# Patient Record
Sex: Male | Born: 1935 | Race: White | Hispanic: No | Marital: Married | Smoking: Former smoker
Health system: Southern US, Community
[De-identification: ages and names within clinical notes are randomized; demographics above are authoritative.]

## PROBLEM LIST (undated history)

## (undated) DIAGNOSIS — I10 Essential (primary) hypertension: Secondary | ICD-10-CM

## (undated) DIAGNOSIS — I255 Ischemic cardiomyopathy: Secondary | ICD-10-CM

## (undated) DIAGNOSIS — I472 Ventricular tachycardia, unspecified: Secondary | ICD-10-CM

## (undated) DIAGNOSIS — I4891 Unspecified atrial fibrillation: Secondary | ICD-10-CM

## (undated) DIAGNOSIS — E785 Hyperlipidemia, unspecified: Secondary | ICD-10-CM

## (undated) DIAGNOSIS — R5383 Other fatigue: Secondary | ICD-10-CM

## (undated) DIAGNOSIS — Z9889 Other specified postprocedural states: Secondary | ICD-10-CM

## (undated) DIAGNOSIS — I82409 Acute embolism and thrombosis of unspecified deep veins of unspecified lower extremity: Secondary | ICD-10-CM

## (undated) DIAGNOSIS — I2510 Atherosclerotic heart disease of native coronary artery without angina pectoris: Secondary | ICD-10-CM

## (undated) DIAGNOSIS — M1990 Unspecified osteoarthritis, unspecified site: Secondary | ICD-10-CM

## (undated) DIAGNOSIS — Z95810 Presence of automatic (implantable) cardiac defibrillator: Secondary | ICD-10-CM

## (undated) HISTORY — DX: Atherosclerotic heart disease of native coronary artery without angina pectoris: I25.10

## (undated) HISTORY — DX: Other specified postprocedural states: Z98.890

## (undated) HISTORY — DX: Unspecified atrial fibrillation: I48.91

## (undated) HISTORY — DX: Hyperlipidemia, unspecified: E78.5

## (undated) HISTORY — DX: Ventricular tachycardia: I47.2

## (undated) HISTORY — DX: Ventricular tachycardia, unspecified: I47.20

## (undated) HISTORY — DX: Other fatigue: R53.83

## (undated) HISTORY — DX: Ischemic cardiomyopathy: I25.5

## (undated) HISTORY — DX: Essential (primary) hypertension: I10

## (undated) HISTORY — DX: Presence of automatic (implantable) cardiac defibrillator: Z95.810

## (undated) HISTORY — DX: Acute embolism and thrombosis of unspecified deep veins of unspecified lower extremity: I82.409

---

## 1994-05-23 HISTORY — PX: CORONARY ARTERY BYPASS GRAFT: SHX141

## 1995-05-24 HISTORY — PX: PTCA: SHX146

## 2002-10-14 DIAGNOSIS — Z9889 Other specified postprocedural states: Secondary | ICD-10-CM

## 2002-10-14 HISTORY — DX: Other specified postprocedural states: Z98.890

## 2004-04-21 ENCOUNTER — Ambulatory Visit: Payer: Self-pay | Admitting: Cardiology

## 2004-05-05 ENCOUNTER — Ambulatory Visit: Payer: Self-pay

## 2004-11-22 ENCOUNTER — Ambulatory Visit: Payer: Self-pay | Admitting: Internal Medicine

## 2005-03-11 ENCOUNTER — Ambulatory Visit: Payer: Self-pay | Admitting: Cardiology

## 2005-03-25 ENCOUNTER — Ambulatory Visit: Payer: Self-pay

## 2005-07-27 ENCOUNTER — Ambulatory Visit: Payer: Self-pay | Admitting: Internal Medicine

## 2005-08-01 ENCOUNTER — Ambulatory Visit: Payer: Self-pay | Admitting: Internal Medicine

## 2005-10-25 ENCOUNTER — Ambulatory Visit: Payer: Self-pay | Admitting: Internal Medicine

## 2006-01-20 ENCOUNTER — Ambulatory Visit: Payer: Self-pay | Admitting: Internal Medicine

## 2006-06-28 ENCOUNTER — Ambulatory Visit: Payer: Self-pay | Admitting: Internal Medicine

## 2006-07-22 DIAGNOSIS — Z95810 Presence of automatic (implantable) cardiac defibrillator: Secondary | ICD-10-CM

## 2006-07-22 HISTORY — DX: Presence of automatic (implantable) cardiac defibrillator: Z95.810

## 2006-07-22 HISTORY — PX: CARDIAC DEFIBRILLATOR PLACEMENT: SHX171

## 2006-08-03 ENCOUNTER — Ambulatory Visit: Payer: Self-pay | Admitting: Internal Medicine

## 2006-08-08 ENCOUNTER — Ambulatory Visit: Payer: Self-pay | Admitting: Cardiology

## 2006-08-13 ENCOUNTER — Encounter: Payer: Self-pay | Admitting: Internal Medicine

## 2006-08-13 ENCOUNTER — Ambulatory Visit: Payer: Self-pay | Admitting: Cardiology

## 2006-08-13 ENCOUNTER — Inpatient Hospital Stay (HOSPITAL_COMMUNITY): Admission: EM | Admit: 2006-08-13 | Discharge: 2006-08-16 | Payer: Self-pay | Admitting: Emergency Medicine

## 2006-09-04 ENCOUNTER — Inpatient Hospital Stay (HOSPITAL_COMMUNITY): Admission: AD | Admit: 2006-09-04 | Discharge: 2006-09-08 | Payer: Self-pay | Admitting: Cardiology

## 2006-09-04 ENCOUNTER — Ambulatory Visit: Payer: Self-pay | Admitting: Cardiology

## 2006-09-04 ENCOUNTER — Ambulatory Visit: Payer: Self-pay

## 2006-09-11 ENCOUNTER — Ambulatory Visit: Payer: Self-pay | Admitting: Cardiology

## 2006-09-18 ENCOUNTER — Ambulatory Visit: Payer: Self-pay | Admitting: Cardiology

## 2006-09-20 ENCOUNTER — Ambulatory Visit: Payer: Self-pay | Admitting: Internal Medicine

## 2006-09-25 ENCOUNTER — Ambulatory Visit: Payer: Self-pay | Admitting: Cardiovascular Disease

## 2006-10-02 ENCOUNTER — Ambulatory Visit: Payer: Self-pay | Admitting: Internal Medicine

## 2006-10-11 ENCOUNTER — Ambulatory Visit: Payer: Self-pay | Admitting: Cardiology

## 2006-10-25 ENCOUNTER — Ambulatory Visit: Payer: Self-pay | Admitting: Cardiology

## 2006-11-17 ENCOUNTER — Ambulatory Visit: Payer: Self-pay | Admitting: Cardiology

## 2006-11-22 ENCOUNTER — Ambulatory Visit: Payer: Self-pay | Admitting: Cardiology

## 2006-12-12 ENCOUNTER — Ambulatory Visit: Payer: Self-pay | Admitting: Internal Medicine

## 2007-01-02 ENCOUNTER — Ambulatory Visit: Payer: Self-pay | Admitting: Cardiology

## 2007-01-11 ENCOUNTER — Ambulatory Visit: Payer: Self-pay | Admitting: Cardiology

## 2007-01-18 ENCOUNTER — Ambulatory Visit: Payer: Self-pay | Admitting: Internal Medicine

## 2007-01-18 LAB — CONVERTED CEMR LAB
BUN: 29 mg/dL — ABNORMAL HIGH (ref 6–23)
CO2: 28 meq/L (ref 19–32)
Calcium: 9.6 mg/dL (ref 8.4–10.5)
Chloride: 103 meq/L (ref 96–112)
Creatinine, Ser: 1.1 mg/dL (ref 0.4–1.5)
GFR calc Af Amer: 85 mL/min
GFR calc non Af Amer: 70 mL/min
Glucose, Bld: 95 mg/dL (ref 70–99)
Magnesium: 2.3 mg/dL (ref 1.5–2.5)
Potassium: 4.6 meq/L (ref 3.5–5.1)
Sodium: 138 meq/L (ref 135–145)
TSH: 0.74 microintl units/mL (ref 0.35–5.50)

## 2007-02-05 ENCOUNTER — Encounter: Payer: Self-pay | Admitting: Internal Medicine

## 2007-02-05 DIAGNOSIS — I10 Essential (primary) hypertension: Secondary | ICD-10-CM

## 2007-02-05 DIAGNOSIS — E785 Hyperlipidemia, unspecified: Secondary | ICD-10-CM | POA: Insufficient documentation

## 2007-02-05 DIAGNOSIS — I82409 Acute embolism and thrombosis of unspecified deep veins of unspecified lower extremity: Secondary | ICD-10-CM | POA: Insufficient documentation

## 2007-02-08 ENCOUNTER — Ambulatory Visit: Payer: Self-pay | Admitting: Internal Medicine

## 2007-02-22 ENCOUNTER — Ambulatory Visit: Payer: Self-pay | Admitting: Cardiology

## 2007-03-08 ENCOUNTER — Ambulatory Visit: Payer: Self-pay | Admitting: Cardiology

## 2007-03-26 ENCOUNTER — Ambulatory Visit: Payer: Self-pay | Admitting: Cardiovascular Disease

## 2007-03-26 ENCOUNTER — Ambulatory Visit: Payer: Self-pay

## 2007-04-12 ENCOUNTER — Ambulatory Visit: Payer: Self-pay | Admitting: Cardiology

## 2007-04-23 ENCOUNTER — Ambulatory Visit: Payer: Self-pay | Admitting: Cardiology

## 2007-05-08 ENCOUNTER — Ambulatory Visit: Payer: Self-pay | Admitting: Cardiology

## 2007-05-28 ENCOUNTER — Ambulatory Visit: Payer: Self-pay | Admitting: Cardiology

## 2007-05-28 ENCOUNTER — Encounter: Payer: Self-pay | Admitting: Internal Medicine

## 2007-05-28 LAB — CONVERTED CEMR LAB: Uric Acid, Serum: 7.1 mg/dL — ABNORMAL HIGH (ref 2.4–7.0)

## 2007-06-05 ENCOUNTER — Ambulatory Visit: Payer: Self-pay | Admitting: Cardiology

## 2007-06-06 ENCOUNTER — Ambulatory Visit: Payer: Self-pay | Admitting: Internal Medicine

## 2007-07-03 ENCOUNTER — Ambulatory Visit: Payer: Self-pay | Admitting: Internal Medicine

## 2007-07-24 ENCOUNTER — Ambulatory Visit: Payer: Self-pay | Admitting: Internal Medicine

## 2007-08-21 ENCOUNTER — Ambulatory Visit: Payer: Self-pay | Admitting: Cardiovascular Disease

## 2007-09-18 ENCOUNTER — Ambulatory Visit: Payer: Self-pay | Admitting: Cardiology

## 2007-09-24 ENCOUNTER — Ambulatory Visit: Payer: Self-pay | Admitting: Internal Medicine

## 2007-10-12 ENCOUNTER — Ambulatory Visit: Payer: Self-pay | Admitting: Cardiovascular Disease

## 2007-10-12 ENCOUNTER — Ambulatory Visit: Payer: Self-pay | Admitting: Internal Medicine

## 2007-10-24 ENCOUNTER — Ambulatory Visit: Payer: Self-pay | Admitting: Internal Medicine

## 2007-10-24 DIAGNOSIS — I4891 Unspecified atrial fibrillation: Secondary | ICD-10-CM | POA: Insufficient documentation

## 2007-10-24 DIAGNOSIS — K5730 Diverticulosis of large intestine without perforation or abscess without bleeding: Secondary | ICD-10-CM | POA: Insufficient documentation

## 2007-11-09 ENCOUNTER — Ambulatory Visit: Payer: Self-pay | Admitting: Cardiology

## 2007-11-19 ENCOUNTER — Ambulatory Visit: Payer: Self-pay | Admitting: Cardiology

## 2007-12-10 ENCOUNTER — Ambulatory Visit: Payer: Self-pay | Admitting: Cardiology

## 2007-12-17 ENCOUNTER — Ambulatory Visit: Payer: Self-pay | Admitting: Cardiology

## 2008-01-08 ENCOUNTER — Ambulatory Visit: Payer: Self-pay | Admitting: Cardiology

## 2008-01-08 ENCOUNTER — Ambulatory Visit: Payer: Self-pay | Admitting: Internal Medicine

## 2008-01-08 LAB — CONVERTED CEMR LAB
Basophils Absolute: 0 10*3/uL (ref 0.0–0.1)
Eosinophils Absolute: 0.1 10*3/uL (ref 0.0–0.7)
Eosinophils Relative: 2.4 % (ref 0.0–5.0)
Lymphocytes Relative: 14.6 % (ref 12.0–46.0)
MCV: 93.7 fL (ref 78.0–100.0)
Neutrophils Relative %: 74.1 % (ref 43.0–77.0)
Platelets: 224 10*3/uL (ref 150–400)
T3, Free: 2.7 pg/mL (ref 2.3–4.2)
TSH: 1.15 microintl units/mL (ref 0.35–5.50)
WBC: 6.2 10*3/uL (ref 4.5–10.5)

## 2008-01-15 ENCOUNTER — Ambulatory Visit: Payer: Self-pay | Admitting: Cardiology

## 2008-01-15 LAB — CONVERTED CEMR LAB
ALT: 18 units/L (ref 0–53)
AST: 20 units/L (ref 0–37)
Bilirubin, Direct: 0.1 mg/dL (ref 0.0–0.3)
HDL: 42.4 mg/dL (ref >39.0)
Total Bilirubin: 0.6 mg/dL (ref 0.3–1.2)

## 2008-01-16 ENCOUNTER — Ambulatory Visit: Payer: Self-pay | Admitting: Internal Medicine

## 2008-02-05 ENCOUNTER — Ambulatory Visit: Payer: Self-pay | Admitting: Cardiovascular Disease

## 2008-02-13 ENCOUNTER — Ambulatory Visit: Payer: Self-pay | Admitting: Internal Medicine

## 2008-02-13 LAB — CONVERTED CEMR LAB
LDL Cholesterol: 124 mg/dL — ABNORMAL HIGH (ref 0–99)
Total CHOL/HDL Ratio: 3.9

## 2008-02-14 ENCOUNTER — Encounter: Payer: Self-pay | Admitting: Internal Medicine

## 2008-02-28 ENCOUNTER — Ambulatory Visit: Payer: Self-pay | Admitting: Cardiology

## 2008-03-13 ENCOUNTER — Ambulatory Visit: Payer: Self-pay

## 2008-03-13 ENCOUNTER — Ambulatory Visit: Payer: Self-pay | Admitting: Cardiology

## 2008-03-27 ENCOUNTER — Ambulatory Visit: Payer: Self-pay | Admitting: Cardiovascular Disease

## 2008-04-07 ENCOUNTER — Ambulatory Visit: Payer: Self-pay | Admitting: Cardiology

## 2008-04-10 ENCOUNTER — Ambulatory Visit: Payer: Self-pay | Admitting: Internal Medicine

## 2008-05-01 ENCOUNTER — Ambulatory Visit: Payer: Self-pay | Admitting: Cardiology

## 2008-05-19 ENCOUNTER — Ambulatory Visit: Payer: Self-pay | Admitting: Cardiology

## 2008-05-29 ENCOUNTER — Ambulatory Visit: Payer: Self-pay | Admitting: Cardiology

## 2008-06-12 ENCOUNTER — Ambulatory Visit: Payer: Self-pay | Admitting: Internal Medicine

## 2008-06-16 ENCOUNTER — Ambulatory Visit: Payer: Self-pay | Admitting: Internal Medicine

## 2008-07-03 ENCOUNTER — Ambulatory Visit: Payer: Self-pay | Admitting: Cardiovascular Disease

## 2008-07-21 ENCOUNTER — Ambulatory Visit: Payer: Self-pay | Admitting: Cardiovascular Disease

## 2008-08-04 ENCOUNTER — Ambulatory Visit: Payer: Self-pay | Admitting: Cardiovascular Disease

## 2008-08-19 ENCOUNTER — Encounter: Payer: Self-pay | Admitting: Internal Medicine

## 2008-08-26 ENCOUNTER — Encounter: Payer: Self-pay | Admitting: Cardiology

## 2008-08-26 ENCOUNTER — Ambulatory Visit: Payer: Self-pay | Admitting: Cardiology

## 2008-08-26 ENCOUNTER — Ambulatory Visit: Payer: Self-pay | Admitting: Cardiovascular Disease

## 2008-08-27 LAB — CONVERTED CEMR LAB
Chloride: 104 meq/L (ref 96–112)
Potassium: 4.3 meq/L (ref 3.5–5.1)
TSH: 1 microintl units/mL (ref 0.35–5.50)

## 2008-09-15 ENCOUNTER — Ambulatory Visit: Payer: Self-pay | Admitting: Internal Medicine

## 2008-09-18 ENCOUNTER — Encounter (INDEPENDENT_AMBULATORY_CARE_PROVIDER_SITE_OTHER): Payer: Self-pay | Admitting: *Deleted

## 2008-09-22 ENCOUNTER — Inpatient Hospital Stay (HOSPITAL_COMMUNITY): Admission: EM | Admit: 2008-09-22 | Discharge: 2008-09-24 | Payer: Self-pay | Admitting: Emergency Medicine

## 2008-09-22 ENCOUNTER — Telehealth: Payer: Self-pay | Admitting: Cardiology

## 2008-09-22 ENCOUNTER — Ambulatory Visit: Payer: Self-pay | Admitting: Cardiology

## 2008-09-23 ENCOUNTER — Encounter: Payer: Self-pay | Admitting: Internal Medicine

## 2008-10-01 ENCOUNTER — Ambulatory Visit: Payer: Self-pay | Admitting: Cardiology

## 2008-10-08 ENCOUNTER — Ambulatory Visit: Payer: Self-pay | Admitting: Cardiology

## 2008-10-17 ENCOUNTER — Encounter: Payer: Self-pay | Admitting: Internal Medicine

## 2008-10-21 ENCOUNTER — Encounter: Payer: Self-pay | Admitting: *Deleted

## 2008-10-24 ENCOUNTER — Ambulatory Visit: Payer: Self-pay

## 2008-10-24 ENCOUNTER — Ambulatory Visit: Payer: Self-pay | Admitting: Internal Medicine

## 2008-10-24 ENCOUNTER — Encounter: Payer: Self-pay | Admitting: Internal Medicine

## 2008-10-24 LAB — CONVERTED CEMR LAB
POC INR: 2.6
Protime: 19.5

## 2008-10-26 ENCOUNTER — Encounter: Payer: Self-pay | Admitting: Internal Medicine

## 2008-11-01 ENCOUNTER — Encounter: Payer: Self-pay | Admitting: Internal Medicine

## 2008-11-04 ENCOUNTER — Telehealth (INDEPENDENT_AMBULATORY_CARE_PROVIDER_SITE_OTHER): Payer: Self-pay | Admitting: *Deleted

## 2008-11-18 ENCOUNTER — Ambulatory Visit: Payer: Self-pay | Admitting: Internal Medicine

## 2008-11-18 DIAGNOSIS — I472 Ventricular tachycardia: Secondary | ICD-10-CM

## 2008-11-18 LAB — CONVERTED CEMR LAB
POC INR: 2.4
Prothrombin Time: 19 s

## 2008-11-19 ENCOUNTER — Telehealth: Payer: Self-pay | Admitting: Cardiology

## 2008-11-26 ENCOUNTER — Encounter: Payer: Self-pay | Admitting: *Deleted

## 2008-12-10 ENCOUNTER — Telehealth: Payer: Self-pay | Admitting: Internal Medicine

## 2008-12-11 ENCOUNTER — Telehealth (INDEPENDENT_AMBULATORY_CARE_PROVIDER_SITE_OTHER): Payer: Self-pay | Admitting: *Deleted

## 2008-12-16 ENCOUNTER — Ambulatory Visit: Payer: Self-pay | Admitting: Cardiology

## 2008-12-19 ENCOUNTER — Ambulatory Visit: Payer: Self-pay | Admitting: Internal Medicine

## 2008-12-19 ENCOUNTER — Inpatient Hospital Stay (HOSPITAL_COMMUNITY): Admission: AD | Admit: 2008-12-19 | Discharge: 2008-12-22 | Payer: Self-pay | Admitting: Internal Medicine

## 2008-12-19 ENCOUNTER — Telehealth (INDEPENDENT_AMBULATORY_CARE_PROVIDER_SITE_OTHER): Payer: Self-pay | Admitting: Cardiology

## 2008-12-29 ENCOUNTER — Telehealth: Payer: Self-pay | Admitting: Internal Medicine

## 2008-12-30 ENCOUNTER — Ambulatory Visit: Payer: Self-pay | Admitting: Internal Medicine

## 2009-01-06 ENCOUNTER — Ambulatory Visit: Payer: Self-pay | Admitting: Cardiology

## 2009-01-22 ENCOUNTER — Ambulatory Visit: Payer: Self-pay | Admitting: Cardiology

## 2009-01-22 ENCOUNTER — Ambulatory Visit: Payer: Self-pay | Admitting: Internal Medicine

## 2009-01-28 ENCOUNTER — Telehealth: Payer: Self-pay | Admitting: Internal Medicine

## 2009-02-19 ENCOUNTER — Ambulatory Visit: Payer: Self-pay | Admitting: Cardiology

## 2009-02-23 ENCOUNTER — Ambulatory Visit: Payer: Self-pay | Admitting: Internal Medicine

## 2009-03-12 ENCOUNTER — Encounter: Payer: Self-pay | Admitting: Internal Medicine

## 2009-03-19 ENCOUNTER — Ambulatory Visit: Payer: Self-pay | Admitting: Cardiovascular Disease

## 2009-04-09 ENCOUNTER — Ambulatory Visit: Payer: Self-pay | Admitting: Internal Medicine

## 2009-04-09 LAB — CONVERTED CEMR LAB: POC INR: 2.1

## 2009-04-24 ENCOUNTER — Ambulatory Visit: Payer: Self-pay | Admitting: Internal Medicine

## 2009-04-24 LAB — CONVERTED CEMR LAB
AST: 24 units/L (ref 0–37)
Albumin: 4 g/dL (ref 3.5–5.2)
CO2: 29 meq/L (ref 19–32)
Calcium: 9 mg/dL (ref 8.4–10.5)
Chloride: 102 meq/L (ref 96–112)
HDL: 38.6 mg/dL — ABNORMAL LOW (ref >39.00)
Sodium: 138 meq/L (ref 135–145)
TSH: 1.1 microintl units/mL (ref 0.35–5.50)
Total CHOL/HDL Ratio: 4
Total Protein: 7.1 g/dL (ref 6.0–8.3)
Triglycerides: 172 mg/dL — ABNORMAL HIGH (ref 0.0–149.0)

## 2009-04-28 ENCOUNTER — Telehealth: Payer: Self-pay | Admitting: Internal Medicine

## 2009-05-08 ENCOUNTER — Ambulatory Visit: Payer: Self-pay | Admitting: Cardiovascular Disease

## 2009-05-25 ENCOUNTER — Encounter: Payer: Self-pay | Admitting: Internal Medicine

## 2009-05-26 ENCOUNTER — Ambulatory Visit: Payer: Self-pay | Admitting: Internal Medicine

## 2009-05-28 ENCOUNTER — Telehealth: Payer: Self-pay | Admitting: Internal Medicine

## 2009-06-05 ENCOUNTER — Ambulatory Visit: Payer: Self-pay | Admitting: Cardiology

## 2009-06-05 ENCOUNTER — Telehealth: Payer: Self-pay | Admitting: Internal Medicine

## 2009-06-08 ENCOUNTER — Telehealth: Payer: Self-pay | Admitting: Internal Medicine

## 2009-06-23 ENCOUNTER — Ambulatory Visit: Payer: Self-pay | Admitting: Internal Medicine

## 2009-07-01 ENCOUNTER — Telehealth (INDEPENDENT_AMBULATORY_CARE_PROVIDER_SITE_OTHER): Payer: Self-pay | Admitting: *Deleted

## 2009-07-02 ENCOUNTER — Encounter (HOSPITAL_COMMUNITY): Admission: RE | Admit: 2009-07-02 | Discharge: 2009-08-26 | Payer: Self-pay | Admitting: Internal Medicine

## 2009-07-02 ENCOUNTER — Ambulatory Visit: Payer: Self-pay | Admitting: Cardiology

## 2009-07-02 ENCOUNTER — Encounter: Payer: Self-pay | Admitting: Cardiology

## 2009-07-02 ENCOUNTER — Encounter: Payer: Self-pay | Admitting: Internal Medicine

## 2009-07-02 ENCOUNTER — Ambulatory Visit: Payer: Self-pay

## 2009-07-02 LAB — CONVERTED CEMR LAB: POC INR: 2.5

## 2009-07-21 ENCOUNTER — Ambulatory Visit: Payer: Self-pay | Admitting: Internal Medicine

## 2009-07-30 ENCOUNTER — Ambulatory Visit: Payer: Self-pay | Admitting: Cardiovascular Disease

## 2009-08-10 ENCOUNTER — Encounter: Payer: Self-pay | Admitting: Internal Medicine

## 2009-08-20 ENCOUNTER — Encounter: Payer: Self-pay | Admitting: Internal Medicine

## 2009-08-24 ENCOUNTER — Ambulatory Visit: Payer: Self-pay | Admitting: Internal Medicine

## 2009-08-27 ENCOUNTER — Ambulatory Visit: Payer: Self-pay | Admitting: Cardiology

## 2009-09-22 ENCOUNTER — Ambulatory Visit: Payer: Self-pay | Admitting: Internal Medicine

## 2009-09-22 ENCOUNTER — Ambulatory Visit: Payer: Self-pay | Admitting: Cardiology

## 2009-09-28 ENCOUNTER — Encounter: Payer: Self-pay | Admitting: Internal Medicine

## 2009-10-02 ENCOUNTER — Telehealth: Payer: Self-pay | Admitting: Internal Medicine

## 2009-10-20 ENCOUNTER — Ambulatory Visit: Payer: Self-pay | Admitting: Cardiology

## 2009-10-21 ENCOUNTER — Telehealth: Payer: Self-pay | Admitting: Internal Medicine

## 2009-11-16 ENCOUNTER — Ambulatory Visit: Payer: Self-pay | Admitting: Cardiology

## 2009-11-16 LAB — CONVERTED CEMR LAB: POC INR: 1.9

## 2009-12-07 ENCOUNTER — Ambulatory Visit: Payer: Self-pay | Admitting: Cardiovascular Disease

## 2009-12-07 LAB — CONVERTED CEMR LAB: POC INR: 2.4

## 2009-12-22 ENCOUNTER — Ambulatory Visit: Payer: Self-pay | Admitting: Internal Medicine

## 2010-01-04 ENCOUNTER — Ambulatory Visit: Payer: Self-pay | Admitting: Cardiology

## 2010-01-04 LAB — CONVERTED CEMR LAB: POC INR: 2.6

## 2010-01-05 ENCOUNTER — Telehealth: Payer: Self-pay | Admitting: Internal Medicine

## 2010-01-08 ENCOUNTER — Encounter: Payer: Self-pay | Admitting: Internal Medicine

## 2010-01-11 ENCOUNTER — Telehealth: Payer: Self-pay | Admitting: Internal Medicine

## 2010-02-09 ENCOUNTER — Ambulatory Visit: Payer: Self-pay | Admitting: Cardiology

## 2010-02-09 LAB — CONVERTED CEMR LAB: POC INR: 2.9

## 2010-02-10 ENCOUNTER — Ambulatory Visit: Payer: Self-pay | Admitting: Internal Medicine

## 2010-02-10 LAB — CONVERTED CEMR LAB
ALT: 18 units/L (ref 0–53)
Alkaline Phosphatase: 85 units/L (ref 39–117)
Bilirubin, Direct: 0.1 mg/dL (ref 0.0–0.3)
Cholesterol: 121 mg/dL (ref 0–200)
Total Bilirubin: 0.4 mg/dL (ref 0.3–1.2)
Total Protein: 6.6 g/dL (ref 6.0–8.3)

## 2010-02-15 ENCOUNTER — Encounter: Payer: Self-pay | Admitting: Internal Medicine

## 2010-02-16 ENCOUNTER — Telehealth (INDEPENDENT_AMBULATORY_CARE_PROVIDER_SITE_OTHER): Payer: Self-pay | Admitting: *Deleted

## 2010-03-09 ENCOUNTER — Ambulatory Visit: Payer: Self-pay | Admitting: Internal Medicine

## 2010-03-25 ENCOUNTER — Ambulatory Visit: Payer: Self-pay | Admitting: Internal Medicine

## 2010-03-30 ENCOUNTER — Ambulatory Visit: Payer: Self-pay | Admitting: Cardiology

## 2010-04-22 ENCOUNTER — Ambulatory Visit: Payer: Self-pay | Admitting: Cardiology

## 2010-04-23 ENCOUNTER — Ambulatory Visit: Payer: Self-pay | Admitting: Internal Medicine

## 2010-04-23 LAB — CONVERTED CEMR LAB
BUN: 23 mg/dL (ref 6–23)
Bilirubin Urine: NEGATIVE
Chloride: 99 meq/L (ref 96–112)
Eosinophils Relative: 0.3 % (ref 0.0–5.0)
GFR calc non Af Amer: 65.34 mL/min (ref >60)
Glucose, Bld: 116 mg/dL — ABNORMAL HIGH (ref 70–99)
Glucose, Urine, Semiquant: NEGATIVE
HCT: 38.1 % — ABNORMAL LOW (ref 39.0–52.0)
Hemoglobin: 13 g/dL (ref 13.0–17.0)
Ketones, ur: NEGATIVE mg/dL
Ketones, urine, test strip: NEGATIVE
Lymphs Abs: 0.4 10*3/uL — ABNORMAL LOW (ref 0.7–4.0)
MCV: 93.4 fL (ref 78.0–100.0)
Monocytes Absolute: 0.6 10*3/uL (ref 0.1–1.0)
Monocytes Relative: 5.1 % (ref 3.0–12.0)
Neutro Abs: 10.3 10*3/uL — ABNORMAL HIGH (ref 1.4–7.7)
Nitrite: POSITIVE
Nitrite: POSITIVE
Platelets: 158 10*3/uL (ref 150.0–400.0)
Potassium: 4.7 meq/L (ref 3.5–5.1)
Sodium: 136 meq/L (ref 135–145)
Total Protein, Urine: 100 mg/dL
Urine Glucose: NEGATIVE mg/dL
Urobilinogen, UA: 0.2
WBC: 11.4 10*3/uL — ABNORMAL HIGH (ref 4.5–10.5)
pH: 6 (ref 5.0–8.0)

## 2010-04-24 ENCOUNTER — Telehealth: Payer: Self-pay | Admitting: Internal Medicine

## 2010-04-25 ENCOUNTER — Telehealth: Payer: Self-pay | Admitting: Internal Medicine

## 2010-05-06 ENCOUNTER — Encounter (INDEPENDENT_AMBULATORY_CARE_PROVIDER_SITE_OTHER): Payer: Self-pay | Admitting: *Deleted

## 2010-05-13 ENCOUNTER — Ambulatory Visit: Payer: Self-pay | Admitting: Cardiology

## 2010-05-13 LAB — CONVERTED CEMR LAB: POC INR: 1.5

## 2010-06-03 ENCOUNTER — Ambulatory Visit: Admission: RE | Admit: 2010-06-03 | Discharge: 2010-06-03 | Payer: Self-pay | Source: Home / Self Care

## 2010-06-03 LAB — CONVERTED CEMR LAB
INR: 3.5
POC INR: 3.5

## 2010-06-20 LAB — CONVERTED CEMR LAB
BUN: 14 mg/dL (ref 6–23)
CO2: 29 meq/L (ref 19–32)
Chloride: 103 meq/L (ref 96–112)
Chloride: 105 meq/L (ref 96–112)
Glucose, Bld: 92 mg/dL (ref 70–99)
Glucose, Bld: 96 mg/dL (ref 70–99)
Magnesium: 2.2 mg/dL (ref 1.5–2.5)
Potassium: 4.4 meq/L (ref 3.5–5.1)
Potassium: 4.8 meq/L (ref 3.5–5.1)
Sodium: 141 meq/L (ref 135–145)

## 2010-06-22 NOTE — Medication Information (Signed)
Summary: rov/cs  Anticoagulant Therapy  Managed by: Reina Fuse, PharmD Referring MD: Odessa Fleming PCP: Illene Regulus MD Supervising MD: Myrtis Ser MD, Tinnie Gens Indication 1: deep vein thrombosis--arm (ICD-451.19) Indication 2: Atrial Fibrillation (ICD-427.31) Lab Used: [facility] Site: [facility] INR POC 3.1 INR RANGE 2 - 3  Dietary changes: no    Health status changes: no    Bleeding/hemorrhagic complications: no    Recent/future hospitalizations: no    Any changes in medication regimen? no    Recent/future dental: no  Any missed doses?: no       Is patient compliant with meds? yes       Allergies: No Known Drug Allergies  Anticoagulation Management History:      The patient is taking warfarin and comes in today for a routine follow up visit.  Positive risk factors for bleeding include an age of 75 years or older.  Negative risk factors for bleeding include no history of CVA/TIA.  The bleeding index is 'intermediate risk'.  Positive CHADS2 values include History of HTN.  Negative CHADS2 values include Age > 85 years old, History of Diabetes, and Prior Stroke/CVA/TIA.  The start date was 09/07/2006.  Anticoagulation responsible provider: Myrtis Ser MD, Tinnie Gens.  INR POC: 3.1.  Cuvette Lot#: [id number].  Exp: 03/2010.    Anticoagulation Management Assessment/Plan:      The patient's current anticoagulation dose is Warfarin sodium 4 mg tabs: Use as directed by Anticoagulation Clinic.  The target INR is 2 - 3.  The next INR is due 04/22/2010.  Anticoagulation instructions were given to patient.  Results were reviewed/authorized by Reina Fuse, PharmD.  He was notified by Reina Fuse PharmD.         Prior Anticoagulation Instructions: INR 3.3  Skip today's dose, then resume normal Coumadin schedule of 1 tablet every day of the week, except 1/2 tablet on Monday and Friday.  Return to clinic in 3 weeks.    Current Anticoagulation Instructions: INR 3.1  Today, Tuesday, November 8th, take  Coumadin 0.5 tab (2 mg). Then, resume taking Coumadin 1 tab (4 mg) all days except Coumadin 0.5 tab (2 mg) on Mondays and Fridays. Return to clinic in 3 weeks.

## 2010-06-22 NOTE — Medication Information (Signed)
Summary: rov/tm  Anticoagulant Therapy  Managed by: Bethena Midget, RN, BSN Referring MD: Rollene Rotunda MD PCP: Illene Regulus MD Supervising MD: Jens Som MD, Arlys John Indication 1: deep vein thrombosis--arm (ICD-451.19) Indication 2: Atrial Fibrillation (ICD-427.31) Lab Used: [facility] Site: [facility] INR POC 2.8 INR RANGE 2 - 3  Dietary changes: no    Health status changes: no    Bleeding/hemorrhagic complications: no    Recent/future hospitalizations: no    Any changes in medication regimen? no    Recent/future dental: no  Any missed doses?: no       Is patient compliant with meds? yes       Allergies (verified): No Known Drug Allergies  Anticoagulation Management History:      The patient is taking warfarin and comes in today for a routine follow up visit.  Positive risk factors for bleeding include an age of 75 years or older.  Negative risk factors for bleeding include no history of CVA/TIA.  The bleeding index is 'intermediate risk'.  Positive CHADS2 values include History of HTN.  Negative CHADS2 values include Age > 36 years old, History of Diabetes, and Prior Stroke/CVA/TIA.  The start date was 09/07/2006.  Anticoagulation responsible provider: Jens Som MD, Arlys John.  INR POC: 2.8.  Cuvette Lot#: [id number].  Exp: 08/2010.    Anticoagulation Management Assessment/Plan:      The patient's current anticoagulation dose is Warfarin sodium 4 mg tabs: Use as directed by Anticoagulation Clinic.  The target INR is 2 - 3.  The next INR is due 07/03/2009.  Anticoagulation instructions were given to patient.  Results were reviewed/authorized by Bethena Midget, RN, BSN.  He was notified by Cloyde Reams RN.         Prior Anticoagulation Instructions: INR 2.2 Continue 2mg s everyday except 4mg s on Tuesdays, Thursdays and Sundays. Recheck in 4 weeks.   Current Anticoagulation Instructions: INR 2.8 Continue 2mg s daily except 4mg s on Tuesdays, Thursdays and Sundays. Recheck in 4  weeks.

## 2010-06-22 NOTE — Cardiovascular Report (Signed)
Summary: Office Visit   Office Visit   Imported By: Roderic Ovens 09/22/2009 16:37:47  _____________________________________________________________________  External Attachment:    Type:   Image     Comment:   External Document

## 2010-06-22 NOTE — Assessment & Plan Note (Signed)
Summary: [name]   Primary Provider:  Illene Regulus MD   History of Present Illness: [name] is seen in followup for ventriucular tachycardia and atrial fibrillation s/p ICD implantation with both appropriate and inappropriate therapy.  He has a history of ischemic heart disease with prior CABG and moderate depression of LV systolic function.   Tikosyn was started a few months ago and he is tolerating this with less atrial fibrillation. his major side effect his flatulence   I saw him in the hospital a couple of weeks ago when he was visiting some friends. Because of fatigue we decreased his metoprolol. His fatigue is much less.   Reviewing his laboratories his last potassium was 4.3  Current Medications (verified): 1)  Fish Oil 1000 Mg  Caps (Omega-3 Fatty Acids) .... Take One Tablet Twice Daily 2)  Altace 5 Mg  Caps (Ramipril) .... Take One Tablet Daily 3)  Nitrostat 0.4 Mg Subl (Nitroglycerin) .... Dissolve 1 Tab Under Tongue As Needed For Chest Pain 4)  Multivitamins   Tabs (Multiple Vitamin) .... Take 1 Tablet By Mouth Once A Day 5)  Metoprolol Tartrate 100 Mg  Tabs (Metoprolol Tartrate) .... One By Mouth Two Times A Day 6)  Diltiazem Hcl Er Beads 120 Mg Xr24h-Cap (Diltiazem Hcl Er Beads) .... Take One Capsule By Mouth Daily 7)  Digoxin 0.25 Mg Tabs (Digoxin) .... Take 1 Tablet By Mouth Once A Day 8)  Warfarin Sodium 4 Mg Tabs (Warfarin Sodium) .... Use As Directed By Anticoagulation Clinic 9)  Simvastatin 80 Mg Tabs (Simvastatin) .... Take 1 Tablet By Mouth Once A Day 10)  Tikosyn 500 Mcg Caps (Dofetilide) .... Two Times A Day/ Please Rush Order  Allergies (verified): No Known Drug Allergies  Past History:  Past Medical History: Last updated: 08/06/2008 Hx of DVT (ICD-453.40) CORONARY ARTERY DISEASE (ICD-414.00) (status post two-vessel CABG in 1986 and       PTCA in 1991 and 1997) Hx of CARDIOMYOPATHY (ICD-425.4) (EF 45%) DYSLIPIDEMIA  (ICD-272.4) HYPERTENSION (ICD-401.9) Hx of MYOCARDIAL INFARCTION (ICD-410.90) Atrial Fibrillation Ventricular tachycardia Satus post defibrillator  Past Surgical History: Last updated: 08/06/2008 IMPLANTATION OF DEFIBRILLATOR, HX OF (ICD-V45.02) March 08 --- [employer] PERCUTANEOUS TRANSLUMINAL CORONARY ANGIOPLASTY, HX OF (ICD-V45.82) 1997 CORONARY ARTERY BYPASS GRAFT, THREE VESSEL, HX OF (ICD-V45.81) 1996 (last cath reports an atretic LIMA to the LAD and patent SVG to RCA)  Family History: Last updated: 04/24/2009 Family History of Diabetes: mother and brother, paternal grandfather No FH of Colon Cancer: Father died at age 77, patient uncertain of cause. Mother living born 80: alert, lives independently He denies any known history of coronary artery disease in first degree relatives.  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  - cigars - quit smoking cigerettes in 1996  Vital Signs:  Patient profile:   77 year old male Height:      70 inches Weight:      201 pounds BMI:     28.94 Pulse rate:   68 / minute Pulse rhythm:   regular BP sitting:   126 / 79  (left arm) Cuff size:   large  Vitals Entered By: Judithe Modest CMA (Sep 22, 2009 11:20 AM)  Physical Exam  General:  The patient was alert and oriented in no acute distress. HEENT Normal.  Neck veins were flat, carotids were brisk.  Lungs were clear.  Heart sounds were regular without  murmurs or gallops.  Abdomen was soft with active bowel sounds. There is no clubbing cyanosis or edema. Skin Warm and dry     ICD Specifications Following MD:  Sherryl Manges, MD     Referring MD:  [facility] ICD Vendor:  [employer]     ICD Model Number:  [id number]     ICD Serial Number:  [id number] ICD DOI:  08/15/2006     ICD Implanting MD:  Sherryl Manges, MD  Lead 1:    Location: RA     DOI: 08/15/2006     Model #: [id number]     Serial #: [id number]      Status: active Lead 2:    Location: RV     DOI: 08/15/2006     Model #: [id number]     Serial #: [id number]     Status: active  Indications::  ICM, VT   ICD Follow Up Remote Check?  No Battery Voltage:  2.98 V     Charge Time:  11.1 seconds     Battery Est. Longevity:  4.2-4.5 yrs Underlying rhythm:  SR ICD Dependent:  No       ICD Device Measurements Atrium:  Amplitude: 1.8 mV, Impedance: 480 ohms, Threshold: 0.75 V at 0.5 msec Right Ventricle:  Amplitude: 11.9 mV, Impedance: 380 ohms, Threshold: 0.75 V at 0.5 msec Shock Impedance: 39 ohms   Episodes MS Episodes:  18     Percent Mode Switch:  <1%     Coumadin:  Yes Shock:  0     ATP:  2     Nonsustained:  0      Brady Parameters Mode DDDR     Lower Rate Limit:  60     Upper Rate Limit 120 PAV 200     Sensed AV Delay:  200  Tachy Zones VF:  222     VT:  190     VT1:  139     Next Remote Date:  12/24/2009     Tech Comments:  LONGEST AMS EPISODE WAS 2 HRS 21 MINUTES.  2 VT EPISODES W/ATP THERAPY.  NORMAL DEVICE FUNCTION. NO CHANGES MADE.  MERLIN CHECK 12-24-09. Vella Kohler  Impression & Recommendations:  Problem # 1:  VENTRICULAR TACHYCARDIA (ICD-427.1) The patient has had 2 recurrent episodes of ventricular tachycardia one of the context of atrial fibrillation in the context of sinus rhythm. Both were terminated with antitachycardia pacing  Orders: EKG w/ Interpretation (93000)  Problem # 2:  CARDIOMYOPATHY, ISCHEMIC S/P CABG (ICD-414.8) stable on current medications  Problem # 3:  FIBRILLATION, ATRIAL (ICD-427.31) the patient has been having primarily sinus rhythm. He does however take a consent. We will need to measure his potassium and his magnesium levels today. His updated medication list for this problem includes:    Metoprolol Tartrate 100 Mg Tabs (Metoprolol tartrate) ..... One by mouth two times a day    Digoxin 0.25 Mg Tabs (Digoxin) .[name]... Take 1 tablet by mouth once a day    Warfarin Sodium 4 Mg Tabs (Warfarin  sodium) ..... Use as directed by anticoagulation clinic    Tikosyn 500 Mcg Caps (Dofetilide) .[name]..[name] Two times a day/ please rush order  Problem # 4:  IMPLANTATION OF DEFIBRILLATOR, ST JUDE CURRENT DR RF 8657-84 (ICD-V45.02) Device parameters and data were reviewed and no changes were made

## 2010-06-22 NOTE — Miscellaneous (Signed)
Summary: pt instuctions  Clinical Lists Changes  Orders: Added new Test order of TLB-BMP (Basic Metabolic Panel-BMET) (80048-METABOL) - Signed Added new Test order of TLB-Magnesium (Mg) (83735-MG) - Signed Observations: Added new observation of PI CARDIO: Your physician recommends that you schedule a follow-up appointment in: 6months with Dr Graciela Husbands (09/22/2009 12:33)      Patient Instructions: 1)  Your physician recommends that you schedule a follow-up appointment in: 6months with Dr Graciela Husbands

## 2010-06-22 NOTE — Cardiovascular Report (Signed)
Summary: Office Visit Remote  Office Visit Remote   Imported By: Roderic Ovens 09/09/2009 14:20:50  _____________________________________________________________________  External Attachment:    Type:   Image     Comment:   External Document

## 2010-06-22 NOTE — Progress Notes (Signed)
Summary: REFILL  Phone Note Refill Request Message from:  Patient on June 05, 2009 10:47 AM  Refills Requested: Medication #1:  TIKOSYN 500 MCG CAPS two times a day/ Please rush order. [facility] [phone] PT NEEDS ONLY A WEEKS SUPPLY TO HIS MAIL ORDER COMES IN.  Initial call taken by: Judie Grieve,  June 05, 2009 10:48 AM    Prescriptions: TIKOSYN 500 MCG CAPS (DOFETILIDE) two times a day/ Please rush order  #60 x 0   Entered by:   Laurance Flatten CMA   Authorized by:   Nathen May, MD, [facility]   Signed by:   Laurance Flatten CMA on 06/05/2009   Method used:   Electronically to        [facility] [address] [phone]* (retail)       [address]       [place], [place]  [address]       Ph: [phone] or [phone]       Fax: [phone]   RxID:   [id number]

## 2010-06-22 NOTE — Cardiovascular Report (Signed)
Summary: Office Visit Remote   Card Device Clinic   Imported By: Roderic Ovens 08/10/2009 13:49:15  _____________________________________________________________________  External Attachment:    Type:   Image     Comment:   External Document

## 2010-06-22 NOTE — Letter (Signed)
[facility] [address] [place], [place]  [address] Phone: [phone]      February 15, 2010   [facility] [address] [place], [place] [address]  RE:  LAB RESULTS  Dear  [name],  The following is an interpretation of your most recent lab tests.  Please take note of any instructions provided or changes to medications that have resulted from your lab work.  LIVER FUNCTION TESTS:  Good - no changes needed  Health professionals look at cholesterol as more involved than just the total cholesterol. We consider the level of LDL (bad) cholesterol, HDL (good), cholesterol, and Triglycerides (Grease) in the blood.  1. Your LDL should be under 100, and the HDL should be over 45, if you have any vascular disease such as heart attack, angina, stroke, TIA (mini stroke), claudication (pain in the legs when you walk due to poor circulation),  Abdominal Aortic Aneurysm (AAA), diabetes or prediabetes.  2. Your LDL should be under 130 if you have any two of the following:     a. Smoke or chew tobacco,     b. High blood pressure (if you are on medication or over 140/90 without medication),     c. Male gender,    d. HDL below 40,    e. A male relative (father, brother, or son), who have had any vascular event          as described in #1. above under the age of 46, or a male relative (mother,       sister, or daughter) who had an event as described above under age 44. (An HDL over 60 will subtract one risk factor from the total, so if you have two items in # 2 above, but an HDL over 60, you then fall into category # 3 below).  3. Your LDL should be under 160 if you have any one of the above.  Triglycerides should be under 200 with the ideal being under 150.  For diabetes or pre-diabetes, the ideal HgbA1C should be under 6.0%.  If you fall into any of the above categories, you should make a follow up appointment to discuss this with your physician.  LIPID PANEL:   Good - no changes needed Triglyceride: 145.0   Cholesterol: 121   LDL: 60   HDL: 32.20   Chol/HDL%:  4   Good control of lipids.   Please come see me if you have any questions about these lab results.    Sincerely Yours,    Jacques Navy MD  Patient: [name] Note: All result statuses are Final unless otherwise noted.  Tests: (1) Lipid Panel (LIPID)   Cholesterol               121 mg/dL                   2-956     ATP III Classification            Desirable:  < 200 mg/dL                    Borderline High:  200 - 239 mg/dL               High:  > = 240 mg/dL   Triglycerides             145.0 mg/dL  0.0-149.0     Normal:  <150 mg/dL     Borderline High:  829 - 199 mg/dL   HDL                  [L]  56.21 mg/dL                 >30.86   VLDL Cholesterol          29.0 mg/dL                  5.7-84.6   LDL Cholesterol           60 mg/dL                    9-62  CHO/HDL Ratio:  CHD Risk                             4                    Men          Women     1/2 Average Risk     3.4          3.3     Average Risk          5.0          4.4     2X Average Risk          9.6          7.1     3X Average Risk          15.0          11.0                           Tests: (2) Hepatic/Liver Function Panel (HEPATIC)   Total Bilirubin           0.4 mg/dL                   9.5-2.8   Direct Bilirubin          0.1 mg/dL                   4.1-3.2   Alkaline Phosphatase      85 U/L                      39-117   AST                       16 U/L                      0-37   ALT                       18 U/L                      0-53   Total Protein             6.6 g/dL                    4.4-0.1   Albumin                   3.9 g/dL  3.5-5.2  

## 2010-06-22 NOTE — Progress Notes (Signed)
Summary: Metoprolol refill  Phone Note Refill Request Message from:  Fax from Pharmacy on Oct 02, 2009 4:57 PM  Refills Requested: Medication #1:  METOPROLOL TARTRATE 100 MG  TABS One by mouth two times a day   Notes: [facility] Initial call taken by: Lucious Groves,  Oct 02, 2009 4:57 PM    Prescriptions: METOPROLOL TARTRATE 100 MG  TABS (METOPROLOL TARTRATE) One by mouth two times a day  #180 x 3   Entered by:   Lucious Groves   Authorized by:   Jacques Navy MD   Signed by:   Lucious Groves on 10/02/2009   Method used:   Faxed to ...       [facility]* (mail-order)             ,          Ph: [phone]       Fax: [phone]   RxID:   [id number]

## 2010-06-22 NOTE — Letter (Signed)
Summary: Remote Device Check  [facility], Main Office  [address]   [place], [place] [address]   Phone: [phone]  Fax: [phone]     August 10, 2009 MRN: [id number]   [facility] [address] [place], [place]  [address]   Dear Mr. Lickteig,   Your remote transmission was recieved and reviewed by your physician.  All diagnostics were within normal limits for you.    __X____Your next office visit is scheduled for:  MAY 2011 WITH DR Graciela Husbands. Please call our office to schedule an appointment.    Sincerely,  Proofreader

## 2010-06-22 NOTE — Assessment & Plan Note (Signed)
Summary:  Cardiology Nuclear Study  Nuclear Med Background Indications for Stress Test: Evaluation for Ischemia, Graft Patency   History: Angioplasty, CABG, Defibrillator, GXT, Heart Catheterization, Myocardial Infarction, Myocardial Perfusion Study, Pacemaker  History Comments: '86 MI> CABG x2. '91 Angioplasty 12/05 MPS: EF=45%, large inferior defect, (-) ischemia, prior infarct or scar '08 Heart Cath:  '08 Defibrillator: ICM, VT 6/10 GXT  Symptoms: Fatigue, Fatigue with Exertion, Palpitations    Nuclear Pre-Procedure Cardiac Risk Factors: Family History - CAD, History of Smoking, Hypertension, LBBB, Lipids Caffeine/Decaff Intake: None NPO After: 9:00 PM Lungs: Clear IV 0.9% NS with Angio Cath: 22g     IV Site: (R) AC IV Started by: Irean Hong RN Chest Size (in) 44     Height (in): 70 Weight (lb): 202 BMI: 29.09  Nuclear Med Study 1 or 2 day study:  1 day     Stress Test Type:  Adenosine Reading MD:  Marca Ancona, MD     Referring MD:  Odessa Fleming Resting Radionuclide:  Technetium 63m Tetrofosmin     Resting Radionuclide Dose:  11.0 mCi  Stress Radionuclide:  Technetium 42m Tetrofosmin     Stress Radionuclide Dose:  33.0 mCi   Stress Protocol  Dose of Adenosine:  51.4 mg    Stress Test Technologist:  Irean Hong RN     Nuclear Technologist:  Burna Mortimer Deal RT-N  Rest Procedure  Myocardial perfusion imaging was performed at rest 45 minutes following the intravenous administration of Myoview Technetium 42m Tetrofosmin.  Stress Procedure  The patient received IV adenosine at 140 mcg/kg/min for 4 minutes. The EKG was nondiagnostic due to baseline LBBB, occ. PVC's, rare couplet,Pac. The patient complained of chest tightness during infusion. Myoview was injected at the 2 minute mark and quantitative spect images were obtained after a 45 minute delay.  QPS Raw Data Images:  Normal; no motion artifact; normal heart/lung ratio. Stress Images:  Large, severe inferior perfusion  defect.  Rest Images:  Large, severe inferior perfusion defect.  Subtraction (SDS):  Fixed severe inferior perfusion defect.  Transient Ischemic Dilatation:  1.06  (Normal <1.22)  Lung/Heart Ratio:  .22  (Normal <0.45)  Quantitative Gated Spect Images QGS EDV:  181 ml QGS ESV:  93 ml QGS EF:  49 % QGS cine images:  Inferoseptal severe hypokinesis.    Overall Impression  Exercise Capacity: Adenosine study with no exercise. BP Response: Normal blood pressure response. Clinical Symptoms: Chest tightness.  ECG Impression: Left bundle branch block Overall Impression: Severe fixed inferior perfusion defect suggestive of prior MI with minimal ischemia. Mild systolic dysfunction.    Appended Document: Cardiology Nuclear Study Problem # 1:  VENTRICULAR TACHYCARDIA (ICD-427.1) The patient continues to have episodes of ATP terminated ventricular tachycardia. These occur in the absence of clear-cut decompensation in cardio pulmonary function. However, they represent a significant increase in frequency compared to the preceding 12 months.  Please Advise   Appended Document:  Cardiology Nuclear Study looks ok   Appended Document:  Cardiology Nuclear Study Letter mailed to patient with results, no answer.

## 2010-06-22 NOTE — Letter (Signed)
   [facility] [address] [place], [place]  [address] Phone: [phone]      Sep 28, 2009   [facility] [address] [place], [place] [address]  RE:  LAB RESULTS  Dear  Mr. Clink,  The following is an interpretation of your most recent lab tests.  Please take note of any instructions provided or changes to medications that have resulted from your lab work.  ELECTROLYTES:  Good - no changes needed  KIDNEY FUNCTION TESTS:  Good - no changes needed    DIABETIC STUDIES:  Excellent - no changes needed Blood Glucose: 92     Lab results look good.    Sincerely Yours,    Jacques Navy MD  Patient: [name] Note: All result statuses are Final unless otherwise noted.  Tests: (1) BMP (METABOL)   Sodium                    141 mEq/L                   135-145   Potassium                 4.8 mEq/L                   3.5-5.1   Chloride                  103 mEq/L                   96-112   Carbon Dioxide            29 mEq/L                    19-32   Glucose                   92 mg/dL                    18-84   BUN                       14 mg/dL                    1-66   Creatinine                0.9 mg/dL                   0.6-3.0   Calcium                   9.1 mg/dL                   1.6-01.0   GFR                       87.72 mL/min                >60  Tests: (2) Magnesium (MG)   Magnesium                 2.2 mg/dL                   9.3-2.3

## 2010-06-22 NOTE — Progress Notes (Signed)
Summary: TIKOSYN  Phone Note From Pharmacy   Caller: [facility] [phone] REF # [phone] Summary of Call: [facility] called regarding TIKOSYN. They have Dr Debby Bud as the prescriber and Dr Debby Bud is not authorized to write this rx. Informed [facility] that Dr Debby Bud never wrote this rx and gave [facility] the # to cardiology.  Initial call taken by: Lamar Sprinkles, CMA,  June 08, 2009 2:12 PM     Appended Document: Joice Lofts Received another request for this medication.  Called and verbally authorized refill.  Pt has had refill sent into local pharmacy by Laurance Flatten, CMA to get him through until shipment arrives.  Judithe Modest, CMA/AAMA

## 2010-06-22 NOTE — Medication Information (Signed)
Summary: ROV/TM  Anticoagulant Therapy  Managed by: Weston Brass, PharmD Referring MD: Odessa Fleming PCP: Illene Regulus MD Supervising MD: Shirlee Latch MD, Freida Busman Indication 1: deep vein thrombosis--arm (ICD-451.19) Indication 2: Atrial Fibrillation (ICD-427.31) Lab Used: [facility] Site: [facility] INR POC 1.9 INR RANGE 2 - 3  Dietary changes: no    Health status changes: no    Bleeding/hemorrhagic complications: no    Recent/future hospitalizations: no    Any changes in medication regimen? no    Recent/future dental: no  Any missed doses?: no       Is patient compliant with meds? yes       Allergies: No Known Drug Allergies  Anticoagulation Management History:      The patient is taking warfarin and comes in today for a routine follow up visit.  Positive risk factors for bleeding include an age of 75 years or older.  Negative risk factors for bleeding include no history of CVA/TIA.  The bleeding index is 'intermediate risk'.  Positive CHADS2 values include History of HTN.  Negative CHADS2 values include Age > 22 years old, History of Diabetes, and Prior Stroke/CVA/TIA.  The start date was 09/07/2006.  Anticoagulation responsible provider: Shirlee Latch MD, Giulio Bertino.  INR POC: 1.9.  Cuvette Lot#: [id number].  Exp: 12/2010.    Anticoagulation Management Assessment/Plan:      The patient's current anticoagulation dose is Warfarin sodium 4 mg tabs: Use as directed by Anticoagulation Clinic.  The target INR is 2 - 3.  The next INR is due 12/07/2009.  Anticoagulation instructions were given to patient.  Results were reviewed/authorized by Weston Brass, PharmD.  He was notified by Weston Brass PharmD.         Prior Anticoagulation Instructions: INR 1.7 Today take 6mg s then change dose to 4mg s everyday except 2mg s on Mondays, Wednesdays and Fridays.   Recheck in 4 weeks due to vacation  Current Anticoagulation Instructions: INR 1.9  Take 1 tablet today then increase dose to 1 tablet every day except  1/2 tablet on Monday and Friday.

## 2010-06-22 NOTE — Progress Notes (Signed)
Summary: VT on ICD  Phone Note Outgoing Call Call back at [facility] Phone [phone]   Call placed by: Gypsy Balsam RN BSN,  May 28, 2009 4:58 PM Summary of Call: Reviewed Merlin transmission with Dr Graciela Husbands, pt with increasing amount of VT treated with ATP.  Per Dr Graciela Husbands, pt needs appt in 3-4 weeks.  Appt made for Tuesday 06-23-09 with Dr Graciela Husbands.  Pt aware and agrees with plan.  He feels well. Gypsy Balsam RN BSN  May 28, 2009 4:59 PM

## 2010-06-22 NOTE — Cardiovascular Report (Signed)
Summary: Office Visit   Office Visit   Imported By: Roderic Ovens 06/24/2009 13:59:42  _____________________________________________________________________  External Attachment:    Type:   Image     Comment:   External Document

## 2010-06-22 NOTE — Progress Notes (Signed)
Summary: Nuclear Pre-Procedure  Phone Note Outgoing Call Call back at Home Phone [phone]   Call placed by: Stanton Kidney, EMT-P,  July 01, 2009 2:55 PM Call placed to: Patient Action Taken: Phone Call Completed Summary of Call: Reviewed information on Myoview Information Sheet (see scanned document for further details).  Spoke with patient.    Nuclear Med Background Indications for Stress Test: Evaluation for Ischemia, Graft Patency   History: Angioplasty, CABG, Defibrillator, GXT, Heart Catheterization, Myocardial Infarction, Myocardial Perfusion Study  History Comments: '86 CABG x2 '91 Angioplasty 12/05 MPS: EF=45%, large inferior defect, (-) ischemia, prior infarct or scar '08 Heart Cath:  '08 Defibrillator: ICM, VT 6/10 GXT  Symptoms: Palpitations    Nuclear Pre-Procedure Cardiac Risk Factors: History of Smoking, Hypertension, LBBB, Lipids Height (in): 70  Nuclear Med Study Referring MD:  Rollene Rotunda MD

## 2010-06-22 NOTE — Progress Notes (Signed)
Summary: rx Tikosyn..[facility]  Phone Note Refill Request Message from:  Patient on October 21, 2009 11:06 AM  Refills Requested: Medication #1:  TIKOSYN 500 MCG CAPS two times a day/ Please rush order. SEND TO  [facility] [id number]  Initial call taken by: Judie Grieve,  October 21, 2009 11:07 AM    Prescriptions: [name] 500 MCG CAPS (DOFETILIDE) two times a day/ Please rush order  #180 x 3   Entered by:   Danielle Rankin, CMA   Authorized by:   Nathen May, MD, [facility]   Signed by:   Danielle Rankin, CMA on 10/21/2009   Method used:   Electronically to        [facility]* (mail-order)             ,          Ph: [phone]       Fax: [phone]   RxID:   [id number]

## 2010-06-22 NOTE — Letter (Signed)
Summary: Remote Device Check  [facility], Main Office  [address]   [place], [place] [address]   Phone: [phone]  Fax: [phone]     January 08, 2010 MRN: [id number]   [facility] [address] [place], [place]  [address]   Dear Mr. Feltz,   Your remote transmission was recieved and reviewed by your physician.  All diagnostics were within normal limits for you.  __X___Your next transmission is scheduled for:  03-25-2010.  Please transmit at any time this day.  If you have a wireless device your transmission will be sent automatically.  Appointment with Dr Graciela Husbands cancelled for 02-09-10.   Sincerely,  Vella Kohler

## 2010-06-22 NOTE — Medication Information (Signed)
Summary: rov-tp  Anticoagulant Therapy  Managed by: Bethena Midget, RN, BSN Referring MD: Odessa Fleming PCP: Illene Regulus MD Supervising MD: Shirlee Latch MD, Adron Geisel Indication 1: deep vein thrombosis--arm (ICD-451.19) Indication 2: Atrial Fibrillation (ICD-427.31) Lab Used: [facility] Site: [facility] INR POC 1.8 INR RANGE 2 - 3  Dietary changes: no    Health status changes: no    Bleeding/hemorrhagic complications: no    Recent/future hospitalizations: no    Any changes in medication regimen? no    Recent/future dental: no  Any missed doses?: no       Is patient compliant with meds? yes       Allergies: No Known Drug Allergies  Anticoagulation Management History:      The patient is taking warfarin and comes in today for a routine follow up visit.  Positive risk factors for bleeding include an age of 75 years or older.  Negative risk factors for bleeding include no history of CVA/TIA.  The bleeding index is 'intermediate risk'.  Positive CHADS2 values include History of HTN.  Negative CHADS2 values include Age > 75 years old, History of Diabetes, and Prior Stroke/CVA/TIA.  The start date was 09/07/2006.  Anticoagulation responsible provider: Shirlee Latch MD, Jahnai Slingerland.  INR POC: 1.8.  Cuvette Lot#: [id number].  Exp: 09/2010.    Anticoagulation Management Assessment/Plan:      The patient's current anticoagulation dose is Warfarin sodium 4 mg tabs: Use as directed by Anticoagulation Clinic.  The target INR is 2 - 3.  The next INR is due 09/22/2009.  Anticoagulation instructions were given to patient.  Results were reviewed/authorized by Bethena Midget, RN, BSN.  He was notified by Bethena Midget, RN, BSN.         Prior Anticoagulation Instructions: The patient is to continue with the same dose of coumadin.  This dosage includes: 4mg  Sun/Tue/Thu, 2mg  all other days.  Current Anticoagulation Instructions: INR 1.8 Today take 6mg s then resume 2mg s daily except 4mg s on Tuesdays, Thursdays and  Sundays. Recheck in 3 weeks.

## 2010-06-22 NOTE — Assessment & Plan Note (Signed)
Summary: last ov 12/10/very shaky today/#/cd   Vital Signs:  Patient profile:   77 year old male Height:      70 inches Weight:      200 pounds BMI:     28.80 O2 Sat:      97 % on Room air Temp:     99.0 degrees F oral Pulse rate:   69 / minute BP sitting:   120 / 62  (left arm) Cuff size:   regular  Vitals Entered By: Bill Salinas CMA (April 23, 2010 3:45 PM)  O2 Flow:  Room air CC: pt states symptoms of feeling shakey all over which began yesterday about 2:30pm and lasted about 15 min. HE states the second episode came on this morning about 2 am more severe and last about 20 min. he also has c/o burning when urinating/ pt also mentioned right mid to lower back pain/ ab   Primary Care Provider:  Illene Regulus MD  CC:  pt states symptoms of feeling shakey all over which began yesterday about 2:30pm and lasted about 15 min. HE states the second episode came on this morning about 2 am more severe and last about 20 min. he also has c/o burning when urinating/ pt also mentioned right mid to lower back pain/ ab.  History of Present Illness: Patient presents with a 24 hour h/o shaking chills, feeling weak and ill and burning with urination. He has not had N/V. He has not had frank hematuria. He does have some pain at the flanks right greater than left. HE did not document any fever at home. He denies any low back, lower abdomen or perineal pain. No pain with BM.  Current Medications (verified): 1)  Fish Oil 1000 Mg  Caps (Omega-3 Fatty Acids) .... Take One Tablet Twice Daily 2)  Altace 5 Mg  Caps (Ramipril) .... Take One Tablet Daily 3)  Nitrostat 0.4 Mg Subl (Nitroglycerin) .... Dissolve 1 Tab Under Tongue As Needed For Chest Pain 4)  Multivitamins   Tabs (Multiple Vitamin) .... Take 1 Tablet By Mouth Once A Day 5)  Metoprolol Tartrate 100 Mg  Tabs (Metoprolol Tartrate) .... One By Mouth Two Times A Day 6)  Diltiazem Hcl Er Beads 120 Mg Xr24h-Cap (Diltiazem Hcl Er Beads) .... Take One  Capsule By Mouth Daily 7)  Digoxin 0.25 Mg Tabs (Digoxin) .... Take 1 Tablet By Mouth Once A Day 8)  Warfarin Sodium 4 Mg Tabs (Warfarin Sodium) .... Use As Directed By Anticoagulation Clinic 9)  Crestor 40 Mg Tabs (Rosuvastatin Calcium) .Marland Kitchen.. 1 Once Daily - Stop Simvastatin 10)  Tikosyn 500 Mcg Caps (Dofetilide) .... Two Times A Day/ Please Rush Order  Allergies (verified): No Known Drug Allergies  Past History:  Past Medical History: Last updated: 08/06/2008 Hx of DVT (ICD-453.40) CORONARY ARTERY DISEASE (ICD-414.00) (status post two-vessel CABG in 1986 and       PTCA in 1991 and 1997) Hx of CARDIOMYOPATHY (ICD-425.4) (EF 45%) DYSLIPIDEMIA (ICD-272.4) HYPERTENSION (ICD-401.9) Hx of MYOCARDIAL INFARCTION (ICD-410.90) Atrial Fibrillation Ventricular tachycardia Satus post defibrillator  Past Surgical History: Last updated: 08/06/2008 IMPLANTATION OF DEFIBRILLATOR, HX OF (ICD-V45.02) March 08 --- [employer] PERCUTANEOUS TRANSLUMINAL CORONARY ANGIOPLASTY, HX OF (ICD-V45.82) 1997 CORONARY ARTERY BYPASS GRAFT, THREE VESSEL, HX OF (ICD-V45.81) 1996 (last cath reports an atretic LIMA to the LAD and patent SVG to RCA)  Family History: Last updated: 04/24/2009 Family History of Diabetes: mother and brother, paternal grandfather No FH of Colon Cancer: Father died at age  81, patient uncertain of cause. Mother living born 37: alert, lives independently He denies any known history of coronary artery disease in first degree relatives.  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  - cigars - quit smoking cigerettes in 1996  Review of Systems  The patient denies anorexia, fever, weight loss, weight gain, decreased hearing, chest pain, dyspnea on exertion, prolonged cough, headaches, abdominal pain, severe indigestion/heartburn, muscle weakness, difficulty walking, abnormal bleeding, and enlarged  lymph nodes.    Physical Exam  General:  WNWD white male in no acute distress Head:  normocephalic and atraumatic.   Eyes:  C&S clear Neck:  supple.   Lungs:  Normal respiratory effort, chest expands symmetrically. Lungs are clear to auscultation, no crackles or wheezes. Heart:  normal rate, regular rhythm, and no rub.   Abdomen:  soft, normal bowel sounds, no masses, no guarding, and no rigidity.  Tender to percussion over the right flank. Tender to deep palpation right abdomen at the level of the umbilicus Msk:  normal ROM, no joint tenderness, and no redness over joints.   Pulses:  2+ radial pulse Neurologic:  alert & oriented X3 and gait normal.   Skin:  turgor normal and color normal.   Cervical Nodes:  no anterior cervical adenopathy and no posterior cervical adenopathy.   Axillary Nodes:  no R axillary adenopathy and no L axillary adenopathy.   Psych:  Oriented X3, normally interactive, and good eye contact.     Impression & Recommendations:  Problem # 1:  PYELONEPHRITIS, ACUTE (ICD-590.10) Patient's presentation is c/w acute pyelonephritis with fever, pain with urination, flank pain and rigors.  Plan - labs- CBC, UA, metabolic          Rx - Septra DS two times a day x 7  - call back from pharmacy 12/3 due to interaction/contra-indiation with tikosyn. changed to cipro 500mg  two times a day for 7 days. There is a risk with cipro but also with all major anitibiotic classes appropriate for treating pyelo as an outpatient.  Orders: TLB-BMP (Basic Metabolic Panel-BMET) (80048-METABOL) TLB-CBC Platelet - w/Differential (85025-CBCD) TLB-Udip w/ Micro (81001-URINE) T-Urine Culture (Spectrum Order) [id number])  Complete Medication List: 1)  Fish Oil 1000 Mg Caps (Omega-3 fatty acids) .... Take one tablet twice daily 2)  Altace 5 Mg Caps (Ramipril) .... Take one tablet daily 3)  Nitrostat 0.4 Mg Subl (Nitroglycerin) .... Dissolve 1 tab under tongue as needed for chest pain 4)   Multivitamins Tabs (Multiple vitamin) .... Take 1 tablet by mouth once a day 5)  Metoprolol Tartrate 100 Mg Tabs (Metoprolol tartrate) .... One by mouth two times a day 6)  Diltiazem Hcl Er Beads 120 Mg Xr24h-cap (Diltiazem hcl er beads) .... Take one capsule by mouth daily 7)  Digoxin 0.25 Mg Tabs (Digoxin) .... Take 1 tablet by mouth once a day 8)  Warfarin Sodium 4 Mg Tabs (Warfarin sodium) .... Use as directed by anticoagulation clinic 9)  Crestor 40 Mg Tabs (Rosuvastatin calcium) .Marland Kitchen.. 1 once daily - stop simvastatin 10)  Tikosyn 500 Mcg Caps (Dofetilide) .... Two times a day/ please rush order 11)  Cipro 500 Mg Tabs (Ciprofloxacin hcl) .Marland Kitchen.. 1 two times a day x 7 days  Patient: [name] Note: All result statuses are Final unless otherwise noted.  Tests: (1) BMP (METABOL)   Sodium  136 mEq/L                   135-145   Potassium                 4.7 mEq/L                   3.5-5.1   Chloride                  99 mEq/L                    96-112   Carbon Dioxide            29 mEq/L                    19-32   Glucose              [H]  116 mg/dL                   36-64   BUN                       23 mg/dL                    4-03   Creatinine                1.2 mg/dL                   4.7-4.2   Calcium                   8.9 mg/dL                   5.9-56.3   GFR                       65.34 mL/min                >60  Tests: (2) CBC Platelet w/Diff (CBCD)   White Cell Count     [H]  11.4 K/uL                   4.5-10.5     smear reviewed   Red Cell Count       [L]  4.07 Mil/uL                 4.22-5.81   Hemoglobin                13.0 g/dL                   87.5-64.3   Hematocrit           [L]  38.1 %                      39.0-52.0   MCV                       93.4 fl                     78.0-100.0   MCHC                      34.2 g/dL                   32.9-51.0  RDW                       13.2 %                      11.5-14.6   Platelet Count            158.0 K/uL                   150.0-400.0   Neutrophil %         [H]  90.7 %                      43.0-77.0   Lymphocyte %         [L]  3.9 %                       12.0-46.0   Monocyte %                5.1 %                       3.0-12.0   Eosinophils%              0.3 %                       0.0-5.0   Basophils %               0.0 %                       0.0-3.0   Neutrophill Absolute [H]  10.3 K/uL                   1.4-7.7   Lymphocyte Absolute  [L]  0.4 K/uL                    0.7-4.0   Monocyte Absolute         0.6 K/uL                    0.1-1.0  Eosinophils, Absolute                             0.0 K/uL                    0.0-0.7   Basophils Absolute        0.0 K/uL                    0.0-0.1  Tests: (3) UDip w/Micro (URINE)   Color                     YELLOW       RANGE:  Yellow;Lt. Yellow   Clarity                   CLEAR                       Clear   Specific Gravity          >=1.030                     1.000 - 1.030   Urine Ph  6.0                         5.0-8.0   Protein                   100                         Negative   Urine Glucose             NEGATIVE                    Negative   Ketones                   NEGATIVE                    Negative   Urine Bilirubin           NEGATIVE                    Negative   Blood                     LARGE                       Negative   Urobilinogen              1.0                         0.0 - 1.0   Leukocyte Esterace        SMALL                       Negative   Nitrite                   POSITIVE                    Negative   Urine WBC                 21-50/hpf                   0-2/hpf   Urine RBC                 7-10/hpf                    0-2/hpf   Urine Epith               Few(5-10/hpf)               Rare(0-4/hpf)   Urine Bacteria            Many(>50/hpf)               None   Tests: (1) Culture, Urine (95284) ! COLONY COUNT:       RESULT: >=100,000 COLONIES/ML  (P) ! PRELIM REPORT             ESCHERICHIA COLI  (P)  Summary of Call: Pharm called, there is interaction between tikosyn and septra ds. Sent message to Dr Debby Bud. Ok change to Cipro 500mg  two times a day x 7 days  Initial call taken by: Lamar Sprinkles, CMA,  April 24, 2010 10:20 AMPrescriptions: SULFAMETHOXAZOLE-TMP DS 800-160 MG TABS (SULFAMETHOXAZOLE-TRIMETHOPRIM) 1 by mouth two times a day  x 10 days  #20 x 0   Entered and Authorized by:   Jacques Navy MD   Signed by:   Jacques Navy MD on 04/23/2010   Method used:   Electronically to        [facility] [phone]* (retail)       [address]       [place], [place]  [address]       Ph: [phone] or [phone]       Fax: [phone]   RxID:   [id number]    Orders Added: 1)  TLB-BMP (Basic Metabolic Panel-BMET) [80048-METABOL] 2)  TLB-CBC Platelet - w/Differential [85025-CBCD] 3)  TLB-Udip w/ Micro [81001-URINE] 4)  T-Urine Culture (Spectrum Order) [[id number]] 5)  Est. Patient Level IV [59563]    Laboratory Results   Urine Tests   Date/Time Reported: Ami Bullins CMA  April 23, 2010 3:52 PM   Routine Urinalysis   Color: red Appearance: Hazy Glucose: negative   (Normal Range: Negative) Bilirubin: negative   (Normal Range: Negative) Ketone: negative   (Normal Range: Negative) Spec. Gravity: 1.020   (Normal Range: 1.003-1.035) Blood: large   (Normal Range: Negative) pH: 6.5   (Normal Range: 5.0-8.0) Protein: 100   (Normal Range: Negative) Urobilinogen: 0.2   (Normal Range: 0-1) Nitrite: positive   (Normal Range: Negative) Leukocyte Esterace: negative   (Normal Range: Negative)

## 2010-06-22 NOTE — Medication Information (Signed)
Summary: rov/ln  Anticoagulant Therapy  Managed by: Reina Fuse, PharmD Referring MD: Odessa Fleming PCP: Illene Regulus MD Supervising MD: Myrtis Ser MD, Tinnie Gens Indication 1: deep vein thrombosis--arm (ICD-451.19) Indication 2: Atrial Fibrillation (ICD-427.31) Lab Used: [facility] Site: [facility] INR POC 2.6 INR RANGE 2 - 3  Dietary changes: no    Health status changes: no    Bleeding/hemorrhagic complications: no    Recent/future hospitalizations: no    Any changes in medication regimen? no    Recent/future dental: no  Any missed doses?: no       Is patient compliant with meds? yes       Current Medications (verified): 1)  Fish Oil 1000 Mg  Caps (Omega-3 Fatty Acids) .... Take One Tablet Twice Daily 2)  Altace 5 Mg  Caps (Ramipril) .... Take One Tablet Daily 3)  Nitrostat 0.4 Mg Subl (Nitroglycerin) .... Dissolve 1 Tab Under Tongue As Needed For Chest Pain 4)  Multivitamins   Tabs (Multiple Vitamin) .... Take 1 Tablet By Mouth Once A Day 5)  Metoprolol Tartrate 100 Mg  Tabs (Metoprolol Tartrate) .... One By Mouth Two Times A Day 6)  Diltiazem Hcl Er Beads 120 Mg Xr24h-Cap (Diltiazem Hcl Er Beads) .... Take One Capsule By Mouth Daily 7)  Digoxin 0.25 Mg Tabs (Digoxin) .... Take 1 Tablet By Mouth Once A Day 8)  Warfarin Sodium 4 Mg Tabs (Warfarin Sodium) .... Use As Directed By Anticoagulation Clinic 9)  Simvastatin 80 Mg Tabs (Simvastatin) .... Take 1 Tablet By Mouth Once A Day 10)  Tikosyn 500 Mcg Caps (Dofetilide) .... Two Times A Day/ Please Rush Order  Allergies (verified): No Known Drug Allergies  Anticoagulation Management History:      The patient is taking warfarin and comes in today for a routine follow up visit.  Positive risk factors for bleeding include an age of 75 years or older.  Negative risk factors for bleeding include no history of CVA/TIA.  The bleeding index is 'intermediate risk'.  Positive CHADS2 values include History of HTN.  Negative CHADS2 values  include Age > 20 years old, History of Diabetes, and Prior Stroke/CVA/TIA.  The start date was 09/07/2006.  Anticoagulation responsible provider: Myrtis Ser MD, Tinnie Gens.  INR POC: 2.6.  Cuvette Lot#: [id number].  Exp: 02/2011.    Anticoagulation Management Assessment/Plan:      The patient's current anticoagulation dose is Warfarin sodium 4 mg tabs: Use as directed by Anticoagulation Clinic.  The target INR is 2 - 3.  The next INR is due 02/09/2010.  Anticoagulation instructions were given to patient.  Results were reviewed/authorized by Reina Fuse, PharmD.  He was notified by Reina Fuse PharmD.         Prior Anticoagulation Instructions: INR 2.4  Continue same dose of 1 tab daily except 1/2 tab on Monday and Friday.  Re-check INR in 4 weeks.  Current Anticoagulation Instructions: INR 2.6  Continue taking Coumadin 1 tab (4 mg) on Sun, Tues, Wed, Thur, Sat and Coumadin 0.5 tab (2 mg) on Mon and Fri. Return to clinic in 5 weeks to coincide with MD appointment.

## 2010-06-22 NOTE — Medication Information (Signed)
Summary: rov/tm  Anticoagulant Therapy  Managed by: Eda Keys, PharmD Referring MD: Rollene Rotunda MD PCP: Illene Regulus MD Supervising MD: Shirlee Latch MD, Freida Busman Indication 1: deep vein thrombosis--arm (ICD-451.19) Indication 2: Atrial Fibrillation (ICD-427.31) Lab Used: [facility] Site: [facility] INR POC 2.5 INR RANGE 2 - 3  Dietary changes: no    Health status changes: no    Bleeding/hemorrhagic complications: no    Recent/future hospitalizations: no    Any changes in medication regimen? no    Recent/future dental: no  Any missed doses?: no       Is patient compliant with meds? yes       Allergies: No Known Drug Allergies  Anticoagulation Management History:      The patient is taking warfarin and comes in today for a routine follow up visit.  Positive risk factors for bleeding include an age of 75 years or older.  Negative risk factors for bleeding include no history of CVA/TIA.  The bleeding index is 'intermediate risk'.  Positive CHADS2 values include History of HTN.  Negative CHADS2 values include Age > 2 years old, History of Diabetes, and Prior Stroke/CVA/TIA.  The start date was 09/07/2006.  Anticoagulation responsible provider: Shirlee Latch MD, Mabelle Mungin.  INR POC: 2.5.  Cuvette Lot#: [id number].  Exp: 08/2010.    Anticoagulation Management Assessment/Plan:      The patient's current anticoagulation dose is Warfarin sodium 4 mg tabs: Use as directed by Anticoagulation Clinic.  The target INR is 2 - 3.  The next INR is due 07/30/2009.  Anticoagulation instructions were given to patient.  Results were reviewed/authorized by Eda Keys, PharmD.  He was notified by Eda Keys, PharmD.         Prior Anticoagulation Instructions: INR 2.8 Continue 2mg s daily except 4mg s on Tuesdays, Thursdays and Sundays. Recheck in 4 weeks.   Current Anticoagulation Instructions: INR 2.5  Continue current dosing schedule.  Take 1 tablet on Sunday, Tuesday, and Thursday, and  take 1/2 tablet all other days. Return to clinic in 4 weeks.

## 2010-06-22 NOTE — Cardiovascular Report (Signed)
Summary: Office Visit Remote   Office Visit Remote   Imported By: Roderic Ovens 06/03/2009 14:45:25  _____________________________________________________________________  External Attachment:    Type:   Image     Comment:   External Document

## 2010-06-22 NOTE — Medication Information (Signed)
Summary: rov/ewj  Anticoagulant Therapy  Managed by: Weston Brass, PharmD Referring MD: Odessa Fleming PCP: Illene Regulus MD Supervising MD: Gala Romney MD, Reuel Boom Indication 1: deep vein thrombosis--arm (ICD-451.19) Indication 2: Atrial Fibrillation (ICD-427.31) Lab Used: [facility]  Site: [facility] INR POC 3.3 INR RANGE 2 - 3  Dietary changes: no    Health status changes: no    Bleeding/hemorrhagic complications: no    Recent/future hospitalizations: no    Any changes in medication regimen? no    Recent/future dental: no  Any missed doses?: no       Is patient compliant with meds? yes       Allergies: No Known Drug Allergies  Anticoagulation Management History:      The patient is taking warfarin and comes in today for a routine follow up visit.  Positive risk factors for bleeding include an age of 75 years or older.  Negative risk factors for bleeding include no history of CVA/TIA.  The bleeding index is 'intermediate risk'.  Positive CHADS2 values include History of HTN.  Negative CHADS2 values include Age > 75 years old, History of Diabetes, and Prior Stroke/CVA/TIA.  The start date was 09/07/2006.  Anticoagulation responsible Tenesia Escudero: Bensimhon MD, Reuel Boom.  INR POC: 3.3.  Cuvette Lot#: [id number].  Exp: 03/2010.    Anticoagulation Management Assessment/Plan:      The patient's current anticoagulation dose is Warfarin sodium 4 mg tabs: Use as directed by Anticoagulation Clinic.  The target INR is 2 - 3.  The next INR is due 03/30/2010.  Anticoagulation instructions were given to patient.  Results were reviewed/authorized by Weston Brass, PharmD.  He was notified by Haynes Hoehn, PharmD Candidate.         Prior Anticoagulation Instructions: INR 2.9  Continue on same dosage 1 tablet daily except 1/2 tablet on Mondays and Fridays.  Recheck in 4 weeks.    Current Anticoagulation Instructions: INR 3.3  Skip today's dose, then resume normal Coumadin schedule of 1 tablet every  day of the week, except 1/2 tablet on Monday and Friday.  Return to clinic in 3 weeks.

## 2010-06-22 NOTE — Progress Notes (Signed)
Summary: RX clarification--[facility]  Phone Note From Pharmacy   Caller: [facility] Details of Request: [facility] [phone] Ref # [id number] Summary of Call: [facility] called in regards to Diltiazem and Simvastatin. I made them aware that the patient was given Crestor and told to stop Simvastatin. They will note and process prescription for patient. Initial call taken by: Lucious Groves CMA,  January 11, 2010 11:51 AM

## 2010-06-22 NOTE — Progress Notes (Signed)
    Immunization History:  Influenza Immunization History:    Influenza:  left deltoid KVQ#[id number] a exp date 11/17/2010 (02/12/2010)

## 2010-06-22 NOTE — Medication Information (Signed)
Summary: rov/sl  Anticoagulant Therapy  Managed by: Weston Brass, PharmD Referring MD: Odessa Fleming PCP: Illene Regulus MD Supervising MD: Jens Som MD, Arlys John Indication 1: deep vein thrombosis--arm (ICD-451.19) Indication 2: Atrial Fibrillation (ICD-427.31) Lab Used: [facility] Site: [facility] INR POC 3.6 INR RANGE 2 - 3  Dietary changes: no    Health status changes: no    Bleeding/hemorrhagic complications: no    Recent/future hospitalizations: no    Any changes in medication regimen? no    Recent/future dental: no  Any missed doses?: no       Is patient compliant with meds? yes       Allergies: No Known Drug Allergies  Anticoagulation Management History:      The patient is taking warfarin and comes in today for a routine follow up visit.  Positive risk factors for bleeding include an age of 35 years or older.  Negative risk factors for bleeding include no history of CVA/TIA.  The bleeding index is 'intermediate risk'.  Positive CHADS2 values include History of HTN.  Negative CHADS2 values include Age > 29 years old, History of Diabetes, and Prior Stroke/CVA/TIA.  The start date was 09/07/2006.  Anticoagulation responsible provider: Jens Som MD, Arlys John.  INR POC: 3.6.  Cuvette Lot#: [id number].  Exp: 04/2011.    Anticoagulation Management Assessment/Plan:      The patient's current anticoagulation dose is Warfarin sodium 4 mg tabs: Use as directed by Anticoagulation Clinic.  The target INR is 2 - 3.  The next INR is due 05/13/2010.  Anticoagulation instructions were given to patient.  Results were reviewed/authorized by Weston Brass, PharmD.  He was notified by Weston Brass PharmD.         Prior Anticoagulation Instructions: INR 3.1  Today, Tuesday, November 8th, take Coumadin 0.5 tab (2 mg). Then, resume taking Coumadin 1 tab (4 mg) all days except Coumadin 0.5 tab (2 mg) on Mondays and Fridays. Return to clinic in 3 weeks.   Current Anticoagulation Instructions: INR  3.6  Skip today's dose of Coumadin then decrease dose to 1 tablet every day except 1/2 tablet on Monday, Wednesday and Friday.   Recheck INR in 3 weeks.

## 2010-06-22 NOTE — Medication Information (Signed)
Summary: rov/eac  Anticoagulant Therapy  Managed by: Bethena Midget, RN, BSN Referring MD: Odessa Fleming PCP: Illene Regulus MD Supervising MD: Shirlee Latch MD, Ringo Sherod Indication 1: deep vein thrombosis--arm (ICD-451.19) Indication 2: Atrial Fibrillation (ICD-427.31) Lab Used: [facility] Site: [facility] INR POC 1.7 INR RANGE 2 - 3  Dietary changes: no    Health status changes: no    Bleeding/hemorrhagic complications: no    Recent/future hospitalizations: no    Any changes in medication regimen? no    Recent/future dental: no  Any missed doses?: no       Is patient compliant with meds? yes      Comments: Pt leaving going to [place] til end of month due to his low reading after consulting with S. Putt, Pharm D will increas his dose.   Allergies: No Known Drug Allergies  Anticoagulation Management History:      The patient is taking warfarin and comes in today for a routine follow up visit.  Positive risk factors for bleeding include an age of 75 years or older.  Negative risk factors for bleeding include no history of CVA/TIA.  The bleeding index is 'intermediate risk'.  Positive CHADS2 values include History of HTN.  Negative CHADS2 values include Age > 59 years old, History of Diabetes, and Prior Stroke/CVA/TIA.  The start date was 09/07/2006.  Anticoagulation responsible provider: Shirlee Latch MD, Lavalle Skoda.  INR POC: 1.7.  Cuvette Lot#: [id number].  Exp: 12/2010.    Anticoagulation Management Assessment/Plan:      The patient's current anticoagulation dose is Warfarin sodium 4 mg tabs: Use as directed by Anticoagulation Clinic.  The target INR is 2 - 3.  The next INR is due 11/16/2009.  Anticoagulation instructions were given to patient.  Results were reviewed/authorized by Bethena Midget, RN, BSN.  He was notified by Bethena Midget, RN, BSN.         Prior Anticoagulation Instructions: INR 2.0  Take 1.5 tablets today.   Then return to taking 1 tablet on Sunday, Tuesday, and Thursday, and 1/2  tablet all other days.  Return to clinic in 4 weeks.    Current Anticoagulation Instructions: INR 1.7 Today take 6mg s then change dose to 4mg s everyday except 2mg s on Mondays, Wednesdays and Fridays.   Recheck in 4 weeks due to vacation

## 2010-06-22 NOTE — Progress Notes (Signed)
Summary: MED INTERACTION  Phone Note From Other Clinic   Summary of Call: Pharm called, there is interaction between tikosyn and septra ds. Sent message to Dr Debby Bud. Ok change to Cipro 500mg  two times a day x 7 days  Initial call taken by: Lamar Sprinkles, CMA,  April 24, 2010 10:20 AM    New/Updated Medications: CIPRO 500 MG TABS (CIPROFLOXACIN HCL) 1 two times a day x 7 days Prescriptions: CIPRO 500 MG TABS (CIPROFLOXACIN HCL) 1 two times a day x 7 days  #14 x 0   Entered by:   Lamar Sprinkles, CMA   Authorized by:   Jacques Navy MD   Signed by:   Lamar Sprinkles, CMA on 04/24/2010   Method used:   Telephoned to ...       [facility]  [address] [phone]* (retail)       [address]       [place], [place]  [address]       Ph: [phone] or [phone]       Fax: [phone]   RxID:   [id number]

## 2010-06-22 NOTE — Medication Information (Signed)
Summary: rov/sl  Anticoagulant Therapy  Managed by: Cloyde Reams, RN, BSN Referring MD: Odessa Fleming PCP: Illene Regulus MD Supervising MD: Shirlee Latch MD, Samson Ralph Indication 1: deep vein thrombosis--arm (ICD-451.19) Indication 2: Atrial Fibrillation (ICD-427.31) Lab Used: [facility] Site: [facility] INR POC 2.9 INR RANGE 2 - 3  Dietary changes: no    Health status changes: no    Bleeding/hemorrhagic complications: no    Recent/future hospitalizations: no    Any changes in medication regimen? yes       Details: D/C Simvastatin and started Crestor 40mg  daily  Recent/future dental: no  Any missed doses?: no       Is patient compliant with meds? yes       Allergies: No Known Drug Allergies  Anticoagulation Management History:      The patient is taking warfarin and comes in today for a routine follow up visit.  Positive risk factors for bleeding include an age of 4 years or older.  Negative risk factors for bleeding include no history of CVA/TIA.  The bleeding index is 'intermediate risk'.  Positive CHADS2 values include History of HTN.  Negative CHADS2 values include Age > 76 years old, History of Diabetes, and Prior Stroke/CVA/TIA.  The start date was 09/07/2006.  Anticoagulation responsible provider: Shirlee Latch MD, Mervin Ramires.  INR POC: 2.9.  Cuvette Lot#: [id number].  Exp: 02/2011.    Anticoagulation Management Assessment/Plan:      The patient's current anticoagulation dose is Warfarin sodium 4 mg tabs: Use as directed by Anticoagulation Clinic.  The target INR is 2 - 3.  The next INR is due 03/09/2010.  Anticoagulation instructions were given to patient.  Results were reviewed/authorized by Cloyde Reams, RN, BSN.  He was notified by Cloyde Reams RN.         Prior Anticoagulation Instructions: INR 2.6  Continue taking Coumadin 1 tab (4 mg) on Sun, Tues, Wed, Thur, Sat and Coumadin 0.5 tab (2 mg) on Mon and Fri. Return to clinic in 5 weeks to coincide with MD appointment.   Current  Anticoagulation Instructions: INR 2.9  Continue on same dosage 1 tablet daily except 1/2 tablet on Mondays and Fridays.  Recheck in 4 weeks.

## 2010-06-22 NOTE — Medication Information (Signed)
Summary: rov/tm  Anticoagulant Therapy  Managed by: Eda Keys, PharmD Referring MD: Odessa Fleming PCP: Illene Regulus MD Supervising MD: Daleen Squibb MD, Maisie Fus Indication 1: deep vein thrombosis--arm (ICD-451.19) Indication 2: Atrial Fibrillation (ICD-427.31) Lab Used: [facility] Site: [facility] INR POC 2.0 INR RANGE 2 - 3  Dietary changes: no    Health status changes: no    Bleeding/hemorrhagic complications: no    Recent/future hospitalizations: no    Any changes in medication regimen? no    Recent/future dental: no  Any missed doses?: no       Is patient compliant with meds? yes       Allergies: No Known Drug Allergies  Anticoagulation Management History:      The patient is taking warfarin and comes in today for a routine follow up visit.  Positive risk factors for bleeding include an age of 75 years or older.  Negative risk factors for bleeding include no history of CVA/TIA.  The bleeding index is 'intermediate risk'.  Positive CHADS2 values include History of HTN.  Negative CHADS2 values include Age > 75 years old, History of Diabetes, and Prior Stroke/CVA/TIA.  The start date was 09/07/2006.  Anticoagulation responsible provider: Daleen Squibb MD, Maisie Fus.  INR POC: 2.0.  Cuvette Lot#: [id number].  Exp: 10/2010.    Anticoagulation Management Assessment/Plan:      The patient's current anticoagulation dose is Warfarin sodium 4 mg tabs: Use as directed by Anticoagulation Clinic.  The target INR is 2 - 3.  The next INR is due 10/20/2009.  Anticoagulation instructions were given to patient.  Results were reviewed/authorized by Eda Keys, PharmD.  He was notified by Eda Keys.         Prior Anticoagulation Instructions: INR 1.8 Today take 6mg s then resume 2mg s daily except 4mg s on Tuesdays, Thursdays and Sundays. Recheck in 3 weeks.   Current Anticoagulation Instructions: INR 2.0  Take 1.5 tablets today.   Then return to taking 1 tablet on Sunday, Tuesday, and Thursday,  and 1/2 tablet all other days.  Return to clinic in 4 weeks.

## 2010-06-22 NOTE — Medication Information (Signed)
Summary: rov/eac  Anticoagulant Therapy  Managed by: Charolotte Eke, PharmD Referring MD: Odessa Fleming PCP: Illene Regulus MD Supervising MD: Eden Emms MD, Theron Arista Indication 1: deep vein thrombosis--arm (ICD-451.19) Indication 2: Atrial Fibrillation (ICD-427.31) Lab Used: [facility] Site: [facility] INR POC 2.0 INR RANGE 2 - 3  Dietary changes: no    Health status changes: no    Bleeding/hemorrhagic complications: no    Recent/future hospitalizations: no    Any changes in medication regimen? no    Recent/future dental: no  Any missed doses?: no       Is patient compliant with meds? yes       Current Medications (verified): 1)  Fish Oil 1000 Mg  Caps (Omega-3 Fatty Acids) .... Take One Tablet Twice Daily 2)  Altace 5 Mg  Caps (Ramipril) .... Take One Tablet Daily 3)  Nitrostat 0.4 Mg Subl (Nitroglycerin) .... Dissolve 1 Tab Under Tongue As Needed For Chest Pain 4)  Multivitamins   Tabs (Multiple Vitamin) .... Take 1 Tablet By Mouth Once A Day 5)  Metoprolol Tartrate 100 Mg  Tabs (Metoprolol Tartrate) .... One By Mouth Two Times A Day 6)  Diltiazem Hcl Er Beads 120 Mg Xr24h-Cap (Diltiazem Hcl Er Beads) .... Take One Capsule By Mouth Daily 7)  Digoxin 0.25 Mg Tabs (Digoxin) .... Take 1 Tablet By Mouth Once A Day 8)  Warfarin Sodium 4 Mg Tabs (Warfarin Sodium) .... Use As Directed By Anticoagulation Clinic 9)  Simvastatin 80 Mg Tabs (Simvastatin) .... Take 1 Tablet By Mouth Once A Day 10)  Tikosyn 500 Mcg Caps (Dofetilide) .... Two Times A Day/ Please Rush Order  Allergies (verified): No Known Drug Allergies  Anticoagulation Management History:      The patient is taking warfarin and comes in today for a routine follow up visit.  Positive risk factors for bleeding include an age of 75 years or older.  Negative risk factors for bleeding include no history of CVA/TIA.  The bleeding index is 'intermediate risk'.  Positive CHADS2 values include History of HTN.  Negative CHADS2  values include Age > 68 years old, History of Diabetes, and Prior Stroke/CVA/TIA.  The start date was 09/07/2006.  Anticoagulation responsible provider: Eden Emms MD, Theron Arista.  INR POC: 2.0.  Cuvette Lot#: [id number].  Exp: 09/2010.    Anticoagulation Management Assessment/Plan:      The patient's current anticoagulation dose is Warfarin sodium 4 mg tabs: Use as directed by Anticoagulation Clinic.  The target INR is 2 - 3.  The next INR is due 08/27/2009.  Anticoagulation instructions were given to patient.  Results were reviewed/authorized by Charolotte Eke, PharmD.  He was notified by Charolotte Eke, PharmD.         Prior Anticoagulation Instructions: INR 2.5  Continue current dosing schedule.  Take 1 tablet on Sunday, Tuesday, and Thursday, and take 1/2 tablet all other days. Return to clinic in 4 weeks.  Current Anticoagulation Instructions: The patient is to continue with the same dose of coumadin.  This dosage includes: 4mg  Sun/Tue/Thu, 2mg  all other days.

## 2010-06-22 NOTE — Progress Notes (Signed)
Summary: Interaction - [facility]  Phone Note From Pharmacy   Caller: [facility] [phone] REF [phone] Summary of Call: Per [facility] there is a drug interaction with diltizem and another one of pt's meds. They did not leave details, would assume it is with simvastatin?  do you have any concerns about any of patient's medications?  Initial call taken by: Lamar Sprinkles, CMA,  January 05, 2010 2:53 PM  Follow-up for Phone Call        simvastatin 80- contra-indicated with diltiazem. Plan - change to crestor 40mg  once daily #90, refill as needed. Lipid panel 272.4 and hepatic panel 995.20 at 3 weeks Follow-up by: Jacques Navy MD,  January 06, 2010 4:59 AM  Additional Follow-up for Phone Call Additional follow up Details #1::        Pt informed of change and labs needed rx faxed to [facility] w/reference # attached.  Additional Follow-up by: Lamar Sprinkles, CMA,  January 06, 2010 5:38 PM    New/Updated Medications: CRESTOR 40 MG TABS (ROSUVASTATIN CALCIUM) 1 once daily - STOP SIMVASTATIN Prescriptions: CRESTOR 40 MG TABS (ROSUVASTATIN CALCIUM) 1 once daily - STOP SIMVASTATIN  #90 x 1   Entered by:   Lamar Sprinkles, CMA   Authorized by:   Jacques Navy MD   Signed by:   Lamar Sprinkles, CMA on 01/06/2010   Method used:   Faxed to ...       [facility] (mail-order)             , [place]         Ph:        Fax: [phone]   RxID:   [id number]

## 2010-06-22 NOTE — Letter (Signed)
Summary: Results Follow-up  [facility], Main Office  [address]   [place], [place] [address]   Phone: [phone]  Fax: [phone]     August 20, 2009 MRN: [id number]   [facility] [address] [place], [place]  [address]   Dear Mr. Rosencrans,  Dr Graciela Husbands has reviewed your recent stress test.  It does not appear to have changed since the last time.  He doesn't want to make any changes at this time.  Please call with questions.     Thank you,  [facility]

## 2010-06-22 NOTE — Cardiovascular Report (Signed)
Summary: Office Visit Remote   Office Visit Remote   Imported By: Roderic Ovens 01/11/2010 13:19:03  _____________________________________________________________________  External Attachment:    Type:   Image     Comment:   External Document

## 2010-06-22 NOTE — Progress Notes (Signed)
Summary: UPDATE  Phone Note Outgoing Call   Reason for Call: Discuss lab or test results Summary of Call: please call patient first thing Monday morning. If he is not feeling better and afebrile he will need to be seen Monday by one of my colleagues. Thanks Initial call taken by: Jacques Navy MD,  April 25, 2010 2:53 PM  Follow-up for Phone Call        Spoke w/pt, no fever & he is feeling better. Advised to call if anything changed Follow-up by: Lamar Sprinkles, CMA,  April 26, 2010 8:54 AM

## 2010-06-22 NOTE — Assessment & Plan Note (Signed)
Summary: defib [email or web address]   Primary Provider:  Illene Regulus MD  CC:  defib check.  .  History of Present Illness: [name] is seen in followup for ventriucular tachycardia and atrial fibrillation s/p ICD implantation with both appropriate and inappropriate therapy.  He has a history of ischemic heart disease with prior CABG and moderate depression of LV systolic function.   Tikosyn was started a few months ago and he is tolerating this with less atrial fibrillation. his major side effect his flatulence   He has had no CP;also no significant SOB.  He does have edema but this has been stable Last month he had a prolonged episode of tachypalpittions which were recognized but not treated by his ICD as it was classified as SVT b/cof mophology criteria.  Another episode associated with VA dissociation was appropriately treated.  he comes in today at our request because of recurrent episodes of ATP treated ventricular tachycardia. Reviewing his old charts there have been no VT for a long time until early summer and now been 4 or 5 episodes since then.  He denies chest pains exercise intolerance peripheral edema.  Reviewing his laboratories his last potassium was 4.3  Current Medications (verified): 1)  Fish Oil 1000 Mg  Caps (Omega-3 Fatty Acids) .... Take One Tablet Twice Daily 2)  Altace 5 Mg  Caps (Ramipril) .... Take One Tablet Daily 3)  Nitrostat 0.4 Mg Subl (Nitroglycerin) .... Dissolve 1 Tab Under Tongue As Needed For Chest Pain 4)  Multivitamins   Tabs (Multiple Vitamin) .... Take 1 Tablet By Mouth Once A Day 5)  Metoprolol Tartrate 100 Mg  Tabs (Metoprolol Tartrate) .... 2 By Mouth Two Times A Day 6)  Diltiazem Hcl Er Beads 120 Mg Xr24h-Cap (Diltiazem Hcl Er Beads) .... Take One Capsule By Mouth Daily 7)  Digoxin 0.25 Mg Tabs (Digoxin) .... Take 1 Tablet By Mouth Once A Day 8)  Warfarin Sodium 4 Mg Tabs (Warfarin Sodium) .... Use As Directed By Anticoagulation Clinic 9)   Simvastatin 80 Mg Tabs (Simvastatin) .... Take 1 Tablet By Mouth Once A Day 10)  Tikosyn 500 Mcg Caps (Dofetilide) .... Two Times A Day/ Please Rush Order  Allergies (verified): No Known Drug Allergies  Past History:  Past Medical History: Last updated: 08/06/2008 Hx of DVT (ICD-453.40) CORONARY ARTERY DISEASE (ICD-414.00) (status post two-vessel CABG in 1986 and       PTCA in 1991 and 1997) Hx of CARDIOMYOPATHY (ICD-425.4) (EF 45%) DYSLIPIDEMIA (ICD-272.4) HYPERTENSION (ICD-401.9) Hx of MYOCARDIAL INFARCTION (ICD-410.90) Atrial Fibrillation Ventricular tachycardia Satus post defibrillator  Past Surgical History: Last updated: 08/06/2008 IMPLANTATION OF DEFIBRILLATOR, HX OF (ICD-V45.02) March 08 --- [employer] PERCUTANEOUS TRANSLUMINAL CORONARY ANGIOPLASTY, HX OF (ICD-V45.82) 1997 CORONARY ARTERY BYPASS GRAFT, THREE VESSEL, HX OF (ICD-V45.81) 1996 (last cath reports an atretic LIMA to the LAD and patent SVG to RCA)  Family History: Last updated: 04/24/2009 Family History of Diabetes: mother and brother, paternal grandfather No FH of Colon Cancer: Father died at age 57, patient uncertain of cause. Mother living born 24: alert, lives independently He denies any known history of coronary artery disease in first degree relatives.  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  - cigars - quit smoking cigerettes in 1996  Vital Signs:  Patient profile:   77 year old male Height:      70 inches Weight:      204 pounds  BMI:     29.38 Pulse rate:   70 / minute Pulse rhythm:   regular BP sitting:   128 / 72  (left arm) Cuff size:   regular  Vitals Entered By: Judithe Modest CMA (June 23, 2009 3:43 PM)  Physical Exam  General:  The patient was alert and oriented in no acute distress. HEENT Normal.  Neck veins were flat, carotids were brisk.  Lungs were clear.  Heart sounds  were regular without murmurs. +s4  Abdomen was soft with active bowel sounds. There is no clubbing cyanosis or edema. Skin Warm and dry affect was engaging =  EKG  Procedure date:  06/23/2009  Findings:      atrial paced rhythm at 63 intervals 0.22/0.17/0.44 Axis--30 Left bundle branch block   ICD Specifications Following MD:  [name] Manges, MD     Referring MD:  [facility] ICD Vendor:  St Jude     ICD Model Number:  [id number]     ICD Serial Number:  [id number] ICD DOI:  08/15/2006     ICD Implanting MD:  [name] Manges, MD  Lead 1:    Location: RA     DOI: 08/15/2006     Model #: [id number]     Serial #: [id number]     Status: active Lead 2:    Location: RV     DOI: 08/15/2006     Model #: 7120     Serial #: [id number]     Status: active  Indications::  ICM, VT   ICD Follow Up Remote Check?  No Battery Voltage:  3.04 V     Charge Time:  10.9 seconds     Battery Est. Longevity:  4.9 YEARS Underlying rhythm:  SB ICD Dependent:  No       ICD Device Measurements Atrium:  Amplitude: 2.2 mV, Impedance: 510 ohms, Threshold: 1.0 V at 0.5 msec Right Ventricle:  Amplitude: 11.9 mV, Impedance: 390 ohms, Threshold: 1.0 V at 0.5 msec Shock Impedance: 41 ohms   Episodes MS Episodes:  9     Percent Mode Switch:  1%     Coumadin:  Yes Shock:  0     ATP:  1     Nonsustained:  0      Brady Parameters Mode DDDR     Lower Rate Limit:  60     Upper Rate Limit 120 PAV 200     Sensed AV Delay:  200  Tachy Zones VF:  222     VT:  190     VT1:  139     Next Remote Date:  09/21/2009     Next Cardiology Appt Due:  06/23/2010 Tech Comments:  Normal device function.  1 more episode of VT pace terminated since last Merlin transmission.  Longest mode switch was 1 min 46 seconds.  Pt on Tikosyn.  Rate controlled during mode switch.  No changes made today.  Plan for increased VT per SK. Pt does Merlin transmissions. Gypsy Balsam RN BSN  June 23, 2009 3:54 PM   Impression & Recommendations:  Problem # 1:   VENTRICULAR TACHYCARDIA (ICD-427.1) The patient continues to have episodes of ATP terminated ventricular tachycardia. These occur in the absence of clear-cut decompensation in cardio pulmonary function. However, they represent a significant increase in frequency compared to the preceding 12 months. Because of that we'll plan to undertake Myoview scanning. We should note that in 2008 he presented with ventricular tachycardia and underwent catheterization. Patent  grafts were identified it was felt that his ventricular arrhythmia was primarily electrical  Orders: Nuclear Stress Test (Nuc Stress Test) EKG w/ Interpretation (93000)  Problem # 2:  FIBRILLATION, ATRIAL (ICD-427.31) the patient continues on Tikosyn for his atrial fibrillation. His potassium was 4.3. His QT interval today as well as an range. We will continue him on his current medications His updated medication list for this problem includes:    Metoprolol Tartrate 100 Mg Tabs (Metoprolol tartrate) .Marland Kitchen... 2 by mouth two times a day    Digoxin 0.25 Mg Tabs (Digoxin) .Marland Kitchen... Take 1 tablet by mouth once a day    Warfarin Sodium 4 Mg Tabs (Warfarin sodium) ..... Use as directed by anticoagulation clinic    Tikosyn 500 Mcg Caps (Dofetilide) .Marland Kitchen..Marland Kitchen Two times a day/ please rush order  Problem # 3:  CARDIOMYOPATHY, ISCHEMIC S/P CABG (ICD-414.8) please see the discussion above  Problem # 4:  IMPLANTATION OF DEFIBRILLATOR, ST JUDE CURRENT DR RF 1610-96 (ICD-V45.02) Device parameters and data were reviewed.  the patient was complaining of palpitations as a were when he bent over to tie his shoe noted that his heart was 114. We've reproduce this maneuver in the office and noted that his heart rate went to about 100. We reprogrammed his rate response at reaction time from fast to medium. Upon repeat, his heart rate only went into the mid 80s. He is to let us know how he does with this    Patient Instructions: 1)  Your physician has requested that you  have an exercise stress myoview.  For further information please visit [email or web address]  Please follow instruction sheet, as given.

## 2010-06-22 NOTE — Medication Information (Signed)
Summary: rov/sp  Anticoagulant Therapy  Managed by: Weston Brass, PharmD Referring MD: Odessa Fleming PCP: Illene Regulus MD Supervising MD: Eden Emms MD,Caliegh Middlekauff Indication 1: deep vein thrombosis--arm (ICD-451.19) Indication 2: Atrial Fibrillation (ICD-427.31) Lab Used: [facility] Site: [facility] INR POC 2.4 INR RANGE 2 - 3  Dietary changes: no    Health status changes: no    Bleeding/hemorrhagic complications: no    Recent/future hospitalizations: no    Any changes in medication regimen? no    Recent/future dental: no  Any missed doses?: no       Is patient compliant with meds? yes       Allergies: No Known Drug Allergies  Anticoagulation Management History:      The patient is taking warfarin and comes in today for a routine follow up visit.  Positive risk factors for bleeding include an age of 75 years or older.  Negative risk factors for bleeding include no history of CVA/TIA.  The bleeding index is 'intermediate risk'.  Positive CHADS2 values include History of HTN.  Negative CHADS2 values include Age > 75 years old, History of Diabetes, and Prior Stroke/CVA/TIA.  The start date was 09/07/2006.  Anticoagulation responsible provider: Eden Emms MD,Chardae Mulkern.  INR POC: 2.4.  Cuvette Lot#: [id number].  Exp: 02/2011.    Anticoagulation Management Assessment/Plan:      The patient's current anticoagulation dose is Warfarin sodium 4 mg tabs: Use as directed by Anticoagulation Clinic.  The target INR is 2 - 3.  The next INR is due 01/04/2010.  Anticoagulation instructions were given to patient.  Results were reviewed/authorized by Weston Brass, PharmD.  He was notified by Dillard Cannon.         Prior Anticoagulation Instructions: INR 1.9  Take 1 tablet today then increase dose to 1 tablet every day except 1/2 tablet on Monday and Friday.    Current Anticoagulation Instructions: INR 2.4  Continue same dose of 1 tab daily except 1/2 tab on Monday and Friday.  Re-check INR in 4 weeks.

## 2010-06-24 ENCOUNTER — Encounter (INDEPENDENT_AMBULATORY_CARE_PROVIDER_SITE_OTHER): Payer: Medicare Other

## 2010-06-24 ENCOUNTER — Ambulatory Visit: Admit: 2010-06-24 | Payer: Self-pay | Admitting: Internal Medicine

## 2010-06-24 ENCOUNTER — Encounter: Payer: Self-pay | Admitting: Internal Medicine

## 2010-06-24 ENCOUNTER — Ambulatory Visit: Admit: 2010-06-24 | Payer: Self-pay

## 2010-06-24 ENCOUNTER — Encounter: Payer: Self-pay | Admitting: Cardiology

## 2010-06-24 DIAGNOSIS — I428 Other cardiomyopathies: Secondary | ICD-10-CM

## 2010-06-24 DIAGNOSIS — Z7901 Long term (current) use of anticoagulants: Secondary | ICD-10-CM

## 2010-06-24 DIAGNOSIS — I4891 Unspecified atrial fibrillation: Secondary | ICD-10-CM

## 2010-06-24 DIAGNOSIS — Z95810 Presence of automatic (implantable) cardiac defibrillator: Secondary | ICD-10-CM

## 2010-06-24 NOTE — Medication Information (Signed)
Summary: rov/tm  Anticoagulant Therapy  Managed by: Louann Sjogren, PharmD Referring MD: Odessa Fleming PCP: Illene Regulus MD Supervising MD: Eden Emms MD,Donathan Buller Indication 1: deep vein thrombosis--arm (ICD-451.19) Indication 2: Atrial Fibrillation (ICD-427.31) Lab Used: [facility] Site: [facility] INR POC 3.5 INR RANGE 2 - 3  Dietary changes: no    Health status changes: no    Bleeding/hemorrhagic complications: no    Recent/future hospitalizations: no    Any changes in medication regimen? no    Recent/future dental: no  Any missed doses?: no       Is patient compliant with meds? yes       Allergies: No Known Drug Allergies  Anticoagulation Management History:      The patient is taking warfarin and comes in today for a routine follow up visit.  Positive risk factors for bleeding include an age of 75 years or older.  Negative risk factors for bleeding include no history of CVA/TIA.  The bleeding index is 'intermediate risk'.  Positive CHADS2 values include History of HTN.  Negative CHADS2 values include Age > 59 years old, History of Diabetes, and Prior Stroke/CVA/TIA.  The start date was 09/07/2006.  Today's INR is 3.5.  Anticoagulation responsible provider: Eden Emms MD,Junita Kubota.  INR POC: 3.5.  Cuvette Lot#: [id number].  Exp: 05/2011.    Anticoagulation Management Assessment/Plan:      The patient's current anticoagulation dose is Warfarin sodium 4 mg tabs: Use as directed by Anticoagulation Clinic.  The target INR is 2 - 3.  The next INR is due 06/24/2010.  Anticoagulation instructions were given to patient.  Results were reviewed/authorized by Louann Sjogren, PharmD.         Prior Anticoagulation Instructions: INR 1.5 Today take 1 1/2 pills then change dose to 1 pill everyday except 1/2 pill on Mondays and Fridays. Recheck in 2-3 weeks.   Current Anticoagulation Instructions: INR 3.5 (goal 2-3)  Take only 1/2 tablet today (06/03/10) then follow dosing schedule below, taking  1 tablet everyday except take 1/2 tablet on Mondays, Wednesdays, and Fridays. Next appointment: Feb. 2nd at 9:15AM.

## 2010-06-24 NOTE — Cardiovascular Report (Signed)
Summary: Office Visit Remote   Office Visit Remote   Imported By: Roderic Ovens 05/07/2010 09:38:32  _____________________________________________________________________  External Attachment:    Type:   Image     Comment:   External Document

## 2010-06-24 NOTE — Medication Information (Signed)
Summary: rov/sp  Anticoagulant Therapy  Managed by: Bethena Midget, RN, BSN Referring MD: Odessa Fleming PCP: Illene Regulus MD Supervising MD: Jens Som MD, Arlys John Indication 1: deep vein thrombosis--arm (ICD-451.19) Indication 2: Atrial Fibrillation (ICD-427.31) Lab Used: [facility] Site: [facility] INR POC 1.5 INR RANGE 2 - 3  Dietary changes: yes       Details: Pt states he cut out drinking Wine almost completely  Health status changes: yes       Details: Recent UTI  Bleeding/hemorrhagic complications: no    Recent/future hospitalizations: no    Any changes in medication regimen? yes       Details: Finished Cipro 12/10 took for 7 days.   Recent/future dental: no  Any missed doses?: no       Is patient compliant with meds? yes      Comments: Pt states he has $20 co-pay everytime he comes in and refuses 2 week RTC.   Allergies: No Known Drug Allergies  Anticoagulation Management History:      The patient is taking warfarin and comes in today for a routine follow up visit.  Positive risk factors for bleeding include an age of 20 years or older.  Negative risk factors for bleeding include no history of CVA/TIA.  The bleeding index is 'intermediate risk'.  Positive CHADS2 values include History of HTN.  Negative CHADS2 values include Age > 67 years old, History of Diabetes, and Prior Stroke/CVA/TIA.  The start date was 09/07/2006.  Anticoagulation responsible provider: Jens Som MD, Arlys John.  INR POC: 1.5.  Cuvette Lot#: [id number].  Exp: 05/2011.    Anticoagulation Management Assessment/Plan:      The patient's current anticoagulation dose is Warfarin sodium 4 mg tabs: Use as directed by Anticoagulation Clinic.  The target INR is 2 - 3.  The next INR is due 06/03/2010.  Anticoagulation instructions were given to patient.  Results were reviewed/authorized by Bethena Midget, RN, BSN.  He was notified by Bethena Midget, RN, BSN.         Prior Anticoagulation Instructions: INR 3.6  Skip  today's dose of Coumadin then decrease dose to 1 tablet every day except 1/2 tablet on Monday, Wednesday and Friday.   Recheck INR in 3 weeks.   Current Anticoagulation Instructions: INR 1.5 Today take 1 1/2 pills then change dose to 1 pill everyday except 1/2 pill on Mondays and Fridays. Recheck in 2-3 weeks.

## 2010-06-24 NOTE — Letter (Signed)
Summary: Remote Device Check  [facility] [employer], Main Office  [address]   [place], [place] [address]   Phone: [phone]  Fax: [phone]     May 06, 2010 MRN: [id number]   [facility] [address] [place], [place]  [address]   Dear [name],   Your remote transmission was recieved and reviewed by your physician.  All diagnostics were within normal limits for you.  _X____Your next transmission is scheduled for: 06/24/2010  Please transmit at any time this day.  If you have a wireless device your transmission will be sent automatically.  ______Your next office visit is scheduled for:                              . Please call our office to schedule an appointment.    Sincerely,  Altha Harm, LPN

## 2010-06-30 NOTE — Medication Information (Signed)
Summary: rov/ewj  Anticoagulant Therapy  Managed by: Cloyde Reams, RN, BSN Referring MD: Odessa Fleming PCP: Illene Regulus MD Supervising MD: Jens Som MD, Arlys John Indication 1: deep vein thrombosis--arm (ICD-451.19) Indication 2: Atrial Fibrillation (ICD-427.31) Lab Used: [facility] Site: [facility] INR POC 2.6 INR RANGE 2 - 3  Dietary changes: no    Health status changes: no    Bleeding/hemorrhagic complications: no    Recent/future hospitalizations: no    Any changes in medication regimen? no    Recent/future dental: no  Any missed doses?: no       Is patient compliant with meds? yes       Allergies: No Known Drug Allergies  Anticoagulation Management History:      The patient is taking warfarin and comes in today for a routine follow up visit.  Positive risk factors for bleeding include an age of 75 years or older.  Negative risk factors for bleeding include no history of CVA/TIA.  The bleeding index is 'intermediate risk'.  Positive CHADS2 values include History of HTN.  Negative CHADS2 values include Age > 75 years old, History of Diabetes, and Prior Stroke/CVA/TIA.  The start date was 09/07/2006.  His last INR was 3.5.  Anticoagulation responsible provider: Jens Som MD, Arlys John.  INR POC: 2.6.  Cuvette Lot#: [id number].  Exp: 05/2011.    Anticoagulation Management Assessment/Plan:      The patient's current anticoagulation dose is Warfarin sodium 4 mg tabs: Use as directed by Anticoagulation Clinic.  The target INR is 2 - 3.  The next INR is due 07/22/2010.  Anticoagulation instructions were given to patient.  Results were reviewed/authorized by Cloyde Reams, RN, BSN.  He was notified by Cloyde Reams RN.         Prior Anticoagulation Instructions: INR 3.5 (goal 2-3)  Take only 1/2 tablet today (06/03/10) then follow dosing schedule below, taking 1 tablet everyday except take 1/2 tablet on Mondays, Wednesdays, and Fridays. Next appointment: Feb. 2nd at 9:15AM.  Current  Anticoagulation Instructions: INR 2.6  Continue on same dosage 1 tablet daily except 1/2 tablet on Mondays, Wednesdays, and Fridays.  Recheck in 4 weeks.

## 2010-07-01 DIAGNOSIS — I4891 Unspecified atrial fibrillation: Secondary | ICD-10-CM

## 2010-07-12 ENCOUNTER — Encounter (INDEPENDENT_AMBULATORY_CARE_PROVIDER_SITE_OTHER): Payer: Self-pay | Admitting: *Deleted

## 2010-07-20 NOTE — Cardiovascular Report (Signed)
Summary: Office Visit Remote   Office Visit Remote   Imported By: Roderic Ovens 07/16/2010 11:14:33  _____________________________________________________________________  External Attachment:    Type:   Image     Comment:   External Document

## 2010-07-20 NOTE — Letter (Signed)
Summary: Remote Device Check  [facility] [employer], Main Office  [address]   [place], [place] [address]   Phone: [phone]  Fax: [phone]     July 12, 2010 MRN: [id number]   [facility] [address] [place], [place]  [address]   Dear Mr. Umar,   Your remote transmission was recieved and reviewed by your physician.  All diagnostics were within normal limits for you.  __X____Your next office visit is scheduled for:  May 2012 with Dr Graciela Husbands. Please call our office to schedule an appointment.    Sincerely,  Vella Kohler

## 2010-07-22 ENCOUNTER — Encounter (INDEPENDENT_AMBULATORY_CARE_PROVIDER_SITE_OTHER): Payer: Medicare Other

## 2010-07-22 ENCOUNTER — Encounter: Payer: Self-pay | Admitting: Cardiology

## 2010-07-22 DIAGNOSIS — I80299 Phlebitis and thrombophlebitis of other deep vessels of unspecified lower extremity: Secondary | ICD-10-CM

## 2010-07-22 DIAGNOSIS — Z7901 Long term (current) use of anticoagulants: Secondary | ICD-10-CM

## 2010-07-22 DIAGNOSIS — I4891 Unspecified atrial fibrillation: Secondary | ICD-10-CM

## 2010-07-22 LAB — CONVERTED CEMR LAB: POC INR: 1.9

## 2010-07-29 NOTE — Medication Information (Signed)
Summary: rov/ewj  Anticoagulant Therapy  Managed by: Bethena Midget, RN, BSN Referring MD: Odessa Fleming PCP: Illene Regulus MD Supervising MD: Patty Sermons, MD Indication 1: deep vein thrombosis--arm (ICD-451.19) Indication 2: Atrial Fibrillation (ICD-427.31) Lab Used: [facility] Site: [facility] INR POC 1.9 INR RANGE 2 - 3  Dietary changes: no    Health status changes: no    Bleeding/hemorrhagic complications: no    Recent/future hospitalizations: no    Any changes in medication regimen? no    Recent/future dental: no  Any missed doses?: no       Is patient compliant with meds? yes       Allergies: No Known Drug Allergies  Anticoagulation Management History:      The patient is taking warfarin and comes in today for a routine follow up visit.  Positive risk factors for bleeding include an age of 75 years or older.  Negative risk factors for bleeding include no history of CVA/TIA.  The bleeding index is 'intermediate risk'.  Positive CHADS2 values include History of HTN.  Negative CHADS2 values include Age > 11 years old, History of Diabetes, and Prior Stroke/CVA/TIA.  The start date was 09/07/2006.  His last INR was 3.5.  Anticoagulation responsible provider: Patty Sermons, MD.  INR POC: 1.9.  Cuvette Lot#: [id number].  Exp: 05/2011.    Anticoagulation Management Assessment/Plan:      The patient's current anticoagulation dose is Warfarin sodium 4 mg tabs: Use as directed by Anticoagulation Clinic.  The target INR is 2 - 3.  The next INR is due 08/19/2010.  Anticoagulation instructions were given to patient.  Results were reviewed/authorized by Bethena Midget, RN, BSN.  He was notified by Bethena Midget, RN, BSN.         Prior Anticoagulation Instructions: INR 2.6  Continue on same dosage 1 tablet daily except 1/2 tablet on Mondays, Wednesdays, and Fridays.  Recheck in 4 weeks.   Current Anticoagulation Instructions: INR 1.9 Today take 1.5 pills then resume 1 pill everyday except 1/2  pill on Mondays, Wednesdays and Fridays. Recheck in 4 weeks.

## 2010-08-19 ENCOUNTER — Ambulatory Visit (INDEPENDENT_AMBULATORY_CARE_PROVIDER_SITE_OTHER): Payer: Medicare Other | Admitting: *Deleted

## 2010-08-19 DIAGNOSIS — Z7901 Long term (current) use of anticoagulants: Secondary | ICD-10-CM

## 2010-08-19 DIAGNOSIS — I4891 Unspecified atrial fibrillation: Secondary | ICD-10-CM

## 2010-08-19 LAB — POCT INR: INR: 1.9

## 2010-08-19 NOTE — Patient Instructions (Signed)
Start taking 1 tablet daily except 1/2 tablet on Mondays and Fridays.  Recheck in 4 weeks.

## 2010-08-28 LAB — BASIC METABOLIC PANEL
BUN: 17 mg/dL (ref 6–23)
Calcium: 8.9 mg/dL (ref 8.4–10.5)
Chloride: 101 mEq/L (ref 96–112)
Creatinine, Ser: 0.99 mg/dL (ref 0.4–1.5)
GFR calc Af Amer: >60 mL/min (ref >60)
GFR calc non Af Amer: >60 mL/min (ref >60)
GFR calc non Af Amer: >60 mL/min (ref >60)
Glucose, Bld: 102 mg/dL — ABNORMAL HIGH (ref 70–99)
Potassium: 4.8 mEq/L (ref 3.5–5.1)
Sodium: 136 mEq/L (ref 135–145)

## 2010-08-28 LAB — MAGNESIUM: Magnesium: 2.2 mg/dL (ref 1.5–2.5)

## 2010-08-28 LAB — PROTIME-INR: Prothrombin Time: 23.6 seconds — ABNORMAL HIGH (ref 11.6–15.2)

## 2010-08-29 LAB — CBC
MCHC: 34.3 g/dL (ref 30.0–36.0)
MCV: 93.3 fL (ref 78.0–100.0)
Platelets: 197 10*3/uL (ref 150–400)
RBC: 4.51 MIL/uL (ref 4.22–5.81)

## 2010-08-29 LAB — BASIC METABOLIC PANEL
BUN: 13 mg/dL (ref 6–23)
CO2: 26 mEq/L (ref 19–32)
CO2: 28 mEq/L (ref 19–32)
Chloride: 102 mEq/L (ref 96–112)
Chloride: 102 mEq/L (ref 96–112)
Creatinine, Ser: 0.93 mg/dL (ref 0.4–1.5)
Creatinine, Ser: 0.97 mg/dL (ref 0.4–1.5)
GFR calc Af Amer: >60 mL/min (ref >60)
Glucose, Bld: 108 mg/dL — ABNORMAL HIGH (ref 70–99)

## 2010-08-29 LAB — DIGOXIN LEVEL: Digoxin Level: 0.4 ng/mL — ABNORMAL LOW (ref 0.8–2.0)

## 2010-08-29 LAB — PROTIME-INR: INR: 2.1 — ABNORMAL HIGH (ref 0.00–1.49)

## 2010-08-31 LAB — POCT I-STAT, CHEM 8
BUN: 16 mg/dL (ref 6–23)
Calcium, Ion: 1.11 mmol/L — ABNORMAL LOW (ref 1.12–1.32)
Creatinine, Ser: 1.2 mg/dL (ref 0.4–1.5)
Glucose, Bld: 110 mg/dL — ABNORMAL HIGH (ref 70–99)
TCO2: 25 mmol/L (ref 0–100)

## 2010-08-31 LAB — DIFFERENTIAL
Basophils Absolute: 0.1 10*3/uL (ref 0.0–0.1)
Eosinophils Relative: 1 % (ref 0–5)
Lymphocytes Relative: 12 % (ref 12–46)
Lymphs Abs: 1.1 10*3/uL (ref 0.7–4.0)
Monocytes Absolute: 0.5 10*3/uL (ref 0.1–1.0)
Monocytes Relative: 5 % (ref 3–12)

## 2010-08-31 LAB — POCT CARDIAC MARKERS
CKMB, poc: <1 ng/mL — ABNORMAL LOW (ref 1.0–8.0)
Troponin i, poc: <0.05 ng/mL (ref 0.00–0.09)

## 2010-08-31 LAB — BASIC METABOLIC PANEL
BUN: 12 mg/dL (ref 6–23)
CO2: 26 mEq/L (ref 19–32)
Chloride: 104 mEq/L (ref 96–112)
Creatinine, Ser: 0.96 mg/dL (ref 0.4–1.5)
Potassium: 4.1 mEq/L (ref 3.5–5.1)

## 2010-08-31 LAB — CBC
HCT: 40.8 % (ref 39.0–52.0)
Hemoglobin: 14.1 g/dL (ref 13.0–17.0)
RDW: 12.9 % (ref 11.5–15.5)

## 2010-08-31 LAB — TSH: TSH: 1.463 u[IU]/mL (ref 0.350–4.500)

## 2010-08-31 LAB — PROTIME-INR: Prothrombin Time: 28.3 seconds — ABNORMAL HIGH (ref 11.6–15.2)

## 2010-09-16 ENCOUNTER — Ambulatory Visit (INDEPENDENT_AMBULATORY_CARE_PROVIDER_SITE_OTHER): Payer: Medicare Other | Admitting: *Deleted

## 2010-09-16 DIAGNOSIS — I4891 Unspecified atrial fibrillation: Secondary | ICD-10-CM

## 2010-09-18 ENCOUNTER — Encounter: Payer: Self-pay | Admitting: Internal Medicine

## 2010-09-21 ENCOUNTER — Ambulatory Visit (INDEPENDENT_AMBULATORY_CARE_PROVIDER_SITE_OTHER): Payer: Medicare Other | Admitting: Internal Medicine

## 2010-09-21 ENCOUNTER — Encounter: Payer: Self-pay | Admitting: Internal Medicine

## 2010-09-21 DIAGNOSIS — I059 Rheumatic mitral valve disease, unspecified: Secondary | ICD-10-CM

## 2010-09-21 DIAGNOSIS — I472 Ventricular tachycardia: Secondary | ICD-10-CM

## 2010-09-21 DIAGNOSIS — I340 Nonrheumatic mitral (valve) insufficiency: Secondary | ICD-10-CM

## 2010-09-21 DIAGNOSIS — I255 Ischemic cardiomyopathy: Secondary | ICD-10-CM | POA: Insufficient documentation

## 2010-09-21 DIAGNOSIS — I4891 Unspecified atrial fibrillation: Secondary | ICD-10-CM

## 2010-09-21 DIAGNOSIS — Z95810 Presence of automatic (implantable) cardiac defibrillator: Secondary | ICD-10-CM

## 2010-09-21 LAB — TSH: TSH: 1.13 u[IU]/mL (ref 0.35–5.50)

## 2010-09-21 LAB — BASIC METABOLIC PANEL
BUN: 17 mg/dL (ref 6–23)
Calcium: 9 mg/dL (ref 8.4–10.5)
Chloride: 106 mEq/L (ref 96–112)
Creatinine, Ser: 1 mg/dL (ref 0.4–1.5)

## 2010-09-21 NOTE — Progress Notes (Signed)
  HPI  [name] is a 77 y.o. male seen in followup for ventriucular tachycardia and atrial fibrillation s/p ICD implantation with both appropriate and inappropriate therapy.  He has a history of ischemic heart disease with prior CABG and moderate depression of LV systolic function.    Tikosyn was started a few months ago and he is tolerating this with less atrial fibrillation. This is done really very well for him. He has had no chest pain or significant shortness of breath.  There's been no peripheral edema palpitations or syncope.   Past Medical History  Diagnosis Date  . DVT (deep venous thrombosis)   . CAD (coronary artery disease)     status post two-vessel CABG in 1986 and PTCA in 1991 and 1997  . Cardiomyopathy     EF 45%  . Dyslipidemia   . HTN (hypertension)   . Myocardial infarction   . Atrial fibrillation   . Ventricular tachycardia   . S/P PTCA (percutaneous transluminal coronary angioplasty)   . History of three vessel coronary artery bypass   . Automatic implantable cardiac defibrillator in situ 3/08    [employer]  . History of colonoscopy 10/14/2002    Past Surgical History  Procedure Date  . Cardiac defibrillator placement 3/08    [employer]  . Ptca 1997  . Coronary artery bypass graft 1996    last cath reports an atretic LIMA to the LAD and patent SVG to RCA    Current Outpatient Prescriptions  Medication Sig Dispense Refill  . CRESTOR 40 MG tablet Take 1 tablet by mouth daily. STOP SIMVASTATIN      . digoxin (LANOXIN) 0.25 MG tablet Take 1 tablet by mouth daily.      Marland Kitchen DILT-XR 120 MG 24 hr capsule Take 1 capsule by mouth daily.      . fish oil-omega-3 fatty acids 1000 MG capsule Take 1 g by mouth 2 (two) times daily.        . metoprolol (LOPRESSOR) 100 MG tablet Take 1 tablet by mouth Twice daily.      . Multiple Vitamin (MULTIVITAMIN) tablet Take 1 tablet by mouth daily.        . nitroGLYCERIN (NITROSTAT) 0.4 MG SL tablet Place 0.4 mg under the tongue  every 5 (five) minutes as needed. For chest pain.       . ramipril (ALTACE) 5 MG capsule Take 1 tablet by mouth daily.      Marland Kitchen TIKOSYN 500 MCG capsule Take 1 capsule by mouth Twice daily.      Marland Kitchen warfarin (COUMADIN) 4 MG tablet Take by mouth as directed.          Allergies not on file  Review of Systems negative except from HPI and PMH  Physical Exam Well developed and well nourished in no acute distress HENT normal E scleral and icterus clear Neck Supple JVP flat; carotids brisk and full Clear to ausculation Regular rate and rhythm, a 2/6 murmur at the apex Soft with active bowel sounds No clubbing cyanosis and edema Alert and oriented, grossly normal motor and sensory function Skin Warm and Dry     Assessment and  Plan

## 2010-09-21 NOTE — Assessment & Plan Note (Signed)
We will reassess this by echo

## 2010-09-21 NOTE — Assessment & Plan Note (Signed)
He continues to have episodes of atrial fibrillation although it is not very frequent. In the event that we need pharmacotherapy for his ventricular tachycardia, we plan to change his Tikosyn to amiodarone

## 2010-09-21 NOTE — Assessment & Plan Note (Signed)
We'll need to reassess left ventricular function and coronary artery perfusion as a potential contributor to his ventricular tachycardia

## 2010-09-21 NOTE — Assessment & Plan Note (Signed)
The patient has had multiple episodes of ventricular tachycardia last couple of months. These have all been terminated with antitachycardia pacing. This represents a major option if the frequency of events. He does not have significant symptoms to suggest a primary cause for this. Looking however for a trigger is essential. We'll start with an ultrasound. He previously had been identified as having moderate mitral regurgitation. The ventricular tachycardia may be a consequence of electromechanical interactions.  He has coronary artery disease. When he first presented with ventricular tachycardia catheterization had demonstrated patency of his vessels. He may need reevaluation of this as that was in 2008.

## 2010-09-21 NOTE — Assessment & Plan Note (Signed)
The patient's device was interrogated.  The information was reviewed. No changes were made in the programming.    

## 2010-09-21 NOTE — Patient Instructions (Signed)
Your physician recommends that you have lab work today: bmet/ magnesium/ tsh (427.1;427.31).  Your physician has requested that you have an echocardiogram. Echocardiography is a painless test that uses sound waves to create images of your heart. It provides your doctor with information about the size and shape of your heart and how well your heart's chambers and valves are working. This procedure takes approximately one hour. There are no restrictions for this procedure.  Remote monitoring is used to monitor your Pacemaker of ICD from home. This monitoring reduces the number of office visits required to check your device to one time per year. It allows [place] to keep an eye on the functioning of your device to ensure it is working properly. You are scheduled for a device check from home on 12/23/10. You may send your transmission at any time that day. If you have a wireless device, the transmission will be sent automatically. After your physician reviews your transmission, you will receive a postcard with your next transmission date.

## 2010-09-25 ENCOUNTER — Other Ambulatory Visit: Payer: Self-pay | Admitting: Internal Medicine

## 2010-09-28 ENCOUNTER — Ambulatory Visit (HOSPITAL_COMMUNITY): Payer: Medicare Other | Attending: Internal Medicine | Admitting: Radiology

## 2010-09-28 DIAGNOSIS — I340 Nonrheumatic mitral (valve) insufficiency: Secondary | ICD-10-CM

## 2010-09-28 DIAGNOSIS — I2589 Other forms of chronic ischemic heart disease: Secondary | ICD-10-CM

## 2010-09-28 DIAGNOSIS — I059 Rheumatic mitral valve disease, unspecified: Secondary | ICD-10-CM | POA: Insufficient documentation

## 2010-10-05 NOTE — Assessment & Plan Note (Signed)
[facility]                                 ON-CALL NOTE   [name], [name]                      MRN:          [id number]  DATE:11/12/2006                            DOB:          May 21, 1934    PRIMARY CARDIOLOGIST:  Rollene Rotunda, M.D., [facility].   [name] called today because he was concerned that his ICD might have  fired.  He stated he was putting a gallon of milk back in the  refrigerator with the right arm (ICD is on the left), and he felt a  shock and saw a flash.  He stated that the shock was mainly in his arm,  and he did not feel as though he had been kicked in the chest.  He  stated that he had no palpitations, no presyncope, and no other  symptoms.  He stated he had had no chest pain or shortness of breath.  I  discussed the situation with [name].  I advised him that it was  unlikely his ICD had fired, since he mainly felt the shock in his arm  and did not actually feel like he had much happen to him in his chest.  I advised him that if his symptoms worsened or it happened again, he  should call 911 and come to the emergency room.  I advised if he had no  other symptoms and was otherwise doing well he could wait and see Dr.  Antoine Poche as scheduled on Friday.  He stated he was compliant with all of  his medications and not having any other difficulties.  He stated he  would keep his appointment with Dr. Antoine Poche on Friday and contact us in  the meantime if there were any other issues or concerns.      Theodore Demark, PA-C  Electronically Signed      Bevelyn Buckles. Bensimhon, MD  Electronically Signed   RB/MedQ  DD: 11/12/2006  DT: 11/13/2006  Job #: [id number]

## 2010-10-05 NOTE — Assessment & Plan Note (Signed)
[facility]                                 ON-CALL NOTE   AIRAM, HEIDECKER                      MRN:          [id number]  DATE:05/05/2008                            DOB:          03/26/1934    PRIMARY CARDIOLOGIST:  Rollene Rotunda, MD, [facility]   I received a call from Mr. Carmer this evening stating that his heart  rate had been elevated in the 160-170s for the past 15-20 minutes.  He  is a patient of Dr. Jenene Slicker with a history of atrial fibrillation,  currently on digoxin, diltiazem, and metoprolol therapy.  He states this  is the first time in a few months that he has had increased heart rate  reminiscent of atrial fibrillation.  He is asking what to do.  The  patient took his blood pressure while he was on the phone with me, and  it was 120/73.  I recommended that he could try taking a half of  metoprolol to see if that helps to slow his rate, however, that he  should have a low threshold to come into the emergency department for  evaluation as last time, he was going fast for greater than a few  minutes.  He required cardioversion.  The patient was grateful for the  callback and plans to come into the Emergency Department.     Nicolasa Ducking, ANP  Electronically Signed    CB/MedQ  DD: 05/05/2008  DT: 05/06/2008  Job #: [id number]

## 2010-10-05 NOTE — Assessment & Plan Note (Signed)
[facility]                            CARDIOLOGY OFFICE NOTE   [name], [name]                      MRN:          [id number]  DATE:04/12/2007                            DOB:          12-20-1933    PRIMARY:  Dr. Illene Regulus.   REASON FOR PRESENTATION:  Evaluate the patient with ischemic  cardiomyopathy.   HISTORY OF PRESENT ILLNESS:  The patient is 77 years old.  At the last  visit, I eliminated his carvedilol and increased his metoprolol from 50  to 100 mg.  We are using metoprolol for rate control as he has paroxysms  of atrial fibrillation.  He had no problem with this.  He had no pre-  syncope or syncope.  He had no fatigue.  He has no shortness of breath.  Denies any chest discomfort, neck or arm discomfort.  He is not having  any palpitations.   PAST MEDICAL HISTORY:  Myocardial infarction with 3 vessel coronary  artery bypass in 1986 and PTCA in 1991 with stenting in 1997.  Mild to  moderate cardiomyopathy (EF 45%).  Paroxysmal atrial fibrillation.  Hypertension.  Dyslipidemia.  Ventricular tachycardia status post  implantable defibrillator.   ALLERGIES:  None.   CURRENT MEDICATIONS:  1. Fish oil 1000 mg b.i.d.  2. Centrum Silver.  3. Simvastatin 40 mg daily.  4. Ramipril 500 mg daily.  5. Warfarin.  6. Metoprolol 100 mg daily.   REVIEW OF SYSTEMS:  As stated in the HPI and otherwise negative for  other systems.   PHYSICAL EXAMINATION:  The patient is in no distress.  Blood pressure 129/68, heart rate 65 and regular.  HEENT:  Eyelids unremarkable.  Pupils are equal, round, and reactive to  light and accommodation.  Fundi are not visualized.  NECK:  No jugular venous distension at 45 degrees, carotid upstroke  brisk and symmetric, no bruits, thyromegaly.  LYMPHATICS:  No adenopathy.  CHEST:  Well-healed ICD pocket.  LUNGS:  Clear to auscultation bilaterally.  HEART:  PMI not displaced or sustained, S1 and S2 within  normal limits,  no S3, no S4, no clicks, rubs, murmurs.  ABDOMEN:  Flat, positive bowel sounds, normal in frequency and pitch, no  bruits, rebound, guarding.  No midline pulsatile mass, hepatomegaly,  splenomegaly.  SKIN:  No rashes, no nodules.  EXTREMITIES:  With 2+ pulses, no edema.   ASSESSMENT AND PLAN:  1. Cardiomyopathy.  Today, I will titrate his metoprolol to 150 mg      daily.  The target will be 200 mg.  He will continue other      medications as listed.  2. Coronary disease.  He is not having any angina.  He will continue      with secondary risk reduction.  3. Ventricular tachycardia.  He is having his defibrillator followed      by Dr. Graciela Husbands.  He has had no episodes.  4. Atrial fibrillation.  He is tolerating Coumadin.  There have been      no symptomatic tachy palpitations.  5. Followup.  I will see him back in  about 6 weeks to see if I can      titrate his meds further.     Rollene Rotunda, MD, [facility]  Electronically Signed    JH/MedQ  DD: 04/12/2007  DT: 04/12/2007  Job #: [id number]

## 2010-10-05 NOTE — H&P (Signed)
[name], [name] NO.:  [id number]   MEDICAL RECORD NO.:  [id number]          PATIENT TYPE:  INP   LOCATION:  2034                         FACILITY:  [facility]   PHYSICIAN:  Duke Salvia, MD, FACCDATE OF BIRTH:  Dec 05, 1933   DATE OF ADMISSION:  12/19/2008  DATE OF DISCHARGE:                              HISTORY & PHYSICAL   This patient has no known drug allergies.   ELECTROPHYSIOLOGIST:  Duke Salvia, MD, [facility]   PRIMARY CAREGIVER:  Rosalyn Gess. Norins, MD   PRESENTING CIRCUMSTANCE:  I am here to start this new medication  (Tikosyn).   HISTORY OF PRESENT ILLNESS:  [name] is a 77 year old male.  He has a  history of myocardial infarction and his coronary artery bypass graft  surgery (two-vessel) was done in 1986.  He had subsequent PTCA in 1991,  stenting in 1997.  He also has a history of both atrial fibrillation,  rapid ventricular rate and ventricular tachycardia.  Unfortunately, both  of these rhythms have competed for access to his cardioverter at times  causing inappropriate cardioversions.   The patient had a [employer]. Jude CURRENT dual-chamber cardioverter-  defibrillator implanted in March 2008 for ischemic  cardiomyopathy/monomorphic ventricular tachycardia/syncope.  Since then,  he has had inappropriate discharges with atrial fibrillation, rapid  ventricular rate.   Most recently with his device reprogrammed to avoid inappropriate  discharges, he has actually missed an episode of ventricular tachycardia  on his device.   At office visit on November 18, 2008, his metoprolol therapy was increased  to 200 mg twice daily and plans were made to start on an antiarrhythmic  therapy with Tikosyn.  He presents today for Tikosyn load and  monitoring.  Of note, the patient mentions that since he had increase in  his metoprolol to 200 mg twice daily, he has not had any palpitations or  evidence of arrhythmia.   PAST MEDICAL HISTORY:  1. Myocardial  infarction in 1986.      a.     Ischemic cardiomyopathy, ejection fraction of 45%, less       catheterization on August 14, 2006.      b.     PTCA in 1991, stenting in 1997.  2. Left heart catheterization on August 14, 2006.  The LAD had a 40%      stenosis after the diagonal, the left circumflex of 40% midpoint      stenosis.      a.     The LIMA was atretic, the saphenous vein graft to the       posterior descending coronary artery was patent.      b.     Inferior hypokinesis and inferior apical akinesis.  3. Atrial fibrillation, paroxysmal; chronic Coumadin.  4. ICD implant, March 2008.      a.     St. Jude CURRENT dual-chamber cardioverter-defibrillator.      b.     Implant for monomorphic ventricular       tachycardia/syncope/ischemic cardiomyopathy.  5. History of inappropriate shocks for atrial fib with rapid  ventricular rates.  6. Chronic left bundle-branch block.  7. New York Heart Association class II chronic systolic congestive      heart failure.  8. Hypertension.  9. Dyslipidemia.  10.History of DVT, electrocardiogram this admission July 30, shows A-      paced rate of 60.  He is A-pacing.  He has a QRS of 172, a left      bundle-branch block, a QTc of 431.   MEDICATIONS:  1. Digoxin 0.25 mg daily.  2. Diltiazem 120 mg daily.  3. Vytorin 10/80 one tablet daily at bedtime.  4. Metoprolol 100 mg tablets 2 in the morning and 2 in the evening.  5. Ramipril 5 mg daily.  6. Coumadin 4 mg daily, but 2 mg Monday, Wednesday, Friday, and      Saturday.  7. Multivitamin daily.  8. Nitroglycerin 0.4 mg 1 tablet sublingually every 5 minutes x3 doses      as needed for chest pain.   LABORATORY STUDIES THIS ADMISSION:  Magnesium 2.2.  Sodium 136,  potassium 4.3, chloride 102, carbonate 26, BUN is 13, creatinine 0.97,  and glucose 108.  White cells 6.7, hemoglobin 14.4, hematocrit 42.1, and  platelets 197.  Digoxin 0.4.  Protime 24.8.  INR is 2.1.   SOCIAL HISTORY:  The  patient is married.  He just came back from a  cruise.  He does not smoke.  He does take wine daily.  The patient is  currently retired.   FAMILY HISTORY:  Mother is living at age 59.  Father died at age 64 of  old age.  One brother is living.  He weighs 40 pounds, has diabetes, but  no coronary artery disease.  Two brothers are deceased, 1 with throat  cancer and 1 with lung cancer.   REVIEW OF SYSTEMS:  CONSTITUTIONAL:  No fevers or chills.  No nausea,  vomiting, or diarrhea.  No night sweats.  No unintentional weight gain  or loss, weight is stable.  HEENT: No diplopia, no photophobia.  No  epistaxis, no vertigo.  No integument or rashes, no nonhealing  ulcerations.  CARDIOPULMONARY:  No chest pain, no dyspnea, no dyspnea  with exertion, no orthopnea, no paroxysmal nocturnal dyspnea, and no  edema.  No coughing no wheezing, and no palpitations since increasing  metoprolol on November 18, 2008.  UROGENITAL:  No frequency or urgency at  urination.  No hematuria, occasional nocturia.  GASTROINTESTINAL:  No  gastroesophageal reflux.  No bright red blood per rectum.  No melena.  PSYCHIATRIC:  No anxiety or depression.  No unilateral weakness.  MUSCULOSKELETAL:  No arthralgias.  No joint deformities.  ENDOCRINE:  No  heat or cold intolerances.  No hyperhidrosis, no history of diabetes or  thyroid problems.   PHYSICAL EXAMINATION:  VITAL SIGNS:  Temperature 97.8, blood pressure  125/72, pulse is 61, respirations 18, and oxygen saturation 96% room  air.  GENERAL:  He is alert and oriented x3, comfortable.  Telemetry shows  that he is A-paced.  He has had no rapid rhythm since increasing  metoprolol.  HEENT:  Nasal discharge.  Pupils equal, round, and reactive to light.  Extraocular movements are intact.  Sclerae are anicteric.  Oropharynx  shows that the mucous membranes are moist.  No erythema or lesions.  NECK:  Supple.  No carotid bruits auscultated.  Jugular venous  distention is 2-3  cm.  LUNGS:  Clear to auscultation bilaterally.  No rales or wheezes.  CARDIOVASCULAR:  Regular  rate and rhythm without murmur.  Telemetry  shows that he is A-paced.  ABDOMEN:  Soft and nondistended.  Bowel sounds are present.  EXTREMITIES:  No clubbing, cyanosis, or edema.  NEUROLOGIC:  Grossly normal without deficit.   IMPRESSION:  1. Admitted for Tikosyn with monitoring of the QTc.  2..  The patient has a history of both atrial fibrillation and  ventricular tachycardia.  A.  Inappropriate discharges for fast atrial fibrillation.  1. Ischemic cardiomyopathy.      a.     Myocardial infarction in the past/coronary artery bypass       graft/percutaneous transluminal coronary angioplasty/stents.  2. Left bundle-branch block.  3. New York Heart Association class II congestive heart failure.  4. Hypertension.  5. Dyslipidemia.   PLAN:  Initiate Tikosyn 500 mcg q.12 h. and monitor QTc.  The patient  does have a cardioverter-defibrillator.      Maple Mirza, [place]      Duke Salvia, MD, [facility]  Electronically Signed    GM/MEDQ  D:  12/19/2008  T:  12/20/2008  Job:  [id number]

## 2010-10-05 NOTE — Letter (Signed)
January 18, 2007    Rollene Rotunda, MD, [facility]  [address]  [address]  [place], [place] [address]   RE:  [name], [name]  MRN:  [id number]  /  DOB:  Sep 07, 1933   Dear [name]:   [name] comes in.  He has had two ICD discharges since device was  implanted.  Interrogation of these devices demonstrate atrial  fibrillation with rapid ventricular response.  He was unaware of  palpitations.   He does not have antecedent history of atrial fibrillation.  He is on  Coumadin, however, for DVT of the left upper extremity following device  implantation.  He has ischemic heart disease with prior bypass surgery.   MEDICATIONS:  1. Coreg, now dosed to 12.5 b.i.d.  2. Coumadin.  3. Ramipril 5.  4. Simvastatin.   PHYSICAL EXAMINATION:  VITAL SIGNS:  Blood pressure 122/74, pulse 60.  LUNGS:  Clear.  HEART:  Sounds were regular.  EXTREMITIES:  Without edema.   Interrogation of his St. Jude's ICD demonstrates a P wave of 2.2 with  impedance of 6, threshold of 1.5, R wave 11.9 with impedance of 580,  threshold of 0.5 and 0.3.  There were numerous intercurrent episodes,  all of which represented atrial fibrillation, some of which were  appropriately identified, others of which fell into the VT bend because  of regularization.   IMPRESSION:  1. Polymorphic ventricular tachycardia.  2. Status post ICD for #1.  3. Ischemic cardiomyopathy with depressed left ventricular function      and prior bypass.  4. Atrial fibrillation, newly diagnosed with rapid ventricular      response - paroxysmal - recurrent.  5. DVT status post ICD implantation on Coumadin.   [name], [name] is going to unfortunately need to continue Coumadin path  to six months, which is going to be up in October.  In addition, I am  concerned about his rate control, so I am going to give him a  prescription today.  [name] to take Toprol 50 mg daily beginning 25  mg for the first four days or so.  It is a better rate control  agent  than the Carvedilol.  I will leave the up titration of his Carvedilol to  you.  We will plan to see him again in about 9 months.  He will be  followed up remotely in the interim.  If I can be of any further  assistance in the interim, do not hesitate to contact me.    Sincerely,      [name] Salvia, MD, [facility]  Electronically Signed    SCK/MedQ  DD: 01/18/2007  DT: 01/19/2007  Job #: [id number]

## 2010-10-05 NOTE — Consult Note (Signed)
[name], HACKLER NO.:  [id number]   MEDICAL RECORD NO.:  [id number]          PATIENT TYPE:  INP   LOCATION:  2907                         FACILITY:  [facility]   PHYSICIAN:  Duke Salvia, MD, FACCDATE OF BIRTH:  11-13-1933   DATE OF CONSULTATION:  09/22/2008  DATE OF DISCHARGE:                                 CONSULTATION   Thank you very much for asking [name] to see [name] in consultation  because of wide complex tachycardia.   [name] is a 77 year old gentleman with a history of ventricular  tachycardia, who presented a couple of years ago with monomorphic VT  with a right bundle branch block morphology, who underwent an ICD  implantation at that time.  He presents today with persistent tachy  palpitations and was found to have a right bundle branch block with  ventricular rate of 155 beats per minute.  Interrogation of his ICD  demonstrated atrial fibrillation and concurrent ventricular tachycardia.  The patient has had symptoms of persistent tachy palpitations, modest  exercise intolerance, shortness of breath, and some chest discomfort  today since the rhythm started by 8:30 this morning while exercising.   He notes he has had recurrent episodes over the recent months where his  heart goes up like this, but comes down by itself after 30 minutes to an  hour.  I should note that his ICD is programmed to detect VT all the  rates faster than 200 beats per minute (see below).   The patient has a history of ischemic heart disease.  He is status post  CABG in 1886, status post circumflex intervention in 1991 and 1997.  A  repeat catheterization in March 2008 demonstrated no significant  obstructive disease.  The ejection fraction was 40-45%.  He had moderate  mitral regurgitation.  Repeat evaluation of his LV function and/or  coronary perfusion has not been currently undertaken.   His family notes also he has progressive exercise intolerance.  He is  draggy, fatigued, and with some worsening in exercise performance.  He  does not have nocturnal dyspnea or peripheral edema.  His past medical  history in addition to the above is notable for:  1. Syncope.  2. Chronic left bundle branch block.  3. Dyslipidemia.  4. Hypertension.   His past surgical history is notable for the bypass surgery and ICD  implant.   His review of systems in addition to the above is notable for  osteoarthritis and is otherwise broadly negative across multiple organ  systems except as noted above.   His family history is noncontributory.   SOCIAL HISTORY:  He is married.  He is retired.  He has children.  They  are anticipating going on a cruise with their children to celebrate his  wife's 70th birthday.   His medications at home include:  1. Fish oil.  2. Cardizem 240.  3. Toprol 200.  4. Altace.  5. Coumadin.  6. Digoxin 0.25.  7. Vytorin 10/80.   PHYSICAL EXAMINATION:  GENERAL:  On evaluation, he is in mild distress.  VITAL SIGNS:  His blood pressure when I first saw him was 78/41 with a  pulse of 157.  His respirations were 22 and unlabored.  HEENT:  No  icterus or xanthoma.  NECK:  Neck veins were 9-10 cm.  His carotids are brisk and full  bilaterally without bruits.  BACK:  Without kyphosis, scoliosis.  LUNGS:  Clear.  HEART:  Sounds were regular with rapid 2/6 murmur heard at the apex.  ABDOMEN:  Soft with active bowel sounds without midline pulsation.  EXTREMITIES:  Femoral pulses were 2+.  Distal pulses were intact.  There  is no clubbing, cyanosis, or edema.  NEUROLOGIC:  Grossly normal.  SKIN:  Warm and dry.  PSYCH:  The affect was normal.  LYMPHATIC:  There is no lymphadenopathy.   Electrocardiogram of the tachycardia demonstrated a right bundle branch  block morphology at a cycle length of about 380 milliseconds.  I have  misplaced and I do not know the rest of the details of that.   Interrogation of a St. Jude ICD demonstrated  a VT as noted 380  milliseconds.  Pace termination was attempted initially at 300  milliseconds and then at 280 milliseconds.  The latter terminating  ventricular tachycardia restoring a ventricular paced rhythm.   Laboratories are pending at this time.  Laboratories are notable for an  INR of 2.3.  Cardiac initial markers are negative.  CBC was normal.  BMET was also normal with a creatinine of 1.2.  TSH was normal.   IMPRESSION:  1. Ventricular tachycardia - sustained - monomorphic with right bundle      branch block morphology.  2. Atrial fibrillation with a history of inappropriate therapy and      persistence of atrial fibrillation.  High dose atrioventricular      nodal blockade was undertaken to try to reduce intrinsic conduction      to avoid inappropriate therapy.  3. Coronary artery disease.      a.     Status post coronary artery bypass grafting in 1986.      b.     Status post percutaneous coronary intervention of the       circumflex in 1991 and 1997.      c.     Ejection fraction of 40-45% with moderate mitral       regurgitation by catheterization in March 2008 with nonobstructive       disease.  4. Progressive functional impairment, now class II B.  5. Remote history of syncope with history of bradycardia.  6. Left bundle branch block.  7. Status post St. Jude ICD with 60-70% backup ventricular pacing.   DISCUSSION:  [name] presents with monomorphic ventricular tachycardia  in the setting of underlying persistence of atrial fibrillation.  He  describes recurrent episodes of tachy palpitations, which I suspect have  been intermittent ventricular tachycardia over the last couple of  months.  Concurrent with that, his wife describes significant exercise  impairment.   The potential contributors to this would include:  1. Atrial fibrillation with loss of atrial kick.  2. Ventricular pacing either as a consequence of the above or      indirectly is causing left  ventricular dysfunction with subsequent      ventricular tachycardia potentially as a cause of electromechanical      interactions.  3. Less likely progressive coronary artery disease.   The issues of the therapy are also going to be quite complicated as he  has had inappropriate  therapy for atrial fibrillation likely at rates  far in excess of his ventricular tachycardia.  He may also be having  much more ventricular tachycardia that what we know, which is only two  episodes in 2 years, but I suspect it is much more than that.   From a symptom point of view, I would suggest that we attempt  cardioversion.  In the event that that is successful, I will plan an  outpatient echo to __________ function to make a decision regarding left  ventricular pacing and upgrade of his ICD.  We also need to undertake  his treadmill testing to assess the upper limit of sinus node function  so as to minimize the likelihood of inappropriate therapy at least from  sinus rhythm.  I would also be in favor of decreasing his Cardizem dose  to promote some increased degree of ventricular intrinsic conduction.   RECOMMENDATIONS:  Based on the above, we will therefore,  1. Admit overnight.  2. Plan DC cardioversion in the morning.  3. Outpatient echo and treadmill with me in a couple of weeks.  4. Reprogramming his ICD as I have done to include a VT zone from 139      up.  5. Continue current medications apart from decrease in his Cardizem      dose.   Thanks for the consultation.      Duke Salvia, MD, [facility]  Electronically Signed     SCK/MEDQ  D:  09/22/2008  T:  09/23/2008  Job:  [id number]

## 2010-10-05 NOTE — Discharge Summary (Signed)
NAMETEONDRE, JAROSZ NO.:  [id number]   MEDICAL RECORD NO.:  [id number]          PATIENT TYPE:  INP   LOCATION:  2907                         FACILITY:  [facility]   PHYSICIAN:  Duke Salvia, MD, FACCDATE OF BIRTH:  May 15, 1934   DATE OF ADMISSION:  09/22/2008  DATE OF DISCHARGE:  09/24/2008                               DISCHARGE SUMMARY   PRIMARY CARE:  Rosalyn Gess. Norins, MD   PRIMARY CARDIOLOGIST:  Rollene Rotunda, MD, [facility].   ELECTROPHYSIOLOGIST:  Duke Salvia, MD, [facility]   DISCHARGE DIAGNOSES:  1. Ventricular tachycardia status post antitachycardia pacing therapy      and device reprogramming.  2. Atrial fibrillation status post direct current cardioversion also      with runs of nonsustained atrial tachycardia.  3. Acute on chronic systolic congestive heart failure.  4. Ischemic cardiomyopathy.   Past medical history also includes:  1. Coronary artery disease status post CABG in 1986.  2. Hypertension.  3. Dyslipidemia.  4. Osteoarthritis.  5. Tobacco abuse.  6. Syncope.  7. Chronic left bundle-branch block.   HOSPITAL COURSE:  Mr. Ramthun is a 77 year old Caucasian gentleman with  history of ventricular tachycardia monomorphic with a right bundle-  branch block morphology status post ICD implant, the patient with a St.  Jude device.  He presented on day of admission with persistent tachy  palpitations.  He was found to have a right bundle-branch block with  ventricular rate of 155.  Interrogation of his ICD demonstrate atrial  fib and concurrent ventricular tachycardia, onset occurred while the  patient was working out at [employer]  He had some mild chest discomfort  and some shortness of breath.  The patient was seen by Dr. Graciela Husbands.  Successively, paced the patient out of the rhythm and reprogrammed his  device.  After adenosine therapy, it was not successful and no response  to Cardizem bolus.  The patient appropriately anticoagulated already  with  a therapeutic INR of 2.3 on day of admission.  The patient  admitted, underwent direct current cardioversion for the atrial  fibrillation on Sep 23, 2008.  Successful conversion to sinus rhythm  atrially paced rhythm without complications.  Dr. Graciela Husbands in to see the  patient on day of discharge, INR 2.3 at that time, blood pressure  stable.  The patient AV pacing at a rate of 70, being discharged home in  stable condition, scheduled to follow up with Dr. Graciela Husbands for a 2-D  echocardiogram and exercise stress on October 24, 2008, starting at 9:15.  I  have scheduled him to be seen in the Coumadin Clinic on Oct 01, 2008, at  3:30.   MEDICATIONS AT TIME OF DISCHARGE:  1. Continue his fish oil 1000 mg daily.  2. Multivitamin daily.  3. Altace 5.  4. Coumadin as previously directed.  5. Toprol-XL 100 mg b.i.d.  6. Vytorin 10/20.  7. Digoxin 0.25.  8. Diltiazem dose has been decreased to 120 mg daily.   DURATION OF DISCHARGE ENCOUNTER:  Less than 30 minutes.      Dorian Pod, ACNP  Duke Salvia, MD, [facility]  Electronically Signed    MB/MEDQ  D:  09/24/2008  T:  09/24/2008  Job:  [id number]

## 2010-10-05 NOTE — Assessment & Plan Note (Signed)
[facility]                            CARDIOLOGY OFFICE NOTE   [name], [name]                      MRN:          [id number]  DATE:12/17/2007                            DOB:          [birth year]    PRIMARY CARE PHYSICIAN:  Rosalyn Gess. Norins, MD.   REASON FOR PRESENTATION:  Evaluate the patient with coronary disease,  cardiomyopathy, and ventricular tachycardia.   HISTORY OF PRESENT ILLNESS:  The patient is a 77 year old gentleman.  Since I last saw him, he had atrial fibrillation resulting in  defibrillator firing.  He had his metoprolol increased and he had his  pacemaker treatment rate increased.  Since then he has had no firings of  his defibrillator.  He has felt no palpitations.  He has had no  presyncope or syncope.  He is active.  He pushes his lawnmower.  He has  had no chest discomfort, neck or arm discomfort.  He tolerated the  increased dose of his beta blocker with some slight lightheadedness, but  no longer he is bothered by this.   PAST MEDICAL HISTORY:  1. Coronary artery disease (status post two-vessel CABG in 1986 and      PTCA in 1991 and 1997).  2. Cardiomyopathy (EF 45%).  3. Paroxysmal atrial ablation.  4. Hypertension.  5. Dyslipidemia.  6. Ventricular tachycardia, status post implant of defibrillator.   ALLERGIES:  None.   MEDICATIONS:  Fish oil, Centrum, simvastatin 40 mg daily, ramipril 5 mg  daily, Coumadin, and metoprolol 100 mg b.i.d.   REVIEW OF SYSTEMS:  As stated in the HPI and otherwise negative for  other systems.   PHYSICAL EXAMINATION:  GENERAL:  The patient is in no distress.  VITAL SIGNS:  Blood pressure 118/72, heart rate 67 and regular, and  weight 199 pounds.  NECK:  No jugular venous distention at 45 degrees.  Carotid upstroke  brisk and symmetrical.  No bruits.  LUNGS:  Clear to auscultation bilaterally.  CHEST:  Well-healed sternotomy scar and ICD pocket.  HEART:  PMI not displaced or  sustained, S1 and S2 within normal.  No S3,  no murmurs.  ABDOMEN:  Flat, positive bowel sounds, normal frequency pitch, no  bruits, no rebound, no guarding, no midline pulsatile mass, no  organomegaly.  SKIN:  No rashes.  EXTREMITIES:  2+ pulse, no edema.   EKG:  Atrial paced rhythm, premature ectopic complexes, left bundle-  branch block.   ASSESSMENT/PLAN:  1. Coronary disease.  The patient is having no new symptoms.  No      further cardiovascular testing is suggested.  He will continue with      risk reduction.  2. Atrial fibrillation.  He has had no symptomatic paroxysms of this.      He is on the Coumadin.  3. Ventricular tachycardia.  He is due to see Dr. Graciela Husbands again in      August 2009 for ICD interrogation.  Again, he has had no further      firings.  4. Risk reduction.  We will check a lipid profile when he comes back  to get blood work done for Dr. Debby Bud.  The goal being LDL less      than 100 and HDL greater than 40.  5. Followup.  I will see the patient back again in February 2009 which      will be 6 months after Dr. Odessa Fleming visit.     Rollene Rotunda, MD, [facility]  Electronically Signed    JH/MedQ  DD: 12/17/2007  DT: 12/18/2007  Job #: [id number]   cc:   Rosalyn Gess. Norins, MD

## 2010-10-05 NOTE — Assessment & Plan Note (Signed)
[facility]                            CARDIOLOGY OFFICE NOTE   MERCER, PEIFER                      MRN:          [id number]  DATE:04/07/2008                            DOB:          [birth year]    PRIMARY CARE PHYSICIAN:  Rosalyn Gess. Norins, MD   REASON FOR PRESENTATION:  Evaluate the patient with atrial fibrillation.   HISTORY OF PRESENT ILLNESS:  The patient is a pleasant 77 year old  gentleman with atrial fibrillation as well as other cardiac issues  described below.  Since I last saw him, he has been feeling well.  He  has actually been working out at [employer] and has been noticing that with  minimal exercises heart rate is going into the 160s.  He does feel his  heart pounding if he comes back from walking the dog, but it resolves  pretty quickly.  He is not having any presyncope or syncope.  He has had  no firings of his defibrillator since last event.  He has had no chest  pressure, neck or arm discomfort.  He is not describing any new  shortness of breath and has no PND or orthopnea.   PAST MEDICAL HISTORY:  1. Coronary artery disease (status post two-vessel CABG in 1986 and      PTCA in 1991 and 1997).  2. Cardiomyopathy (EF 45%).  3. Paroxysmal atrial fibrillation.  4. Hypertension.  5. Dyslipidemia.  6. Ventricular tachycardia status post implantation of a defibrillator      (St. Jude Current [phone]).   ALLERGIES:  None.   MEDICATIONS:  1. Fish oil 1000 mg b.i.d.  2. Centrum Silver.  3. Ramipril 5 mg daily.  4. Coumadin.  5. Metoprolol 100 mg b.i.d.  6. Vytorin 10/80 daily.  7. Diltiazem CD 120 mg daily.  8. Digoxin 0.25 mg daily.   REVIEW OF SYSTEMS:  As stated in the HPI and otherwise negative for  other systems.   PHYSICAL EXAMINATION:  GENERAL:  The patient is in no distress.  VITAL SIGNS:  Blood pressure 131/84, heart rate 76 and irregular, weight  205 pounds, body mass index 27.  HEENT:  Eyes unremarkable,  pupils equal, round, and reactive to light,  fundi not visualized, oral mucosa unremarkable.  NECK:  No jugular venous distention at 45 degrees, carotid upstroke  brisk and symmetric, no bruits, no thyromegaly.  LYMPHATICS:  No cervical, axillary, or inguinal adenopathy.  LUNGS:  Clear to auscultation bilaterally.  BACK:  No costovertebral angle tenderness.  CHEST:  Normal.  HEART:  PMI not displaced or sustained, S1 and S2 within normal, no S3,  no murmurs.  ABDOMEN:  Flat, positive bowel sounds normal in frequency and pitch, no  bruits, no rebound, no guarding, no midline pulsatile mass, no  hepatomegaly, no splenomegaly.  SKIN:  No rashes, no nodules.  EXTREMITIES:  Pulses 2+, bilateral ankle edema.  NEUROLOGIC:  Oriented to person, place, and time, cranial nerves II  through XII grossly intact, motor grossly intact.   ASSESSMENT AND PLAN:  1. Atrial fibrillation.  The patient still has atrial fibrillation  with rapid rate.  At this point, his blood pressure is better and      probably will allow me to increase his Cardizem.  He will go to 240      mg daily.  He will continue on the digoxin as listed.  He is on      Coumadin.  Eventually, we may need to consider strategy of rhythm      control either pharmacologically or with procedure.  2. Cardiomyopathy, continues on the meds as listed.  I will leave      these doses where they are.  3. Coronary disease, he is having no ongoing chest pain.  No further      cardiovascular testing is suggested.  4. Hypertension.  His blood pressure is controlled with the meds as      listed.  dyslipidemia.  He is concerned about Vytorin.  He did have this does  increased since I last saw him.  This is per Dr. Debby Bud.  The goal  should be an LDL less than 100 and HDL greater than 40.  He has not had  a lipid profile.  At the next appointment I will make sure he will order  one.     Rollene Rotunda, MD, [facility]  Electronically Signed     JH/MedQ  DD: 04/07/2008  DT: 04/07/2008  Job #: [id number]   cc:   Rosalyn Gess. Norins, MD

## 2010-10-05 NOTE — Assessment & Plan Note (Signed)
[facility]                            CARDIOLOGY OFFICE NOTE   [name], [name]                      MRN:          [id number]  DATE:03/08/2007                            DOB:          22-May-1934    PRIMARY CARE PHYSICIAN:  Dr. Illene Regulus.   REASON FOR PRESENTATION:  Evaluate patient with ischemic cardiomyopathy.   HISTORY OF PRESENT ILLNESS:  The patient is a pleasant 77 year old  gentleman who presents for follow up of the above. Since I last saw him,  he did see Dr. Graciela Husbands. He was noted to have a new diagnosis of atrial  fibrillation paroxysmally. Dr. Graciela Husbands started Toprol for better rate  control during these episodes. The patient is on anticoagulation because  of an upper extremity venous thrombosis following ICD implant.   He says that he is feeling very well. He has had no palpitations, pre-  syncope, or syncope. He denies any chest pain or shortness of breath. He  is remaining active.   PAST MEDICAL HISTORY:  Myocardial infarction with three vessel coronary  artery bypass in 1986 and PTC in 1991 with stenting in 1997, mild to  moderate cardiomyopathy (EF 45%), hypertension, dyslipidemia,  ventricular tachycardia status post implantable defibrillator.   ALLERGIES:  None.   CURRENT MEDICATIONS:  1. Fish oil 1000 mg b.i.d.  2. Centrum Silver.  3. Simvastatin 40 mg daily.  4. Ramipril 5 mg daily.  5. Coumadin.  6. Carvedilol 12.5 mg b.i.d.  7. Metoprolol 50 mg daily.   REVIEW OF SYSTEMS:  As stated in the HPI and otherwise negative for  other systems.   PHYSICAL EXAMINATION:  GENERAL:  The patient is in no distress.  VITAL SIGNS:  Blood pressure 128/78, heart rate 63 and regular, weight  190 pounds, body mass index 26.  HEENT:  Eyelids unremarkable, pupils equal, round, and reactive to  light, fundi not visualized, oral mucosa unremarkable.  NECK:  No jugular vein distension at 45 degrees, carotid upstroke brisk  and  symmetric, no bruits, no thyromegaly.  LYMPHATICS:  No adenopathy.  LUNGS:  Clear to auscultation bilaterally.  BACK:  No costovertebral angle tenderness.  CHEST:  Well healed ICD pocket and sternotomy scar.  HEART:  PMI not displaced or sustained, S1 and S2 within normal limits,  no S3, no S4, no clicks, rubs, or murmurs.  ABDOMEN:  Flat, positive bowel sounds, normal to frequency and pitch, no  bruits, no guarding, no rebounding no midline pulsatile mass, no  hepatomegaly, no splenomegaly.  SKIN:  No rashes, no nodules.  EXTREMITIES:  2+ pulses, no edema.  NEUROLOGIC:  Grossly intact.   ASSESSMENT AND PLAN:  1. Cardiomyopathy. Because he is now on Toprol for better rate control      with atrial fibrillation, I am going to switch him completely over      to this drug. I will stop the Coreg and increase his Toprol to 100      mg daily. The target will be 200 mg daily. He will remain on the      other medications as listed.  2. Hypertension. We will manage this in the context of his treating      heart failure and coronary disease.  3. Coronary disease. He will continue with secondary risk reduction.  4. Atrial fibrillation. Now that he has this documented, he certainly      has risk factors for thromboembolism and will require permanent      anticoagulation.  5. Follow up. I will see him back in one month for the next med      titration.     Rollene Rotunda, MD, [facility]  Electronically Signed    JH/MedQ  DD: 03/08/2007  DT: 03/09/2007  Job #: [id number]   cc:   Rosalyn Gess. Norins, MD

## 2010-10-05 NOTE — Assessment & Plan Note (Signed)
[facility]                         ELECTROPHYSIOLOGY OFFICE NOTE   [name], [name]                      MRN:          [id number]  DATE:01/08/2008                            DOB:          10-11-1933    [name] is seen in followup for an ICD discharge interrogation,  remotely had demonstrated was inappropriate associated with very rapid  atrial fibrillation that was quite irregular.   MEDICATIONS:  1. Metoprolol 100 mg twice daily.  2. Warfarin.  3. Ramipril 5.  4. Simvastatin.   PHYSICAL EXAMINATION:  VITAL SIGNS:  His blood pressure today is 117/76  with a pulse of 16, his weight was 199, which is stable.  GENERAL:  He is in no acute distress.  LUNGS:  Clear.  HEART:  Heart sounds were regular.  EXTREMITIES:  Without edema.  SKIN:  Warm and dry.   Interrogation of St. Jude device demonstrates P-wave of 1.5 with  impedance of 600.  The threshold of 1 volt at 0.5.  The R-wave was 11.9  with impedance of 390, and threshold of 0.5 at 0.5.  Battery voltage is  3.2.  There was one shock as noted that was delivered because of  recurrent excursion to the VS zone at 222 beats per minute.   IMPRESSION:  1. Atrial fibrillation with a rapid ventricular response associated      with inappropriate therapy.  2. Coronary artery disease.      a.     Status post bypass.      b.     Status post percutaneous coronary intervention.      c.     Ejection fraction of 45%.  3. Hypertension.  4. Coumadin therapy.   PLAN:  I have planned to increase his medications to by adding Cardizem  120 mg a day.  We have reprogrammed his device to hopefully avoid  inappropriate therapies in the 200-240 range.  If he has persistent  tachy palpitations, he is to let us know.   We will continue his metoprolol at his current dose.   Because of the very rapid atrial fibrillation, we will also check to see  if there is any underlying issues like thyroid, as his TSH a  year ago  was a little bit on the low side.  We also check his CBC.     [name] Salvia, MD, [facility]  Electronically Signed    SCK/MedQ  DD: 01/08/2008  DT: 01/08/2008  Job #: [id number]

## 2010-10-05 NOTE — Assessment & Plan Note (Signed)
[facility]                            CARDIOLOGY OFFICE NOTE   [name], [name]                      MRN:          [id number]  DATE:03/13/2008                            DOB:          09-Jan-1934    PRIMARY CARE PHYSICIAN:  Rosalyn Gess. Norins, MD.   REASON FOR PRESENTATION:  Evaluate the patient with chest pain and  atrial fibrillation.   HISTORY OF PRESENT ILLNESS:  The patient returns for followup.  He was  in the [facility] today and was noted to have had atrial  fibrillation in the last night.  He actually felt this.  He felt his  heart racing up to 150.  He had some chest tightness with this.  It  lasted for an hour.  He went to bed, but it has gone when he woke up.  He has been recorded on the pacemaker followup today.  Of note, he did  see Dr. Graciela Husbands in August and was noted to have an ICD discharge for  atrial fibrillation.  He was started on Cardizem at that time.  He had  his device reprogrammed to avoid inappropriate shocks.   The patient, otherwise, has not had any overt symptoms.  He, otherwise,  does not get chest pressure, neck, or arm discomfort.  He does not have  any palpitations, presyncope, or syncope.  He does not have any PND or  orthopnea.   PAST MEDICAL HISTORY:  Coronary artery disease status post two-vessel  CABG in 1986 and PTCA in 1991 and 1997.  (His last catheterization in  March 2008 demonstrated EF 45%.  The LAD had 40% stenosis.  A LIMA to  the LAD was atretic.  The circumflex had mid 40% stenosis.  The RCA was  totally occluded proximally.  There was a patent vein graft to the PDA  filling a posterolateral as well.  The vein graft had 40-50% narrowing.  The posterolateral at 70% stenosis.), cardiomyopathy (EF 45%),  paroxysmal atrial fibrillation, hypertension, dyslipidemia, ventricular  tachycardia, and status post St. Jude defibrillator placement.   ALLERGIES:  None.   MEDICATIONS:  1. Fish oil 1000  mg b.i.d.  2. Centrum Silver.  3. Ramipril 5 mg daily.  4. Coumadin.  5. Metoprolol 100 mg b.i.d.  6. Vytorin 10/80.  7. Diltiazem 120 mg daily.   REVIEW OF SYSTEMS:  As stated in the HPI and otherwise negative for  other systems.   PHYSICAL EXAMINATION:  GENERAL:  The patient is pleasant and in no  distress.  VITAL SIGNS:  Blood pressure is 113/78, heart rate 87 and regular,  weight 206 pounds, and body mass index 27.  HEENT:  Eyes are unremarkable, pupils are equal, round, and reactive to  light, fundi not visualized, oral mucosa unremarkable.  NECK:  No jugular venous distention at 45 degrees, carotid upstroke  brisk and symmetric, no bruits, and no thyromegaly.  LYMPHATICS:  No cervical, axillary, or inguinal adenopathy.  LUNGS:  Clear to auscultation bilaterally.  BACK:  No costovertebral angle tenderness.  CHEST:  Well-healed sternotomy scar and ICD pocket.  HEART:  PMI not displaced or sustained, S1 and S2 within normal limits,  no S3, no murmurs.  ABDOMEN:  Mildly obese, positive bowel sounds.  Normal in frequency and  pitch, no bruits, no rebound, no guarding, no midline pulsatile mass, no  hepatomegaly, no splenomegaly.  SKIN:  No rashes, no nodules.  EXTREMITIES:  Pulses are 2+, no edema, no cyanosis, no clubbing.  NEURO:  Oriented to person, place, and time, cranial nerves II-XII  grossly intact, motor grossly intact.   EKG; atrial fibrillation with demand ventricular pacing.   ASSESSMENT AND PLAN:  1. Atrial fibrillation.  The patient is having continued paroxysms of      this with high rates.  At this point, I am going to add digoxin      0.25 mg daily.  He has had normal renal function, this should be a      reasonable dose for him.  I am leery of going up on his Cardizem as      he was hypotensive last night, apparently into the 90s.  His blood      pressure is running low today, but we may have to up titrate.      Ultimately, we may need further rhythm  management.  He remains on      the Coumadin.  2. Cardiomyopathy.  He is euvolemic at this point and has really class      I-II symptoms.  He will remain on the medicines as listed.  3. Ventricular tachycardia status post implantable cardioverter-      defibrillator placement.  4. Hypertension.  This is currently not the issue and he remains      hypotensive, which is limiting our therapies.  5. Coronary artery disease.  He is having no ongoing symptoms.  No      further cardiovascular testing is suggested.  He will continue with      risk reduction.  6. Followup.  I would like to see him back in 1 month or sooner if      needed.     Rollene Rotunda, MD, [facility]  Electronically Signed    JH/MedQ  DD: 03/13/2008  DT: 03/13/2008  Job #: [id number]   cc:   Rosalyn Gess. Norins, MD

## 2010-10-05 NOTE — Assessment & Plan Note (Signed)
[place] [facility]                         ELECTROPHYSIOLOGY OFFICE NOTE   FAARIS, ARIZPE                      MRN:          [id number]  DATE:10/12/2007                            DOB:          Aug 13, 1933    Mr. Genet is seen today because of an episode of the atrial  fibrillation, resulting in defibrillator discharge about a week ago.  He  had gotten up that morning and noted that his heart rate was racing.  He  was going through the day doing his normal activities, went to mow the  lawn and then got shocked while mowing the lawn.  This was associated  with conversion back to sinus rhythm.   Review of the chart demonstrated the last episode of atrial fibrillation  identified by the device was in July or August of last year.  That also  resulted in appropriate therapy.  In both cases, he has felt better with  restoration of sinus rhythm.  However, this is probably not the way to  do it.   MEDICATIONS:  1. Ramipril 5.  2. Warfarin.  3. Metoprolol is now 100 b.i.d.   PHYSICAL EXAMINATION:  VITAL SIGNS:  Blood pressure 118/70 with pulse of  65.  Weight was 200, which is stable.  LUNGS:  Clear.  CARDIAC:  Heart sounds were regular.  EXTREMITIES:  Without edema.   Interrogation of his St. Jude ICD demonstrated the aforementioned  episodes.  The device was reprogrammed to make it a one zone device at  222 beats per minute.   He is advised to call us if he has recurrent tachy palpitations, at  which time we would bring him in for cardioversion if it persisted for  the 24 hours.     We are increasing his metoprolol to 100 mg b.i.d.  He has no clear  untoward side effects from this.   We will see him again in August as scheduled.     Duke Salvia, MD, [facility]  Electronically Signed    SCK/MedQ  DD: 10/12/2007  DT: 10/12/2007  Job #: [id number]

## 2010-10-05 NOTE — Discharge Summary (Signed)
NAMEANTJUAN, ROTHE NO.:  [id number]   MEDICAL RECORD NO.:  [id number]          PATIENT TYPE:  INP   LOCATION:  2034                         FACILITY:  [facility]   PHYSICIAN:  Duke Salvia, MD, FACCDATE OF BIRTH:  Feb 29, 1934   DATE OF ADMISSION:  12/19/2008  DATE OF DISCHARGE:  12/22/2008                               DISCHARGE SUMMARY   TIME FOR THIS DICTATION:  Greater than 35 minutes.   The patient has no known drug allergies.   FINAL DIAGNOSES:  1. Admitted for Tikosyn discharging after dose 6 on the evening of      December 22, 2008.  2. History of both atrial fibrillation with rapid ventricular rates      and ventricular tachycardia (both compete to initiate implantable      cardioverter-defibrillator shocks).  3. The patient tolerated Tikosyn 500 mcg every 12 hours during this      admission.  He had no prolongation of a QTc this admission.  Values      for potassium and magnesium have remained stable and within normal      limits required for Tikosyn.  On the day of discharge, the      potassium 4.5, magnesium 2.2.   SECONDARY DIAGNOSES:  1. Myocardial infarction in 1986.      a.     Ischemic cardiomyopathy, ejection fraction 45%, at the last       catheterization on August 14, 2006.      b.     PTCA in 1991, stenting in 1997.  2. Left heart catheterization on August 14, 2006.  The LAD had a 40%      stenosis after the diagonal, the left circumflex had 40% midpoint      stenosis.      a.     The LIMA was atretic, the saphenous vein graft to the       posterior descending coronary artery was patent.      b.     Inferior hypokinesis and inferior apical akinesis at       catheterization.  3. Paroxysmal atrial fibrillation, chronic Coumadin.  4. Implantable cardioverter-defibrillator implant in March 2008.      a.     St. Jude Current dual-chamber cardioverter-defibrillator.      b.     The implant was made for a history of monomorphic       ventricular  tachycardia/syncope/ischemic cardiomyopathy.  5. History of inappropriate ICD shocks for atrial fibrillation, rapid      ventricular rate.  6. Chronic left bundle branch block.  7. New York Heart Association class II chronic systolic congestive      heart failure.  8. Hypertension.  9. Dyslipidemia.  10.History of DVT.  11.Electrocardiogram this admission shows that the patient is A pacing      at 60.  He has a QRS of 172, bundle branch block, and a QTc of 431.   BRIEF HISTORY:  Mr. Verville is a 77 year old male.  He has a history of  myocardial infarction and coronary artery bypass graft surgery.  The  surgery was done in 1986.  He had subsequent PTCA in 1991.  He had  stenting in 1997.   He has a history of both atrial fibrillation with rapid ventricular  rates and ventricular tachycardia.  Unfortunately, both of these rhythms  have competed for access to his cardioverter-defibrillator.  At times,  they cause inappropriate cardioversions.   The patient had a St. Jude Current dual-chamber cardioverter-  defibrillator implanted in March 2008.  Since then, he has had  inappropriate discharges with atrial fibrillation with rapid ventricular  rate.   Most recently, his device was reprogrammed to avoid these inappropriate  discharges; however, the setting was so high that he missed episodes of  ventricular tachycardia and this was not treated.  At an office visit on  November 18, 2008, his metoprolol therapy was increased to 200 mg twice  daily.  Plans were made to start an antiarrhythmic with Tikosyn as the  medication.  The patient presents today on December 19, 2008, for Tikosyn  loading and monitoring.  Of note, the patient mentions that since he had  an increase in his metoprolol to 200 mg twice daily.  He has not felt  any palpitations or had any evidence of arrhythmia.   HOSPITAL COURSE:  The patient was admitted electively on December 19, 2008.  He was started on Tikosyn 500 mcg the same  day.  He has tolerated each  dose without any problems.  He has not had any evidence of ventricular  ectopy and his QTc has remained stable.  The patient is discharging  after dose #6.  The medications are as dictated on the medication  discharge instructions.  The new one, of course, is  Tikosyn 500 mcg 1 p.o. b.i.d.   LABORATORIES AND STUDIES THIS ADMISSION:  Magnesium on the day of  discharge 2.2.  Serum electrolytes on December 22, 2008, sodium 136,  potassium 4.5, chloride 104, carbonate 28, glucose 102, BUN is 17,  creatinine 0.99.  Protime at discharge is 24.0, INR 2.0.  Digoxin level  this admission was 0.4.      Maple Mirza, [place]      Duke Salvia, MD, [facility]  Electronically Signed    GM/MEDQ  D:  12/22/2008  T:  12/23/2008  Job:  [id number]   cc:   Rosalyn Gess. Norins, MD

## 2010-10-05 NOTE — Assessment & Plan Note (Signed)
[facility]                            CARDIOLOGY OFFICE NOTE   KASHTYN, JANKOWSKI                      MRN:          [id number]  DATE:05/19/2008                            DOB:          Aug 01, 1933    PRIMARY CARE PHYSICIAN:  Rosalyn Gess. Norins, MD   REASON FOR PRESENTATION:  Evaluate the patient with atrial fibrillation  and cardiomyopathy.   HISTORY OF PRESENT ILLNESS:  The patient presents for followup of the  above.  He is 77 years old.  Since I last saw him, he did have one  episode of rapid heart rate and called in to our PA on-call.  He took a  half dose of beta-blocker and it resolved.  He has not had any further  dysrhythmias since the May 05, 2008.  He has had no chest  discomfort.  He has had no shortness of breath.  He denies any PND or  orthopnea.  He has been working out with a Psychologist, educational at [employer]   PAST MEDICAL HISTORY:  Coronary artery disease (status post 2-vessel  CABG in 1986 and PTCA in 91 and 97), cardiomyopathy (EF approximately  45%), paroxysmal atrial fibrillation, hypertension, dyslipidemia,  ventricular tachycardia status post implantation of a defibrillator (St.  Jude Current on June 24, 2005 - July 26, 2005).   ALLERGIES:  None.   MEDICATIONS:  1. Fish oil 1000 mg b.i.d.  2. Centrum Silver.  3. Ramipril 5 mg daily.  4. Coumadin.  5. Metoprolol 100 mg b.i.d.  6. Vytorin 10/20 daily.  7. Digoxin 0.25 mg daily.  8. Diltiazem 240 mg daily.   REVIEW OF SYSTEMS:  As stated in the HPI, otherwise negative for other  systems.   PHYSICAL EXAMINATION:  GENERAL:  The patient is pleasant, in no  distress.  VITAL SIGNS:  Blood pressure 129/82, heart 72 and regular, weight 204  pounds, and body mass index 27.  NECK:  No jugular venous distention at 45 degrees.  Carotid upstroke  brisk and symmetrical.  No bruits or thyromegaly.  LYMPHATICS:  No adenopathy.  LUNGS:  Clear to auscultation bilaterally.  CHEST:   Well-healed sternotomy scar and ICD pocket.  HEART:  PMI not displaced or sustained, S1 and S2 within normal limits.  No S3, no clicks, rubs, or murmurs.  ABDOMEN:  Mildly obese, positive bowel sounds normal in frequency and  pitch, no bruits, rebound, guarding, or midline pulsatile mass.  No  organomegaly.  SKIN:  No rashes, nodules.  EXTREMITIES:  Pulse 2+.  No edema, cyanosis, or clubbing.  NEUROLOGIC:  Grossly intact.   EKG - atrial fibrillation with a ventricular pacing.   ASSESSMENT AND PLAN:  1. Atrial fibrillation.  The patient has had a few rapid rates, but      for the most part seems to be well controlled.  He tolerated the      increased dose of Cardizem, which we had to do last time.  At this      point, no further cardiovascular testing or change in meds is      suggested.  He will remain on Coumadin.  2. Cardiomyopathy.  He is euvolemic.  He has class I-II symptoms.  He      will remain in the meds as listed.  3. Ventricular tachycardia, status post implantable defibrillator.  He      follows up with Dr. Graciela Husbands.  4. Hypertension.  Blood pressure is controlled on the meds as listed.  5. Coronary artery disease.  He has no ongoing symptoms.  He will      continue with risk factor modification.  6. Followup.  I will see him again in about 4 months or sooner if      needed.     Rollene Rotunda, MD, [facility]  Electronically Signed    JH/MedQ  DD: 05/19/2008  DT: 05/19/2008  Job #: [id number]   cc:   Rosalyn Gess. Norins, MD

## 2010-10-05 NOTE — Assessment & Plan Note (Signed)
[facility]                            CARDIOLOGY OFFICE NOTE   [name], [name]                      MRN:          [id number]  DATE:01/02/2007                            DOB:          May 11, 1934    PRIMARY:  Rosalyn Gess. Norins, M.D.   REASON FOR PRESENTATION:  Evaluate the patient with ischemic  cardiomyopathy.   HISTORY OF PRESENT ILLNESS:  The patient is a pleasant 77 year old  gentleman who presents for followup of his cardiomyopathy and med  titration.  At the last visit, I increased his Coreg to 9.375 mg b.i.d.  He did well with this.  He has had no light-headedness, pre-syncope, or  syncope.  He has had no shortness of breath.  He denies any PND or  orthopnea.  He has had no palpitations.  He has not had any clear  firings of his defibrillator, though he has had an electrical sensation  in his hand once when he touched his mouse of his computer.  He has had  no chest discomfort, neck or arm discomfort.  He is anxious to get back  to exercise.   PAST MEDICAL HISTORY:  Myocardial infarction with 3-vessel coronary  artery bypass in 1986 and PTCA in 1991 with stenting in 1997.  Hypertension.  Dyslipidemia.  Mild to moderate reduced ejection fraction  (45%).  Ventricular tachycardia status post implantable defibrillator.   ALLERGIES:  None.   MEDICATIONS:  1. Fish oil.  2. Centrum Silver.  3. Simvastatin 40 mg daily.  4. Etodolac.  5. Ramipril 5 mg daily.  6. Coumadin.  7. Carvedilol 9.375 mg b.i.d.   REVIEW OF SYSTEMS:  As stated in the HPI and otherwise negative for  other systems.   PHYSICAL EXAMINATION:  The patient is in no distress.  Blood pressure 110/72, heart rate 60 and regular, weight 188 pounds.  Body mass index 26.  HEENT:  Eyelids unremarkable.  Pupils are equal, round, and reactive to  light and accommodation.  Fundi not visualized.  NECK:  No jugular venous distension at 45 degrees.  Carotid upstroke  brisk and  symmetric, no bruits.  LYMPHATICS:  No cervical, axillary, or inguinal adenopathy.  LUNGS:  Clear to auscultation bilaterally.  BACK:  No costovertebral angle tenderness.  CHEST:  Well-healed ICD pocket and sternotomy scar.  HEART:  PMI not displaced or sustained, S1 and S2 within normal limits,  no S3, no S4, no clicks, rubs, murmurs.  ABDOMEN:  Flat, positive bowel sounds, normal in frequency and pitch, no  bruits, rebound, guarding.  No midline pulsatile masses, organomegaly.  SKIN:  No rashes, no nodules.  EXTREMITIES:  With 2+ pulses, no edema.  NEURO:  Grossly intact.   EKG reveals atrial paced rhythm, left bundle branch block.   ASSESSMENT AND PLAN:  1. Cardiomyopathy.  Today, I will titrate his Coreg to 12.5 mg b.i.d.  2. Hypertension.  He is having this treated in the context of his      heart failure medications.  3. Coronary artery disease.  He will continue with secondary risk  reduction.  4. Deep vein thrombosis of the upper extremity.  The patient will      continue on Coumadin through October.  He is having his INR      followed in the Coumadin clinic.  5. Followup.  I will see the patient back in followup in 2 months or      sooner if needed.     Rollene Rotunda, MD, [facility]  Electronically Signed    JH/MedQ  DD: 01/02/2007  DT: 01/03/2007  Job #: [id number]   cc:   Rosalyn Gess. Norins, MD

## 2010-10-05 NOTE — Assessment & Plan Note (Signed)
[facility]                            CARDIOLOGY OFFICE NOTE   [name], [name]                      MRN:          [id number]  DATE:11/17/2006                            DOB:          Oct 19, 1933    PRIMARY CARE PHYSICIAN:  Dr. Debby Bud.   REASON FOR PRESENTATION:  Evaluate patient with ischemic cardiomyopathy.   HISTORY OF PRESENT ILLNESS:  Patient returns for followup of the above.  At the last visit he had some arm swelling and was found to have clot in  his left upper extremity following his ICD. He was hospitalized and put  on heparin and Coumadin. He has had therapeutic Coumadin levels since  then. He still gets swelling in his hands and his joints, and may have  some synovitis. He has not been having any cardiac complaints. He has  had no rapid heart rates. He has had no firings of his defibrillator. He  has had no pre syncope or syncope. He has had no chest pain or shortness  of breath.   PAST MEDICAL HISTORY:  Myocardial infarction with 3 vessel coronary  bypass in 1986, PTCA in 1991, and stenting in 1997, hypertension,  dyslipidemia, mild-to-moderately reduced ejection fraction (45%),  ventricular tachy cardia status post ICD placement.   ALLERGIES:  None.   MEDICATIONS:  1. Fish oil.  2. Centrum Sliver.  3. Simvastatin 40 mg daily.  4. Etodolac.  5. Carvedilol 6.25 mg b.i.d.  6. Ramipril 5 mg daily.  7. Coumadin as directed.   REVIEW OF SYSTEMS:  As stated in the HPI and otherwise negative for  other systems.   PHYSICAL EXAMINATION:  Patient is in no distress. Blood pressure 142/78,  heart rate 58 and regular.  HEENT: Eye lids unremarkable, pupils equal, round, and reactive to  light. Fundi not visualized. Oral mucosa unremarkable.  NECK: No jugular venous distension at 45 degrees. Carotid upstroke brisk  and symmetric.  LUNGS: Clear to auscultation bilaterally.  BACK: No costovertebral angle tenderness.  CHEST:  Well-healed ICD pocket and sternotomy scar.  HEART: PMI not displaced or sustained. S1 and S2 within normal limits.  No S3. No S4. No murmurs.  ABDOMEN: Flat, positive bowel sounds normal to frequency and pitch. No  bruits, rebound, or guarding. No midline pulsatile mass. No nodules.  SKIN: No rashes. No nodules.  EXTREMITIES: 2 + pulses, some mild swelling of his finger joints.  NEURO: Grossly intact.   ASSESSMENT/PLAN:  1. Cardiomyopathy, today I will increase his Coreg to 9.375 mg b.i.d.      We will try to titrate this and his ramipril over time.  2. Hypertension, we will manage this in the context of treating his      cardiomyopathy.  3. Coronary disease, he is having no ongoing symptoms. He had a      cardiac catheterization earlier this year and no further      cardiovascular testing is suggested.  4. Follow up, I will make sure he has follow up with his defibrillator      appropriate. I am going to see him in 1  month for his next      medication titration.     Rollene Rotunda, MD, [facility]  Electronically Signed    JH/MedQ  DD: 11/17/2006  DT: 11/17/2006  Job #: [id number]

## 2010-10-05 NOTE — Assessment & Plan Note (Signed)
[facility]                            CARDIOLOGY OFFICE NOTE   REYAANSH, [name]                      MRN:          [id number]  DATE:09/20/2006                            DOB:          1934-05-06    This is a 77 year old black male who was admitted to the hospital for  left upper arm deep vein thrombosis after being discharged home for an  AICD pacemaker.  The patient was admitted to the hospital and placed on  IV heparin and Coumadin, and is here for followup.  The patient's INR  apparently is high and he was asked to see if etodolac was the cause.  This certainly is not contraindicated, but should be avoided in patients  taking Coumadin, and I have asked him to contact Dr. Debby Bud to see if  there is an alternative arthritis medication for him while he is on the  Coumadin for 6 months.  The patient was very debilitated with his  arthritis symptoms prior to this medication.   Since the patient has been home from the hospital, he denies any  complaints.  His arm is no longer swollen.  He feels great.  His  defibrillator has not gone off.  He denies any chest pain, shortness of  breath, or palpitations.   CURRENT MEDICATIONS:  1. Fish oil b.i.d.  2. Centrum Silver daily.  3. Simvastatin 40 mg daily.  4. Etodolac 500 mg b.i.d.  5. Carvedilol 6.25 mg b.i.d.  6. Ramipril 5 mg daily.  7. Coumadin as directed.   PHYSICAL EXAM:  This is a very pleasant 77 year old white male in no  acute distress.  Blood pressure 122/72, pulse 62, weight 181.  NECK:  Without JVD, HJR, bruit, or thyroid enlargement.  LUNGS:  Decreased breath sounds at bases, but clear elsewhere.  HEART:  Regular rate and rhythm at 62 beats per minute.  Normal S1, S2.  No murmur, rub, bruit, thrill, or heave noted.  ABDOMEN:  Soft without organomegaly, masses, lesions, or abnormal  tenderness.  EXTREMITIES:  Without cyanosis, clubbing, or edema.  Good distal pulses.  The left  upper arm in particular has no swelling and he has good radial  pulse.   IMPRESSION:  1. Left upper deep vein thrombosis.  2. Coumadin therapy.  3. Status post implantable cardioverter defibrillator placement on      March of 2008.  4. Coronary artery disease status post coronary artery bypass grafting      in 1986.  Prior percutaneous coronary intervention in 1991 and      stenting in 1997.  5. A history of ventricular tachycardia on March 23, now with      implantable cardioverter defibrillator.  6. Cardiac catheterization in March 2008, ejection fraction 45%, left      internal mammary artery to the left anterior descending with      atretic saphenous vein graft to the posterior descending artery      with patent saphenous vein graft to the circumflex had a 40%      stenosis up to the ramus and the left anterior  descending had a 40%      mid stenosis.  7. Hypertension.  8. Dyslipidemia.   PLAN:  At this time, the patient is doing well from a cardiac  standpoint.  I have asked him to contact Dr. Debby Bud to see which  arthritis medication will be best for him to take since he will be on  Coumadin for at least 6 months.  He will see Dr. Antoine Poche back in  followup in 1 month.      Jacolyn Reedy, PA-C       Bevelyn Buckles. Bensimhon, MD    ML/MedQ  DD: 09/20/2006  DT: 09/20/2006  Job #: [id number]   cc:   Rosalyn Gess. Norins, MD

## 2010-10-05 NOTE — Op Note (Signed)
[name], [name] NO.:  [id number]   MEDICAL RECORD NO.:  [id number]          PATIENT TYPE:  INP   LOCATION:  2907                         FACILITY:  [facility]   PHYSICIAN:  Hillis Range, MD       DATE OF BIRTH:  10-29-1933   DATE OF PROCEDURE:  09/23/2008  DATE OF DISCHARGE:                               OPERATIVE REPORT   PREPROCEDURE DIAGNOSIS:  Persistent atrial fibrillation.   POSTPROCEDURE DIAGNOSIS:  Persistent atrial fibrillation.   PROCEDURE:  Cardioversion.   INTRODUCTION:  [name] is a 77 year old gentleman with a history of  persistent atrial fibrillation, ischemic cardiomyopathy, [place] Heart  Association class III heart failure, and recently diagnosed ventricular  tachycardia.  He has had progressive symptoms of fatigue and decreased  exercise tolerance.  He therefore presents for cardioversion.   DESCRIPTION OF THE PROCEDURE:  Informed written consent was obtained,  and the patient was brought to the electrophysiology lab in the fasting  state.  He was adequately sedated with intravenous Versed and fentanyl  as outlined in the nursing report.  The patient was noted to be in  atrial fibrillation upon presentation.  The patient was successfully  cardioverted to sinus rhythm with a single synchronized 200-joule  biphasic shock with cardioversion electrodes in the anterior-posterior  thoracic configuration.  He was converted to an atrial-paced rhythm.  There are no early apparent complications.   CONCLUSIONS:  1. Atrial fibrillation upon presentation.  2. Successful cardioversion to sinus rhythm with frequent atrial      pacing.  3. No early apparent complications.      Hillis Range, MD  Electronically Signed     JA/MEDQ  D:  09/23/2008  T:  09/24/2008  Job:  [id number]   cc:   Duke Salvia, MD, [facility]

## 2010-10-05 NOTE — Assessment & Plan Note (Signed)
[facility]                         ELECTROPHYSIOLOGY OFFICE NOTE   KATHY, WARES                      MRN:          [id number]  DATE:03/26/2007                            DOB:          [birth year]    [name] was seen in the clinic today, March 26, 2007, for follow up  of his St. Jude model number 2207-36 current.  Date of implant was August 15, 2006 for ischemic cardiomyopathy.  On interrogation of his device  today, his battery voltage is 3.20 with a charge time of 10.8 seconds.  P waves measured 1.7 mV with an atrial capture threshold of 0.75 volts  at 0.5 ms and an atrial lead impedance of 680 ohms.  R waves measured  11.9 mV with a ventricular pacing threshold of 1 volt at 0.5 ms and a  ventricular lead impedance of 590 ohms.  Shock impedance was 40.  There  was nonsustained episode noted since last interrogation and one mode  switch, totally less than 1% of the time.  He is on Coumadin therapy.  He ventricularly paces also less than 1% of the time.  No changes were  made in his parameters.  He will send a Merlin transmission in with a  return office visit in August of 2009.      Altha Harm, LPN  Electronically Signed      Duke Salvia, MD, [facility]  Electronically Signed   PO/MedQ  DD: 03/26/2007  DT: 03/26/2007  Job #: [id number]

## 2010-10-08 NOTE — Assessment & Plan Note (Signed)
[facility]                           PRIMARY CARE OFFICE NOTE   LAMERE, LIGHTNER                      MRN:          [id number]  DATE:08/03/2006                            DOB:          07-21-1933    [name] is a 77 year old Caucasian male with multiple medical  problems, who presents for followup evaluation and exam.  The patient  was last seen in the office June 28, 2006 for pain in his forearm and  wrist treated with Naprosyn.  He continues to have significant  discomfort.  He reports that he really has migrating arthralgias.  He  has had lab work including a negative rheumatoid factor, negative uric  acid level of 6.9, although he had a positive full-range CRP.   PAST MEDICAL HISTORY:  Surgical:  CABG in 1986 status 4 vessels.  He has  had PTCA of the circumflex in 1991.  Last cardiac testing was a gaited  spec wall motion stress Myoview May 05, 2004, which was clinically  nondiagnostic for ischemia, electrocardiographically nondiagnostic.  Perfusion data showed a large inferior defect unchanged from previous  studies, with no reversible defect.  Last 2D echo from March 25, 2005  with an EF of 40-50%.  The patient has had no other surgeries.  Medical illnesses:  1. The patient had the usual childhood diseases, including measles and      mumps as a child.  No STD.  2. Hypertension.  3. Hyperlipidemia.  4. Coronary artery disease.  5. Cardiomyopathy with a ejection fraction as noted.   CURRENT MEDICATIONS:  1. Atenolol 25 mg daily.  2. Lipitor 40 mg daily.  3. Altace 5 mg daily.  4. Fish oil 1000 mg b.i.d.  5. Aspirin 81 mg daily.  6. Sublingual nitroglycerine on a p.r.n. basis.   FAMILY HISTORY:  Noncontributory.   SOCIAL HISTORY:  The patient moved in 2002 from [place].  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  The patient is  married to is 4th wife, now at 12 years.  He has 3 sons and 1  daughter  from his first marriage, 2 adopted daughters from his third marriage.  He has 9 grandchildren and one great-grandchild.   REVIEW OF SYSTEMS:  The patient has had a 3 pound weight loss.  He has  had an eye exam in the last 6 months and is stable, followed for a  pterygium that is currently not obstructing his vision.  No ENT,  cardiovascular, respiratory, GI problems.  He has nocturia x2.  He has  migrating arthralgias as noted.  No dermatologic or neurologic  complaints or problems.   PHYSICAL EXAMINATION:  Temperature 97.4, blood pressure 118/68, pulse  83, weight 183.  GENERAL APPEARANCE:  This is a well-nourished, well-developed, slender  gentleman in no acute distress.  HEENT:  Normocephalic and atraumatic. The patient has a hearing aid in  his left ear.  Oropharynx with native dentition although missing several  teeth.  He has no buccal lesions.  Posterior pharynx is clear.  Conjunctiva and sclerae are clear.  Pupils equal, round and reactive to  light and accommodation.  The patient has a pterygium on the left eye,  the medial aspect which is not yet encroaching upon the pupil area.  NECK:  Supple, without thyromegaly.  No lymphadenopathy was noted in the  cervical, supraclavicular regions.  CHEST:  CVA tenderness.  LUNGS:  Clear to auscultation and percussion.  CARDIOVASCULAR:  2+ radial pulses, no JVD, no carotid bruits.  He has a  quiet precordium with a well-healed sternotomy scar.  He has a regular  rate and rhythm without murmurs, rubs or gallops to my exam.  ABDOMEN:  Scaphoid.  The patient has a ventral hernia at the area where  he had chest tubes inserted ventrally.  He has no organosplenomegaly, no  guarding or rebound, no tenderness.  GENITALIA:  Normal male phallus, bilaterally descended testicles with an  atrophic right testicle.  No sign of inguinal hernia.  RECTAL EXAM:  Normal sphincter tone was noted.  Prostate was smooth,  normal in size and contour  without nodules or abnormalities.  Stools  guaiac negative.  EXTREMITIES:  Without cyanosis, clubbing or edema, no deformities were  noted.   CHART REVIEW:  Last colonoscopy Oct 14, 2002 was negative.  Cardiovascular studies as noted.  Laboratory dating from October 25, 2005  with a uric acid of 6.9, rheumatoid factor less than 20.  Laboratory  July 27, 2005 with a normal hemoglobin of 14.4 grams, normal white  count.  Chemistries were unremarkable.  Lipid panel was excellent, with  an LDL of 75.  PSA was normal.   ASSESSMENT AND PLAN:  1. Cardiovascular.  The patient is stable with no complaints.  His      cardiologist, Dr. Andee Lineman, has moved to [facility] and the patient will be      reassigned to Dr. Antoine Poche with an appointment scheduled for August 16, 2006 at 2:30 p.m. and the patient is aware of this appointment.  2. Hypertension.  The patient's blood pressure is very well      controlled.  He will continue his present medications.  3. Lipids.  The patient's insurance company will not cover Lipitor as      a second tier drug.  Plan:  The patient is switched to Simvastatin      40 mg daily.  Will obtain followup laboratory in one month and make      sure he is still at goal, with an LDL cholesterol less than 80.  4. MFK.  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      There has been no real evidence for gout and labs have been      negative as normal.  Plan:  Discontinue Naprosyn and give a trial      of Etodolac 500 mg b.i.d.  5. Health maintenance.  The patient has had an unremarkable      examination.  Laboratories will be done in about 1 month, including      a PSA, liver functions and routine chemistries.   In summary, this is a very pleasant gentleman who is in good condition  for the condition that he is in.  He is asked to return to see me in one  year or on an p.r.n. basis.  He will see Dr. Antoine Poche as scheduled.     [name] Gess Norins, MD Electronically  Signed    MEN/MedQ  DD: 08/03/2006  DT: 08/04/2006  Job #: [id number]  cc:   Donzetta Matters [id number] [name], [address] [place], [place], [facility]

## 2010-10-08 NOTE — Discharge Summary (Signed)
NAMEHOLDAN, STUCKE NO.:  [id number]   MEDICAL RECORD NO.:  [id number]          PATIENT TYPE:  INP   LOCATION:  2920                         FACILITY:  [facility]   PHYSICIAN:  Rollene Rotunda, MD, FACCDATE OF BIRTH:  1934-03-17   DATE OF ADMISSION:  08/13/2006  DATE OF DISCHARGE:  08/16/2006                               DISCHARGE SUMMARY   ALLERGIES:   ALLERGIES:  He has no known drug allergies.   The time for this dictation greater than 35 minutes.   PRINCIPAL DIAGNOSIS:  1. Admitted with palpitation/sustained monomorphic ventricular      tachycardia.      a.     DCCV in the emergency room.      b.     Amiodarone therapy for quiescence x4 days now discontinued.  2. Discharging day one status post implantation of St. Jude CURRENT DR      RF dual-chamber cardioverter defibrillator. Defibrillator threshold      study less than or equal to 20 Joules.  3. Discharging day #2 status post left heart catheterization study      showed ejection fraction 45%. The LIMA was atretic, his saphenous      vein graft to the PDA was patent. The circumflex had a midpoint 40%      stenosis after a ramus and the LAD had a 40% midpoint stenosis      after the diagonal.  4. Left bundle branch block.  5. 2-D echocardiogram March 23.  Ejection fraction 45-50%, mild      diffuse left ventricular hypokinesis, mild focal nasal septal      hypertrophy. Mild to moderate mitral regurgitation, trivial      tricuspid regurgitation.  6. New [place] Heart Association class II chronic systolic congestive      heart failure.  7. Ischemic cardiomyopathy, ejection fraction 45%.   SECONDARY DIAGNOSES:  1. History of myocardial infarction, three-vessel coronary artery      disease, status post coronary artery bypass graft surgery in 1986.  2. Percutaneous transluminal coronary angioplasty in 1991.  3. Stents in 1997.  4. Hypertension.  5. Dyslipidemia.   PROCEDURES.:  1. 2-D echocardiogram  March 23 as dictated above, ejection fraction 45-      50%.  2. Left heart catheterization August 14, 2006, ejection fraction 45%,      inferior hypokinesis, inferoapical akinesis. The bypass from the      saphenous vein graft to the PDA is patent.  The left internal      mammary artery was atretic. Native vessel disease in the circumflex      and left anterior descending was mild and nonobstructive.  3. Implant St. Jude CURRENT DR RF dual-chamber cardioverter      defibrillator Dr. Sherryl Manges. Defibrillator threshold study less      than or equal to 20 Joules. No postprocedural complications.      Patient discharging post ICD implant day #1.   BRIEF HISTORY:  Mr. Tierno is a 77 year old male.  He awoke from sleep on  March 22 with a sensation that his lower gums were  burning.  On  becoming fully awake he was aware of chest pressure.  There was no  radiation, no dyspnea or diaphoresis.  He went to the bathroom but had  no relief.  He took one nitroglycerin and still no relief.  He had a  sensation that his heart was racing.  He came to the emergency room. Of  note, the patient has a history of prior coronary artery disease having  undergone coronary artery bypass graft surgery in 1986.  He also had  follow-up with cardiac interventions with a PTCA of the left circumflex  in 1991 and PCI of the left circumflex in 1997.   HOSPITAL COURSE:  The patient presented March 22 to the emergency room  with chest pressure and a feeling of heart racing. At the emergency room  it was found that he was in a wide complex tachycardia. A monomorphic  ventricular tachycardia, sustained with right bundle branch morphology  with a rate of 160 beats per minute. He did not respond to an adenosine  or lidocaine.  He was given IV amiodarone bolus 300 mg without response.  He was sedated and DCCV x1 restored to sinus rhythm.  The patient has  remained chest painfree throughout this hospitalization. A 2-D   echocardiogram was done March 23 with the finding of an ejection  fraction 45-50%. He was seen by electrophysiology recommending the left  heart catheterization followed by electrophysiology study with  implantation of cardioverter defibrillator if VT was inducible. The  patient underwent left heart catheterization, then on March 24 study as  dictated above with moderate stenoses. His native vessels, LAD, and left  circumflex are free of obstructive disease.  His right coronary artery  is occluded proximally but has a patent graft to the PDA and fills  retrograde. The suspicion by Dr. Samule Ohm is that the ventricular  tachycardia was a primary event and he will proceed on March 25 for  implantation of cardioverter defibrillator.  This was performed by Dr.  Graciela Husbands as mentioned above and St. Jude device was implanted without  complication.  The patient's atenolol was changed for Coreg this  admission and he goes home on the following medications:   DISCHARGE MEDICATIONS:  1. Altace 5 mg daily.  2. Zocor 40 mg daily at bedtime.  3. Enteric-coated aspirin 325 mg daily.  This is a new dose up from 81      mg  4. Coreg 6.25 mg twice daily.  A new medication. This replaces      atenolol which the patient is asked to stop.   FOLLOWUP:  1. He has follow-up with [facility] [address]      to see Dr. Antoine Poche Monday April 14 at noon.  He will have his      device incision checked at that time as well.  2. ICD reprogramming with Dr. Graciela Husbands Tuesday July 1 at noon.   LABORATORY DATA:  Blood work during this hospitalization.  His most  recent complete blood count March 25; hemoglobin 11.1, hematocrit 32,  white cells 7.6, platelets of 200.  Serum electrolytes on March 25;  sodium 139, potassium 4.3, chloride 106, carbonate 26, BUN is 9,  creatinine 0.98, glucose 95. Troponin I studies this admission were 0.04, then 3.03, then 2.19. BNP this admission on March 23 was 297, D-   dimer was 1.15.      Maple Mirza, PA      Rollene Rotunda, MD, [facility]  Electronically Signed    GM/MEDQ  D:  08/16/2006  T:  08/16/2006  Job:  [id number]   cc:   Duke Salvia, MD, [facility]  Rosalyn Gess. Norins, MD

## 2010-10-08 NOTE — Assessment & Plan Note (Signed)
[facility]                            CARDIOLOGY OFFICE NOTE   [name], [name]                        MRN:          [id number]  DATE:09/04/2006                            DOB:          1934/02/24    PRIMARY:  Dr. Debby Bud   REASON FOR PRESENTATION:  Evaluate patient with recent hospitalization  for sustained ventricular tachycardia.   HISTORY OF PRESENT ILLNESS:  The patient presents for followup after  being hospitalized on March 23 with wide-complex tachycardia found to be  ventricular tachycardia.  He underwent cardioversion in the emergency  room.  He did have a slight troponin elevation.  He subsequently had a  cardiac catheterization demonstrating his EF to be 45%.  LIMA to the LAD  was atretic, saphenous vein graft to the PDA was patent, circumflex had  a 40% stenosis after the ramus.  The LAD had a 40% mid stenosis.  The  patient underwent implant of a St. Jude dual-chamber cardioverter-  defibrillator by Dr. Graciela Husbands.   Following discharge he did relatively well until about 2 days ago.  He  started noticing left arm swelling and discomfort.  He has had no fevers  or chills.  He has had no chest pain.  He has been a little fatigued.  He has had no new shortness of breath.  Denies any PND or orthopnea.  He  had no trauma to his arm.   PAST MEDICAL HISTORY:  1. Myocardial infarction with three-vessel coronary artery bypass in      1986, PTCA in 1991 and stenting in 1997.  2. Hypertension.  3. Dyslipidemia.  4. Supraventricular tachycardia status post ICD placement.  5. Mildly reduced ejection fraction.   ALLERGIES:  None.   CURRENT MEDICATIONS:  1. Aspirin 325 mg daily.  2. Carvedilol 6.25 mg b.i.d.  3. Etodolac.  4. Simvastatin 40 mg daily.  5. Centrum Silver.  6. Fish oil.  7. Altace 5 mg daily.   REVIEW OF SYSTEMS:  As stated in the HPI and otherwise negative for  other systems.   PHYSICAL EXAMINATION:  GENERAL:  The  patient is in no distress.  VITAL SIGNS:  Blood pressure 132/80, heart rate 60 and regular, weight  184 pounds, body mass index 26.  HEENT:  Eyelids unremarkable.  Pupils equal, round, and react to light.  Fundi not visualized.  Oral mucosa unremarkable.  NECK:  No jugular venous distention at 45 degrees.  Carotid upstroke  brisk and symmetric.  No bruits, no thyromegaly.  LYMPHATICS:  No cervical, axillary or inguinal adenopathy.  LUNGS:  Clear to auscultation bilaterally.  BACK:  No costovertebral angle tenderness.  CHEST:  Well-healed ICD pocket without erythema, exudate, or hematoma;  mild ecchymosis.  HEART:  PMI not displaced or sustained.  S1 and S2 within normal limits.  No S3, no S4.  No clicks, no rubs, no murmurs.  ABDOMEN:  Flat, positive bowel sounds normal in frequency and pitch.  No  bruits, no rebound, no guarding, no midline pulsatile mass.  No  hepatomegaly, no splenomegaly.  SKIN:  No rashes, no nodules.  EXTREMITIES:  Show 2+ pulses.  The left arm is slightly swollen from his  hand up to his elbow.  There is no erythema or ecchymosis.  NEUROLOGIC:  Oriented to person, place and time.  Cranial nerves II-XII  grossly intact.  Motor grossly intact.   EKG:  Atrial paced rhythm with left bundle-branch block.   His defibrillator was interrogated. Atrial amplitude is 0.9, impedance  450, threshold 0.75 at 0.5.  The amplitude for the right ventricle is  10.9, impedance 490, threshold 0.5 at 0.5.  There were no changes to the  parameters.  His AV delay was 150.  It is DDDR mode.  Ventricular  fibrillation 240, ventricular tachycardia 200.   1. Left arm swelling:  The patient has left arm swelling which may be      thrombus.  He is going to get a venous Doppler today and may need      Coumadin.  2. Ventricular tachycardia:  The patient has a defibrillator in place      and has had no further episodes.  Will continue with medical      management of his cardiomyopathy.   3. Cardiomyopathy:  He is complaining of some fatigue so I will not up-      titrate his medicines but I will see him back in about 4 weeks for      medication titration.  4. Coronary disease:  He will continue with secondary risk reduction.     Rollene Rotunda, MD, [facility]  Electronically Signed    JH/MedQ  DD: 09/04/2006  DT: 09/04/2006  Job #: [id number]   cc:   Rosalyn Gess. Norins, MD

## 2010-10-08 NOTE — Consult Note (Signed)
NAMEDERWOOD, BECRAFT NO.:  [id number]   MEDICAL RECORD NO.:  [id number]          PATIENT TYPE:  INP   LOCATION:  2920                         FACILITY:  [facility]   PHYSICIAN:  Duke Salvia, MD, FACCDATE OF BIRTH:  1933/06/28   DATE OF CONSULTATION:  08/14/2006  DATE OF DISCHARGE:                                 CONSULTATION   Thank you very much for asking [place] to see [name] in consultation  for wide complex tachycardia.   [name] is a 77 year old gentleman who has history of coronary disease  that goes back to 1986 when he presented with myocardial infarction with  bypass surgery.  This was done in [place].  He has had intercurrent PCI of  the circumflex on two occasions and his last grafts were known to be  patent and this was confirmed by catheterization yesterday with a patent  vein graft to his RCA and atretic LIMA with nonobstructive disease in  his LAD.  Ejection fraction estimated to be 40-45% with inferior  hypokinesis and inferoapical akinesis.   In any case the patient awakened the night of admission with a feeling  of shortness of breath and chest discomfort that radiated to his jaw and  into his arm.  He took his pulse in his wrist and found it to be fast.  He had no associated palpitations.  Because of unresponse to  nitroglycerin EMS was called.  He is found to be in wide complex  tachycardia with a right bundle branch block morphology.  He was brought  to the emergency room where he was given adenosine, lidocaine,  amiodarone, and subsequently was submitted for synchronized  cardioversion restoring sinus rhythm, now with a left bundle branch  block morphology.   NO FURTHER DICTATION      Duke Salvia, MD, [facility]  Electronically Signed     SCK/MEDQ  D:  08/15/2006  T:  08/15/2006  Job:  [id number]   cc:   Rollene Rotunda, MD, Simone Curia. Norins, MD
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
## 2010-10-08 NOTE — Assessment & Plan Note (Signed)
[facility]                            CARDIOLOGY OFFICE NOTE   ZAYAAN, KOZAK                      MRN:          [id number]  DATE:05/27/2006                            DOB:          1934-05-11    PRIMARY CARE PHYSICIAN:  Dr. Illene Regulus   REASON FOR PRESENTATION:  Evaluate the patient with ischemic  cardiomyopathy.   HISTORY OF PRESENT ILLNESS:  The patient is 77 years old.  He presents  for followup of the above.  He has had a little bit of swelling in both  of his feet since I last saw him.  He also had some pain in his right  great toe.  He described some redness.  This was 3 weeks ago and is  slowly resolving, though it is still a little bit tender.  He had never  had this before.  He has never been diagnosed with gout.  From a  cardiovascular standpoint, he has had a little bit of dizziness but no  presyncope or syncope.  He has had no palpitations.  He has had no ICD  firings.  He denies any chest discomfort, neck or arm discomfort.  He  has had no new shortness of breath and denies any PND or orthopnea.   PAST MEDICAL HISTORY:  1. Myocardial infarction with three-vessel coronary artery bypass in      1986.  2. PTCA in 1991.  3. Stenting in 1997.  4. Mild to moderate cardiomyopathy with an EF of 45%.  5. Paroxysmal atrial fibrillation.  6. Ventricular tachycardia status post defibrillator placement.  7. Hypertension.  8. Dyslipidemia.   ALLERGIES:  None.   MEDICATIONS:  1. Fish oil 1000 mg b.i.d.  2. Centrum Silver.  3. Simvastatin 40 mg daily.  4. Ramipril 5 mg daily.  5. Coumadin.  6. Metoprolol 150 mg daily.   REVIEW OF SYSTEMS:  As stated in the HPI and otherwise negative for  other systems.   PHYSICAL EXAMINATION:  The patient is in no distress.  Blood pressure 120/77, heart rate 69 and regular, weight 201 pounds,  body mass index 26.  HEENT:  Eyes unremarkable, pupils equal, round, and reactive to light,  fundi not visualized.  NECK:  No jugular venous distention at 45 degrees, carotid upstroke  brisk and symmetric, no bruits, no thyromegaly.  LYMPHATICS:  No adenopathy.  CHEST:  Well-healed pacemaker ICD pocket.  LUNGS:  Clear to auscultation bilaterally.  HEART:  PMI not displaced or sustained.  S1 and S2 within normal limits.  No S3, no S4.  No clicks, no rubs, no murmurs.  ABDOMEN:  Flat, positive bowel sounds normal in frequency and pitch.  No  bruits, no rebound, no guarding.  No midline pulsatile mass.  No  organomegaly.  SKIN:  No rashes, no nodules.  EXTREMITIES:  2+ pulses throughout, no edema.   1. Cardiomyopathy.  The patient is having some mild dizziness and does      describe some fatigue.  Therefore, I will leave him on his current      regimen and not try to  up-titrate further with his beta blocker.  2. Toe pain.  This well may have been gout.  I will check a uric acid      level.  It is improving, so I will not treat him.  I told him to      present to his primary care doctor if he has any acute episodes.  I      did not sense any edema and do not think this is cardiac.  3. Ventricular tachycardia.  He has a defibrillator followed by Dr.      Graciela Husbands.  He is supposed to have the Merlin device for home      transmission, and we checked on this today.  They will need to      reorder it.  4. Atrial fibrillation.  He has had asymptomatic episodes.  He will      remain on Coumadin.  5. Followup.  I will see him back in 6 months or sooner if needed.     Rollene Rotunda, MD, [employer] [facility]  Electronically Signed    JH/MedQ  DD: 05/28/2007  DT: 05/28/2007  Job #: [id number]   cc:   Rosalyn Gess. Norins, MD

## 2010-10-08 NOTE — Cardiovascular Report (Signed)
NAMEANTJUAN, ROTHE NO.:  [id number]   MEDICAL RECORD NO.:  [id number]          PATIENT TYPE:  INP   LOCATION:  2920                         FACILITY:  [facility]   PHYSICIAN:  Salvadore Farber, MD  DATE OF BIRTH:  1933-05-31   DATE OF PROCEDURE:  DATE OF DISCHARGE:                            CARDIAC CATHETERIZATION   Audio too short to transcribe (less than 5 seconds)      Salvadore Farber, MD     WED/MEDQ  D:  08/14/2006  T:  08/14/2006  Job:  [id number]

## 2010-10-08 NOTE — H&P (Signed)
NAMEWILMA, [name] NO.:  [id number]   MEDICAL RECORD NO.:  [id number]          PATIENT TYPE:  INP   LOCATION:  2920                         FACILITY:  [facility]   PHYSICIAN:  Rollene Rotunda, MD, FACCDATE OF BIRTH:  1933/11/29   DATE OF ADMISSION:  08/13/2006  DATE OF DISCHARGE:                              HISTORY & PHYSICAL   CARDIOLOGIST:  Dr. Rollene Rotunda.   PRIMARY CARE [name] [name]:  Dr. Illene Regulus.   CHIEF COMPLAINT:  Heart racing.   HISTORY OF PRESENT ILLNESS:  Mr. [name] [name] is a very pleasant 77 year old  male with coronary artery disease status post remote CABG in 1986, who  awoke from sleep approximately 11:30 p.m. and felt a burning sensation  in his chest.  He got up to walk to the bathroom and noticed that his  heart was also racing, and took his pulse and found it to be  significantly elevated.  He took a nitroglycerin without response.  He  denies any lightheadedness, syncope or near syncope.  However, because  of the persistent symptoms he called EMS, and was found to be in wide  complex tachycardia at 160 b.p.m. when they arrived.  He remained  hemodynamically stable throughout EMS transport and on arrival in the  emergency department remained in wide complex tachycardia at 160 b.p.m.  with a right bundle branch block morphology, with blood pressure 111/75.  He was given IV adenosine without response in the emergency room,  followed by IV lidocaine without response, followed by 300 mg of IV  amiodarone without response.  At that time, he remained hemodynamically  stable, and therefore was sedated and was dc cardioverted with 75 joules  x1, with restoration of sinus rhythm.  Upon restoration of sinus rhythm,  his QRS axis showed a left bundle branch block morphology.  He is now  chest pain free after restoration of sinus rhythm   PAST MEDICAL HISTORY:  1. Coronary artery disease status post coronary artery bypass grafts      in 1986.  Per  office records, this surgery was in [place], and we do      not know the graft anatomy at this time.  Per Dr. Debby Bud' notes, he      had an angioplasty of the left circumflex in 1991 and a stent      placement to the native left circumflex in 1997.  He has not had a      cardiac catheterization performed here since he moved here a few      years ago.  His last nuclear stress study was in 2005.  His last      echocardiogram was in 2006 that showed an ejection fraction of 45%.      There is no prior EKG for review here at [facility], but      in Dr. Jenene Slicker clinic note, it indicates that his morphology is      left bundle branch block at baseline.  2. Hyperlipidemia.  3. Mitral regurgitation, moderate in 2006.  4. Hypertension.  5. Osteoarthritis.   MEDICATIONS:  1. Aspirin  81 mg p.o. once a day.  2. Altace 5 mg p.o. once a day.  3. Atenolol 25 mg p.o. once a day.  4. Simvastatin 40 mg p.o. once a day.  5. Arthritis medicine, patient unsure of specific medication or      dose.   NO KNOWN DRUG ALLERGIES.   SOCIAL HISTORY:  Patient continues to smoke a couple of cigars a day.  He quit smoking cigarettes in 1996, but had an extensive history prior  to that time.   FAMILY HISTORY:  Father died at age 26, patient uncertain of cause.  Mother living at age 31.  He denies any known history of coronary artery  disease in first degree relatives.   REVIEW OF SYSTEMS:  Positive for arthritis pain, worse in his hands  and wrists.  Otherwise, 10 systems reviewed and negative other than as  noted above in the HPI.   PHYSICAL EXAM:  Blood pressure 96/51, pulse 50, oxygen saturation 99% on  2L nasal cannula.  GENERAL:  Patient breathing comfortably in no apparent distress.  HEENT:  Normocephalic/atraumatic, extraocular movements intact, sclera  are anicteric.  NECK:  Supple, no masses appreciated, no carotid bruits or jugular  venous distention.  CARDIOVASCULAR:  Regular rate and  rhythm, normal S1 and S2, no murmurs,  rubs or gallops.  CHEST:  Clear to auscultation bilaterally.  ABDOMEN:  Soft, nontender/nondistended, normal bowel sounds.  EXTREMITIES:  Warm, no edema, 2+ dorsalis pedis pulses bilaterally.  SKIN:  No rashes.  MUSCULOSKELETAL:  No joint effusions or erythema.  NEUROLOGIC:  Cranial nerves II-XII grossly intact, moving all  extremities well.  PSYCHIATRIC:  Alert and oriented x3, affect pleasant and appropriate.   EKG (pre-cardioversion):  Wide complex tachycardia with right bundle  branch block morphology at 161 b.p.m.   EKG post-cardioversion:  Sinus bradycardia with premature atrial  contractions, 52 b.p.m., left bundle branch block, left axis deviation.   LABORATORY VALUES:  White blood cell count 7.7, hemoglobin 12.6,  hematocrit 37, platelets 218,000.  Sodium 139, potassium 3.9, chloride  108, bicarbonate 22, BUN 20, creatinine 1, glucose 101.  Troponin less  than 0.05.   ASSESSMENT PLAN:  A 77 year old male with coronary artery disease status  post coronary artery bypass graft and a history of mild ischemic  cardiomyopathy with new onset wide complex tachycardia.  Given the right  bundle branch block morphology of this rhythm as compared to the left  bundle branch block morphology of his sinus rhythm, and the fact that  there was no slowing of the rate when given adenosine, along with his  history of coronary disease and left ventricular dysfunction, I suspect  that this is probably ventricular tachycardia.  1. Acute coronary syndrome seems unlikely, but we need to rule out      myocardial infarction.  We will maintain his home aspirin dose, and      therapeutic heparin is not indicated at this time.  2. We will initiate an IV load with amiodarone at 1 mg per minute for      6 hours followed by 0.5 mg per minute for the following 18 hours.  3. He will likely need an ischemia evaluation.  He had been scheduled     for an outpatient  exercise nuclear study the first week of April      with Dr. Antoine Poche.  Given this event, it could be considered to      proceed directly with cardiac catheterization.  We will also  need      to reassess his left ventricular function and ejection fraction.  4. He will likely need an evaluation by one of our      electrophysiologists.  If his ejection fraction is less than or      equal to 35%, he will qualify for an implantable cardioverter-      defibrillator.  If his ejection fraction is not low      enough to qualify for an implantable cardioverter-defibrillator, he      might be a candidate for an electrophysiology study to further      elucidate the mechanism of this rhythm and determine qualification      for implantable cardioverter-defibrillator.      Lowella Bandy, MD   Electronically Signed     ______________________________  Rollene Rotunda, MD, [facility]    JJC/MEDQ  D:  08/13/2006  T:  08/13/2006  Job:  [id number]

## 2010-10-08 NOTE — Cardiovascular Report (Signed)
[name], [name] NO.:  [id number]   MEDICAL RECORD NO.:  [id number]          PATIENT TYPE:  INP   LOCATION:  2920                         FACILITY:  [facility]   PHYSICIAN:  Salvadore Farber, MD  DATE OF BIRTH:  [birth year]   DATE OF PROCEDURE:  08/14/2006  DATE OF DISCHARGE:                            CARDIAC CATHETERIZATION   PROCEDURE:  Left heart catheterization, left ventriculography, coronary  angiography, LIMA and saphenous vein graft angiography.   INDICATIONS:  Mr. Westrup is a 77 year old gentleman who underwent  coronary artery bypass grafting in 51 in [place].  He is also status  post percutaneous intervention on the circumflex in 1996.  He now  presents with sustained ventricular tachycardia without chest pain.  However, troponin rose to 3.  We were asked to perform coronary  angiography.   PROCEDURAL TECHNIQUE:  Informed consent was obtained.  Under 1%  lidocaine local anesthesia, a 5-French sheath was placed in the right  common femoral artery using the modified Seldinger technique.  Diagnostic angiography of the native system was performed using JL-4 and  JR-4 catheters.  The JR-4 catheter was used to selectively engage the  single venous bypass graft.  LIMA angiography was performed using a LIMA  catheter.  Left heart catheterization and ventriculography were  performed using a pigtail catheter.  The patient tolerated the procedure  well and was transferred to holding room in stable condition.  Sheath  will be removed there.   COMPLICATIONS:  None.   FINDINGS:  1. LV:  153/12/20.  EF approximately 45% with inferior hypokinesis and      inferoapical akinesis.  2. Left main:  Angiographically normal.  3. LAD:  Moderate-sized vessel giving rise to a single diagonal.  The      mid vessel has a 40% stenosis.  The LIMA to LAD is atretic but      there are no stenoses which should impair flow to the distal      vessel.  4. Circumflex:   Moderate-sized vessel giving rise to two marginals.      The mid vessel has a 40% stenosis.  5. RCA:  Vessel is totally occluded proximally.  There is a patent      vein graft to the PDA.  This fills the PLV in a retrograde fashion.      The vein graft has two discrete moderate stenoses of 40% and 50%      narrowing.  The PLV has a 70% proximal stenosis.   IMPRESSION/RECOMMENDATION:  The patient has only moderate stenoses  within the vein graft to the right coronary artery as well as within the  native left anterior descending artery and circumflex.  The left  internal mammary artery graft is atretic but the left anterior  descending artery has no significant stenoses.  Left ventricular  systolic function is moderately impaired.   Based on these findings as well as a clinical course, I suspect the  ventricular tachycardia was the primary event.  Electrophysiology is to  see him in consultation later today.      Salvadore Farber,  MD  Electronically Signed     WED/MEDQ  D:  08/14/2006  T:  08/14/2006  Job:  [id number]

## 2010-10-08 NOTE — Discharge Summary (Signed)
[name], [name] NO.:  [id number]   MEDICAL RECORD NO.:  [id number]          PATIENT TYPE:  INP   LOCATION:  2025                         FACILITY:  [facility]   PHYSICIAN:  Rollene Rotunda, MD, FACCDATE OF BIRTH:  [birth year]   DATE OF ADMISSION:  09/04/2006  DATE OF DISCHARGE:  09/08/2006                               DISCHARGE SUMMARY   PRIMARY CARDIOLOGIST:  Dr. Rollene Rotunda.   PRIMARY CARE PHYSICIAN:  Dr. Rosalyn Gess. Norins.   PROCEDURES PERFORMED DURING HOSPITALIZATION:  None.   DISCHARGE DIAGNOSES:  1. Left upper arm deep venous thrombosis.  2. Status post ICD placement on March of 2008.  3. Coronary artery disease.      a.     Myocardial infarction with 3-vessel coronary artery bypass       grafting, 1986.      b.     Percutaneous transluminal coronary angioplasty in 1991.      c.     Stenting 1997.  4. Wide complex tachycardia, found to be ventricular tachycardia,      diagnosed on August 13, 2006, status post cardioversion in the      emergency room.  5. History of cardiac catheterization, March of 2008, revealing      ejection fraction 45%, left internal mammary artery to left      anterior descending with atretic, saphenous vein graft to posterior      descending artery patent, circumflex had 40% stenosis up to the      ramus, the left anterior descending had 40% mid stenosis.  6. Implantation of St. Jude dual chamber cardioverter defibrillator by      Dr. Graciela Husbands.   SECONDARY DIAGNOSES:  1. Hypertension.  2. Dyslipidemia.  3. Mildly reduced ejection fraction.   HISTORY AND PHYSICAL:  This is a 77 year old Caucasian male who was seen  secondary to swelling in the left arm and hand after being discharged  home for AICD pacemaker.  The patient noted pain and swelling in his  lower left arm and upper left arm.  He was seen in the office, as a  result of this, by Dr. Antoine Poche, who admitted him to rule out thrombus.   Upper extremity Doppler  ultrasound did reveal positive for thrombus.  The patient was admitted and placed on heparin with pharmacy following  and institution of Coumadin therapy.   The patient tolerated heparin and dosing of Coumadin without evidence of  bleeding, adverse reaction.  The swelling in the left upper arm  decreased significantly without pain.  The patient remained in the  hospital with heparin bridging until Coumadin INR was therapeutic.  He  was followed very closely by Dr. Antoine Poche throughout hospitalization.  On day of discharge, the patient was seen and examined by Dr. Antoine Poche.  His PT was found to be 29.1, INR 2.6 on Coumadin dosing at 5 mg a day.  The patient was found to be stable for discharge with followup in the  Coumadin Clinic and Dr. Jenene Slicker office thereafter.  The patient was  anxious to return home and had had no further complaints  of left arm  pain.   DISCHARGE LABS:  Hemoglobin 10.8, hematocrit 29.8, white blood cell is  4.7, platelets 246.  PT 29.1, INR 2.6.  Sodium 141, potassium 4.0,  chloride 106, CO2 26, BUN 16, creatinine 0.9, glucose 92.   DISCHARGE MEDICATIONS:  1. Altace 5 mg daily.  2. Carvedilol 6.25 mg b.i.d.  3. Coumadin 4 mg daily (to follow with Coumadin Clinic for      adjustment).  4. Simvastatin 40 mg daily at bedtime.  5. Etodolac as at home.  6. Fish oil.  7. Centrum Silver.  8. Patient is to stop aspirin.   FOLLOWUP PLANS AND APPOINTMENTS:  1. The patient is to follow up with Dr. Antoine Poche on April 30 at 12:45      p.m. for a postdischarge evaluation, continuation of cardiac care.  2. The patient is to follow up with the Coumadin Clinic on Monday,      September 11, 2006 at 2:45 p.m. for continuation of PT/INR and Coumadin      adjustment as necessary.  3. The patient has been advised to stop taking aspirin as directed.  4. The patient has been given a prescription for Coumadin 4 mg p.o.      daily.   Time spent with the patient, includes physician  time, 30 minutes.      Bettey Mare. Lyman Bishop, NP      Rollene Rotunda, MD, [facility]  Electronically Signed    KML/MEDQ  D:  09/08/2006  T:  09/09/2006  Job:  [id number]

## 2010-10-08 NOTE — Op Note (Signed)
[name], [name] NO.:  [id number]   MEDICAL RECORD NO.:  [id number]          PATIENT TYPE:  INP   LOCATION:  2920                         FACILITY:  [facility]   PHYSICIAN:  Duke Salvia, MD, FACCDATE OF BIRTH:  Oct 09, 1933   DATE OF PROCEDURE:  08/15/2006  DATE OF DISCHARGE:                               OPERATIVE REPORT   PREOPERATIVE DIAGNOSES:  1. Ventricular tachycardia.  2. Ischemia cardiomyopathy.  3. Prior history of syncope.   POSTOPERATIVE DIAGNOSES:  1. Ventricular tachycardia.  2. Ischemia cardiomyopathy.  3. Prior history of syncope.   PROCEDURE:  Dual-chamber defibrillator implantation with intraoperative  defibrillation threshold testing   DESCRIPTION OF PROCEDURE:  Following obtaining informed consent, the  patient was brought to the electrophysiology laboratory and placed on  the fluoroscopic table in supine position.  After routine prep and drape  of the left upper chest lidocaine was infiltrated into the preperitoneal  subclavicular region.  Incision was made and carried down to the layer  of the preperitoneal fascia.  Using electrocautery and sharp dissection  a pocket was formed similarly, hemostasis was obtained.   Thereafter, attention was turned to gain access to the thoracic  subclavian vein, which was accomplished without difficulty without the  aspiration or puncture of the artery.  Two separate venipunctures were  accomplished, guidewire's were placed and retained and a zero silk  suture was placed in a figure-of-eight fashion, allowed to dangle  loosely.   Sequentially an 8 and then 7 [place] tearaway were introduced and sheaths  were placed, which were passed through a St. Jude 7120 dual-chamber  active fixation defibrillator lead serial number [id number] and a St. Jude  1688 TC 52 cm active fixation atrial lead serial number [id number].  Under  fluoroscopic guidance these were manipulated into the right ventricular  apex  with the right atrial appendage remnant, respectfully, where the  bipolar R wave was 14 with a pacing impendence of 653 ohms with a  threshold of 0.6 volts and 0.5 milliseconds of current, threshold was  0.8 MA.  The current of injury was brisk.  There was no diaphragmatic  pacing at 10 volts.   The bipolar P wave was 3.9 with a pacing impendence of 558, a threshold  of 0.9 volts at 0.5 milliseconds of current, threshold was 1.6 MA and  the current of injury was brisk.   With these acceptable parameters recorded, high voltage testing was  undertaken with a proximal coil impendence of 44, distal coil impendence  of 48.   Through the device, a bipolar P wave was 3.2 with a pacing impendence of  500 ohms and a threshold of 0.7 volts at 0.5 milliseconds.  The R wave  was 11.9 with a pacing impendence of 500 ohms and a threshold of 0.5  volts.  The high voltage impedances were taken through the device  actually.   With these acceptable parameters recorded, defibrillation actual testing  was undertaken.  Ventricular fibrillation was induced using T wave  shock.  After a total duration of 6 seconds at 15 joule shock was  delivered  through a measure of resistance of 31 ohms terminating  ventricular fibrillation and causing atrial fibrillation.  The patient  then was submitted to a 200 joule external shock restoring sinus rhythm.   The patient's history of atrial fibrillation was not known.   At this point the device was implanted.  The pocket was copiously  irrigated with antibiotic contained saline solution.  Hemostasis was  assured.  The leads and the pulse generator were placed in the pocket  and secured to preperitoneal fascia.  The wound was closed in three  layers in the normal fashion.  The wound was washed and dried and a  benzoin Steri-Strip dressing was applied.  Needle counts, sponge counts  and instrument counts were correct at the end of the procedure according  to the staff.   The patient tolerated the procedure without apparent  complications.   The patient's device is programmed in the DDDR mode at rates of 60-120.  His ET rates are programmed at 150-200-240.   The patient tolerated the procedure without apparent complications.      Duke Salvia, MD, [facility]  Electronically Signed     SCK/MEDQ  D:  08/15/2006  T:  08/15/2006  Job:  [id number]   cc:   Rollene Rotunda, MD, [facility]  Electrophysiology Laboratory  [facility]

## 2010-10-14 ENCOUNTER — Ambulatory Visit (INDEPENDENT_AMBULATORY_CARE_PROVIDER_SITE_OTHER): Payer: Medicare Other | Admitting: *Deleted

## 2010-10-14 DIAGNOSIS — I4891 Unspecified atrial fibrillation: Secondary | ICD-10-CM

## 2010-10-14 LAB — POCT INR: INR: 2.5

## 2010-11-08 ENCOUNTER — Other Ambulatory Visit: Payer: Self-pay | Admitting: Internal Medicine

## 2010-11-11 ENCOUNTER — Ambulatory Visit (INDEPENDENT_AMBULATORY_CARE_PROVIDER_SITE_OTHER): Payer: Medicare Other | Admitting: *Deleted

## 2010-11-11 DIAGNOSIS — I4891 Unspecified atrial fibrillation: Secondary | ICD-10-CM

## 2010-11-11 LAB — POCT INR: INR: 3.6

## 2010-12-02 ENCOUNTER — Ambulatory Visit (INDEPENDENT_AMBULATORY_CARE_PROVIDER_SITE_OTHER): Payer: Medicare Other | Admitting: *Deleted

## 2010-12-02 DIAGNOSIS — I4891 Unspecified atrial fibrillation: Secondary | ICD-10-CM

## 2010-12-06 ENCOUNTER — Other Ambulatory Visit: Payer: Self-pay | Admitting: Internal Medicine

## 2010-12-08 ENCOUNTER — Telehealth: Payer: Self-pay | Admitting: Internal Medicine

## 2010-12-08 NOTE — Telephone Encounter (Signed)
Phar faxed 2 day ago for creatine clearance for the Tikosyn.  This has not been received and medication cannot be filled until this is received. Verbal response will be fine.  Please call ASAP with this info.

## 2010-12-08 NOTE — Telephone Encounter (Signed)
I spoke with Gaspar Bidding from the pharmacy. Info given to her to calculate the creatinine clearance. Per Bonita Quin, the patient is ok for his current dose of Tikosyn and they will get this sent out to him.

## 2010-12-23 ENCOUNTER — Ambulatory Visit (INDEPENDENT_AMBULATORY_CARE_PROVIDER_SITE_OTHER): Payer: Medicare Other | Admitting: *Deleted

## 2010-12-23 ENCOUNTER — Encounter: Payer: Self-pay | Admitting: Internal Medicine

## 2010-12-23 ENCOUNTER — Other Ambulatory Visit: Payer: Self-pay | Admitting: Internal Medicine

## 2010-12-23 DIAGNOSIS — Z95810 Presence of automatic (implantable) cardiac defibrillator: Secondary | ICD-10-CM

## 2010-12-23 DIAGNOSIS — I2589 Other forms of chronic ischemic heart disease: Secondary | ICD-10-CM

## 2010-12-23 DIAGNOSIS — I255 Ischemic cardiomyopathy: Secondary | ICD-10-CM

## 2010-12-23 DIAGNOSIS — I4891 Unspecified atrial fibrillation: Secondary | ICD-10-CM

## 2010-12-23 LAB — REMOTE ICD DEVICE
BAMS-0001: 150 {beats}/min
HV IMPEDENCE: 37 Ohm
TZAT-0004SLOWVT: 12
TZAT-0012SLOWVT: 250 ms
TZAT-0013SLOWVT: 10
TZAT-0018SLOWVT: NEGATIVE
TZAT-0019SLOWVT: 7.5 V
TZON-0003SLOWVT: 430 ms
TZON-0004SLOWVT: 50
TZON-0010SLOWVT: 80 ms
TZST-0001SLOWVT: 3
TZST-0001SLOWVT: 5
TZST-0003SLOWVT: 25 J

## 2010-12-29 NOTE — Progress Notes (Signed)
icd remote check  

## 2010-12-30 ENCOUNTER — Ambulatory Visit (INDEPENDENT_AMBULATORY_CARE_PROVIDER_SITE_OTHER): Payer: Medicare Other | Admitting: *Deleted

## 2010-12-30 DIAGNOSIS — I4891 Unspecified atrial fibrillation: Secondary | ICD-10-CM

## 2010-12-30 LAB — POCT INR: INR: 3

## 2011-01-12 ENCOUNTER — Encounter: Payer: Self-pay | Admitting: *Deleted

## 2011-01-27 ENCOUNTER — Ambulatory Visit (INDEPENDENT_AMBULATORY_CARE_PROVIDER_SITE_OTHER): Payer: Medicare Other | Admitting: *Deleted

## 2011-01-27 DIAGNOSIS — I4891 Unspecified atrial fibrillation: Secondary | ICD-10-CM

## 2011-01-27 LAB — POCT INR: INR: 2.4

## 2011-01-31 ENCOUNTER — Other Ambulatory Visit: Payer: Self-pay | Admitting: Internal Medicine

## 2011-02-24 ENCOUNTER — Ambulatory Visit (INDEPENDENT_AMBULATORY_CARE_PROVIDER_SITE_OTHER): Payer: Medicare Other | Admitting: *Deleted

## 2011-02-24 DIAGNOSIS — I4891 Unspecified atrial fibrillation: Secondary | ICD-10-CM

## 2011-03-24 ENCOUNTER — Ambulatory Visit (INDEPENDENT_AMBULATORY_CARE_PROVIDER_SITE_OTHER): Payer: Medicare Other | Admitting: *Deleted

## 2011-03-24 DIAGNOSIS — I4891 Unspecified atrial fibrillation: Secondary | ICD-10-CM

## 2011-03-24 DIAGNOSIS — I255 Ischemic cardiomyopathy: Secondary | ICD-10-CM

## 2011-03-24 DIAGNOSIS — Z7901 Long term (current) use of anticoagulants: Secondary | ICD-10-CM

## 2011-03-24 DIAGNOSIS — I2589 Other forms of chronic ischemic heart disease: Secondary | ICD-10-CM

## 2011-03-24 DIAGNOSIS — Z95810 Presence of automatic (implantable) cardiac defibrillator: Secondary | ICD-10-CM

## 2011-03-25 ENCOUNTER — Encounter: Payer: Self-pay | Admitting: Internal Medicine

## 2011-04-08 NOTE — Progress Notes (Signed)
icd remote check  

## 2011-04-22 ENCOUNTER — Encounter: Payer: Medicare Other | Admitting: Internal Medicine

## 2011-05-05 ENCOUNTER — Encounter: Payer: Medicare Other | Admitting: *Deleted

## 2011-05-06 ENCOUNTER — Encounter: Payer: Self-pay | Admitting: Internal Medicine

## 2011-05-06 ENCOUNTER — Ambulatory Visit (INDEPENDENT_AMBULATORY_CARE_PROVIDER_SITE_OTHER): Payer: Medicare Other | Admitting: *Deleted

## 2011-05-06 ENCOUNTER — Ambulatory Visit (INDEPENDENT_AMBULATORY_CARE_PROVIDER_SITE_OTHER): Payer: Medicare Other | Admitting: Internal Medicine

## 2011-05-06 VITALS — BP 111/78 | HR 59 | Ht 70.0 in | Wt 200.0 lb

## 2011-05-06 DIAGNOSIS — I472 Ventricular tachycardia, unspecified: Secondary | ICD-10-CM

## 2011-05-06 DIAGNOSIS — I4891 Unspecified atrial fibrillation: Secondary | ICD-10-CM

## 2011-05-06 DIAGNOSIS — I255 Ischemic cardiomyopathy: Secondary | ICD-10-CM | POA: Insufficient documentation

## 2011-05-06 DIAGNOSIS — I2589 Other forms of chronic ischemic heart disease: Secondary | ICD-10-CM

## 2011-05-06 DIAGNOSIS — Z7901 Long term (current) use of anticoagulants: Secondary | ICD-10-CM

## 2011-05-06 DIAGNOSIS — I340 Nonrheumatic mitral (valve) insufficiency: Secondary | ICD-10-CM

## 2011-05-06 DIAGNOSIS — Z95810 Presence of automatic (implantable) cardiac defibrillator: Secondary | ICD-10-CM

## 2011-05-06 DIAGNOSIS — I059 Rheumatic mitral valve disease, unspecified: Secondary | ICD-10-CM

## 2011-05-06 NOTE — Assessment & Plan Note (Signed)
The patient's device was interrogated.  The information was reviewed. No changes were made in the programming.   VT-11 episodes all successully treated and AFib as noted

## 2011-05-06 NOTE — Assessment & Plan Note (Signed)
Intercurrent AFIB  About 20%  Not too bad Tikosyn labs will be drawn with PE with Dr Debby Bud

## 2011-05-06 NOTE — Progress Notes (Signed)
  HPI  [name] is a 77 y.o. male   seen in followup for ventriucular tachycardia and atrial fibrillation s/p ICD implantation with both appropriate and inappropriate therapy. He has a history of ischemic heart disease with prior CABG and moderate depression of LV systolic function.  Tikosyn was started About a year ago. He continues to have less atrial fibrillation about 25% at time but very few palpitations. There has been no ICD discharges He has had no chest pain or significant shortness of breath.  There's been no peripheral edema palpitation; He has been diagnosed with arthritis in his feet  Past Medical History  Diagnosis Date  . DVT (deep venous thrombosis)   . Ischemic cardiomyopathy     CABG vx4 1986 and PTCA in 1991; stent 1997 cath 2008  . Dyslipidemia   . HTN (hypertension)   . Atrial fibrillation     On Tikosyn  . Ventricular tachycardia     With prior shock therapy and antitachycardia pacing  . Automatic implantable cardiac defibrillator in situ 3/08    St. Jude  . History of colonoscopy 10/14/2002    Past Surgical History  Procedure Date  . Cardiac defibrillator placement 3/08    St. Jude  . Ptca 1997  . Coronary artery bypass graft 1996    last cath reports an atretic LIMA to the LAD and patent SVG to RCA    Current Outpatient Prescriptions  Medication Sig Dispense Refill  . CRESTOR 40 MG tablet Take 1 tablet by mouth daily. STOP SIMVASTATIN      . DILT-XR 120 MG 24 hr capsule TAKE 1 CAPSULE DAILY  90 capsule  1  . fish oil-omega-3 fatty acids 1000 MG capsule Take 1 g by mouth 2 (two) times daily.        . metoprolol (LOPRESSOR) 100 MG tablet TAKE 1 TABLET TWICE A DAY  180 tablet  2  . Multiple Vitamin (MULTIVITAMIN) tablet Take 1 tablet by mouth daily.        . nitroGLYCERIN (NITROSTAT) 0.4 MG SL tablet Place 0.4 mg under the tongue every 5 (five) minutes as needed. For chest pain.       . ramipril (ALTACE) 5 MG capsule TAKE 1 CAPSULE DAILY  90  capsule  1  . TIKOSYN 500 MCG capsule TAKE 1 CAPSULE TWICE A DAY  180 capsule  2  . warfarin (COUMADIN) 4 MG tablet TAKE AS DIRECTED BY ANTICOAGULATION CLINIC  90 tablet  2    No Known Allergies  Review of Systems negative except from HPI and PMH  Physical Exam Well developed and well nourished in no acute distress HENT normal E scleral and icterus clear Neck Supple JVP flat; carotids brisk and full Clear to ausculation Regular rate and rhythm, no murmurs gallops or rub Soft with active bowel sounds No clubbing cyanosis none Edema Alert and oriented, grossly normal motor and sensory function Skin Warm and Dry  ECG dated today demonstrates atrial pacing with intrinsic conduction to left bundle branch block with a PR interval of 0.20/0.10/0.47  Assessment and  Plan

## 2011-05-06 NOTE — Patient Instructions (Signed)
Your physician has recommended you make the following change in your medication:  1) Stop digoxin.  Remote monitoring is used to monitor your Pacemaker of ICD from home. This monitoring reduces the number of office visits required to check your device to one time per year. It allows [place] to keep an eye on the functioning of your device to ensure it is working properly. You are scheduled for a device check from home on 08/11/11. You may send your transmission at any time that day. If you have a wireless device, the transmission will be sent automatically. After your physician reviews your transmission, you will receive a postcard with your next transmission date.  Your physician wants you to follow-up in: 6 months. You will receive a reminder letter in the mail two months in advance. If you don't receive a letter, please call our office to schedule the follow-up appointment.

## 2011-05-06 NOTE — Assessment & Plan Note (Signed)
contniue current meds.  With EF 40-45% will d/c digoxin based on recent mortality data

## 2011-05-06 NOTE — Assessment & Plan Note (Signed)
With min sx will not further pursue the MR assessment by TEE

## 2011-05-06 NOTE — Assessment & Plan Note (Signed)
Intercurrent VT with ATP  CL 400 msec

## 2011-06-03 ENCOUNTER — Ambulatory Visit (INDEPENDENT_AMBULATORY_CARE_PROVIDER_SITE_OTHER): Payer: Medicare Other | Admitting: *Deleted

## 2011-06-03 DIAGNOSIS — Z7901 Long term (current) use of anticoagulants: Secondary | ICD-10-CM

## 2011-06-03 DIAGNOSIS — I4891 Unspecified atrial fibrillation: Secondary | ICD-10-CM

## 2011-06-23 ENCOUNTER — Encounter: Payer: Medicare Other | Admitting: *Deleted

## 2011-06-30 ENCOUNTER — Other Ambulatory Visit (INDEPENDENT_AMBULATORY_CARE_PROVIDER_SITE_OTHER): Payer: Medicare Other

## 2011-06-30 ENCOUNTER — Ambulatory Visit (INDEPENDENT_AMBULATORY_CARE_PROVIDER_SITE_OTHER): Payer: Medicare Other | Admitting: Internal Medicine

## 2011-06-30 ENCOUNTER — Encounter: Payer: Self-pay | Admitting: Internal Medicine

## 2011-06-30 ENCOUNTER — Ambulatory Visit (INDEPENDENT_AMBULATORY_CARE_PROVIDER_SITE_OTHER): Payer: Medicare Other | Admitting: *Deleted

## 2011-06-30 DIAGNOSIS — I4891 Unspecified atrial fibrillation: Secondary | ICD-10-CM

## 2011-06-30 DIAGNOSIS — I10 Essential (primary) hypertension: Secondary | ICD-10-CM

## 2011-06-30 DIAGNOSIS — I2589 Other forms of chronic ischemic heart disease: Secondary | ICD-10-CM

## 2011-06-30 DIAGNOSIS — I255 Ischemic cardiomyopathy: Secondary | ICD-10-CM

## 2011-06-30 DIAGNOSIS — Z95810 Presence of automatic (implantable) cardiac defibrillator: Secondary | ICD-10-CM

## 2011-06-30 DIAGNOSIS — Z23 Encounter for immunization: Secondary | ICD-10-CM

## 2011-06-30 DIAGNOSIS — Z0000 Encounter for general adult medical examination without abnormal findings: Secondary | ICD-10-CM

## 2011-06-30 DIAGNOSIS — Z7901 Long term (current) use of anticoagulants: Secondary | ICD-10-CM

## 2011-06-30 DIAGNOSIS — E785 Hyperlipidemia, unspecified: Secondary | ICD-10-CM

## 2011-06-30 LAB — COMPREHENSIVE METABOLIC PANEL
AST: 21 U/L (ref 0–37)
Alkaline Phosphatase: 73 U/L (ref 39–117)
BUN: 21 mg/dL (ref 6–23)
Calcium: 9.4 mg/dL (ref 8.4–10.5)
Chloride: 100 mEq/L (ref 96–112)
Creatinine, Ser: 1.1 mg/dL (ref 0.4–1.5)

## 2011-06-30 LAB — LIPID PANEL
Cholesterol: 119 mg/dL (ref 0–200)
LDL Cholesterol: 50 mg/dL (ref 0–99)
Triglycerides: 94 mg/dL (ref 0.0–149.0)
VLDL: 18.8 mg/dL (ref 0.0–40.0)

## 2011-06-30 LAB — HEPATIC FUNCTION PANEL
Alkaline Phosphatase: 73 U/L (ref 39–117)
Bilirubin, Direct: 0.1 mg/dL (ref 0.0–0.3)
Total Protein: 7 g/dL (ref 6.0–8.3)

## 2011-06-30 NOTE — Assessment & Plan Note (Signed)
Stable per Dr. Graciela Husbands

## 2011-06-30 NOTE — Assessment & Plan Note (Addendum)
Lab Results  Component Value Date   CHOL 121 02/10/2010   HDL 32.20* 02/10/2010   LDLCALC 60 02/10/2010   TRIG 145.0 02/10/2010   CHOLHDL 4 02/10/2010   Lab Results  Component Value Date   CHOL 119 06/30/2011   HDL 50.30 06/30/2011   LDLCALC 50 06/30/2011   TRIG 94.0 06/30/2011   CHOLHDL 2 06/30/2011    For repeat lipid panel with recommendations to follow- Addendum - continued good control.

## 2011-06-30 NOTE — Assessment & Plan Note (Signed)
Interval medical history is stable. Physical exam is normal. Labs pending. No record of colonoscopy - is a candidate for exam. Immunizations - current except for shingles - he will check on coverage.  IN summary - a very nice man, still very active and working, who appears medically stable. He is encouraged to develop an exercise program. He will return as needed or in 18-24 months.

## 2011-06-30 NOTE — Assessment & Plan Note (Signed)
BP Readings from Last 3 Encounters:  06/30/11 110/66  05/06/11 111/78  09/21/10 120/70   Good control. Continue present medications.

## 2011-06-30 NOTE — Assessment & Plan Note (Signed)
Stable with regular rate on exam today.   Plan - continue present medications.

## 2011-06-30 NOTE — Progress Notes (Signed)
Subjective:    Patient ID: [name], male    DOB: 13-Jun-1933, 78 y.o.   MRN: [id number]  HPI The patient is here for annual Medicare wellness examination and management of other chronic and acute problems. In the interval since his last exam, Dec '11, he has been doing well. He is followed closely by Dr. Graciela Husbands after ICD implantation. No shocks recently and he has no cardiac complaints.    The risk factors are reflected in the social history.  The roster of all physicians providing medical care to patient - is listed in the Snapshot section of the chart.  Activities of daily living:  The patient is 100% inedpendent in all ADLs: dressing, toileting, feeding as well as independent mobility  Home safety : The patient has smoke detectors in the home. Falls - had one fall tripping over the dog. House is fall-safe. No grab bars in bathroom. They wear seatbelts. There are firearms in the home - kept unloaded. Has not been to firing range. There is no violence in the home.   There is no risks for hepatitis, STDs or HIV. There is no   history of blood transfusion. They have no travel history to infectious disease endemic areas of the world.  The patient has seen their dentist in the last six month. They have seen their eye doctor in the last year. They admit to any hearing difficulty, wearing hearing aids. Has not had audiologic testing in the last year.  They do not  have excessive sun exposure. Discussed the need for sun protection: hats, long sleeves and use of sunscreen if there is significant sun exposure.   Diet: the importance of a healthy diet is discussed. They do have a healthy diet.  The patient has no regular exercise program.  The benefits of regular aerobic exercise were discussed.  Depression screen: there are no signs or vegative symptoms of depression- irritability, change in appetite, anhedonia, sadness/tearfullness.  Cognitive assessment: the patient manages all their  financial and personal affairs and is actively engaged. They could relate day,date,year and events; recalled 3/3 objects at 3 minutes; performed clock-face test normally.  The following portions of the patient's history were reviewed and updated as appropriate: allergies, current medications, past family history, past medical history,  past surgical history, past social history  and problem list.  Vision, hearing, body mass index were assessed and reviewed.   During the course of the visit the patient was educated and counseled about appropriate screening and preventive services including : fall prevention , diabetes screening, nutrition counseling, colorectal cancer screening, and recommended immunizations.  Past Medical History  Diagnosis Date  . DVT (deep venous thrombosis)   . Ischemic cardiomyopathy     CABG vx4 1986 and PTCA in 1991; stent 1997 cath 2008  . Dyslipidemia   . HTN (hypertension)   . Atrial fibrillation     On Tikosyn  . Ventricular tachycardia     With prior shock therapy and antitachycardia pacing  . Automatic implantable cardiac defibrillator in situ 3/08    St. Jude  . History of colonoscopy 10/14/2002   Past Surgical History  Procedure Date  . Cardiac defibrillator placement 3/08    St. Jude  . Ptca 1997  . Coronary artery bypass graft 1996    last cath reports an atretic LIMA to the LAD and patent SVG to RCA   Family History  Problem Relation Age of Onset  . Diabetes    . Heart disease  Father   . Ulcers Father   . Hyperlipidemia Father   . Hypertension Father   . Cancer Brother     lung cancer  . Cancer Brother     throat cancer   History   Social History  . Marital Status: Married    Spouse Name: N/A    Number of Children: 4  . Years of Education: N/A   Occupational History  . Employed in Airline pilot in the [employer]    Social History Main Topics  . Smoking status: Current Some Day Smoker    Types: Cigars  . Smokeless tobacco: Never  Used   Comment: quit smoking cigerettes in 1996  . Alcohol Use: 10.5 oz/week    21 drink(s) per week     3 glasses of wine daily  . Drug Use: No  . Sexually Active: Not Currently   Other Topics Concern  . Not on file   Social History Narrative   HSG, College in '75 x 2 years. Married '57 - 37yr/divorced; married '69- 7 yrs/divorced; married '79 - 24yrs/divorced; '96 -. 3 boys - '58, '59, '60; 1 girl - '61; 9 grandchildren, 4 great-grands. 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 ACP - yes CPR, no long term mechanical ventilation; no heroic measures in the face of irreversible disease or disability.       Review of Systems Constitutional:  Negative for fever, chills, activity change and unexpected weight change.  HEENT:  Negative for hearing loss, ear pain, congestion, neck stiffness and postnasal drip. Negative for sore throat or swallowing problems. Negative for dental complaints.   Eyes: Negative for vision loss or change in visual acuity.  Respiratory: Negative for chest tightness and wheezing. Negative for DOE.   Cardiovascular: Negative for chest pain or palpitations. No decreased exercise tolerance Gastrointestinal: No change in bowel habit. No bloating or gas. No reflux or indigestion Genitourinary: Negative for urgency, frequency, flank pain and difficulty urinating.  Musculoskeletal: Negative for myalgias, back pain, arthralgias and gait problem.  Neurological: Negative for dizziness, tremors, weakness and headaches.  Hematological: Negative for adenopathy.  Psychiatric/Behavioral: Negative for behavioral problems and dysphoric mood.   Lab Results  Component Value Date   WBC 11.4* 04/23/2010   HGB 13.0 04/23/2010   HCT 38.1* 04/23/2010   PLT 158.0 04/23/2010   GLUCOSE 99 06/30/2011   CHOL 119 06/30/2011   TRIG 94.0 06/30/2011   HDL 50.30 06/30/2011   LDLCALC 50 06/30/2011        ALT 20 06/30/2011   AST 21 06/30/2011        NA 136 06/30/2011   K 4.9 06/30/2011   CL 100  06/30/2011   CREATININE 1.1 06/30/2011   BUN 21 06/30/2011   CO2 30 06/30/2011   TSH 1.13 09/21/2010   INR 2.0 06/30/2011       Mg                          2.2                                                                   06/30/2011      Objective:   Physical Exam Filed Vitals:   06/30/11  1343  BP: 110/66  Pulse: 60  Temp: 99.4 F (37.4 C)  Resp: 14  Weight: 200 lb (90.719 kg)   Gen'l: Well nourished well developed white male in no acute distress  HEENT: Head: Normocephalic and atraumatic. Right Ear: External ear normal. EAC/TM nl. Left Ear: External ear normal.  EAC/TM nl. Nose: Nose normal. Mouth/Throat: Oropharynx is clear and moist. Dentition - native, in good repair. No buccal or palatal lesions. Posterior pharynx clear. Eyes: Conjunctivae and sclera clear. Pterygium medial aspect left eye on iris but not on lens/pupil. EOM intact. Pupils are equal, round, and reactive to light. Right eye exhibits no discharge. Left eye exhibits no discharge. Neck: Normal range of motion. Neck supple. No JVD present. No tracheal deviation present. No thyromegaly present.  Cardiovascular: Normal rate, regular rhythm, no gallop, no friction rub, no murmur heard.      Quiet precordium. 2+ radial and DP pulses . No carotid bruits Pulmonary/Chest: Effort normal. No respiratory distress or increased WOB, no wheezes, no rales. No chest wall deformity or CVAT. Implanted device left anterior chest wall. Abdominal: Soft. Bowel sounds are normal in all quadrants. He exhibits no distension, no tenderness, no rebound or guarding, No heptosplenomegaly  Genitourinary:  deferred to age Musculoskeletal: Normal range of motion. He exhibits no edema and no tenderness.       Small and large joints without redness, synovial thickening or deformity. Full range of motion preserved about all small, median and large joints.  Lymphadenopathy:    He has no cervical or supraclavicular adenopathy.  Neurological: He is alert and  oriented to person, place, and time. CN II-XII intact. DTRs 2+ and symmetrical biceps, radial and patellar tendons. Cerebellar function normal with no tremor, rigidity, normal gait and station.  Skin: Skin is warm and dry. No rash noted. No erythema.  Psychiatric: He has a normal mood and affect. His behavior is normal. Thought content normal.         Assessment & Plan:

## 2011-07-03 ENCOUNTER — Encounter: Payer: Self-pay | Admitting: Internal Medicine

## 2011-07-04 ENCOUNTER — Telehealth: Payer: Self-pay | Admitting: Internal Medicine

## 2011-07-04 MED ORDER — ROSUVASTATIN CALCIUM 40 MG PO TABS: 40.0000 mg | ORAL_TABLET | Freq: Every day | ORAL | Status: DC

## 2011-07-04 NOTE — Telephone Encounter (Signed)
The pt is hoping to get a refill of Crestor 40mg  sent through [facility].  Thanks!

## 2011-07-04 NOTE — Telephone Encounter (Signed)
Done

## 2011-07-18 ENCOUNTER — Other Ambulatory Visit: Payer: Self-pay | Admitting: *Deleted

## 2011-07-18 MED ORDER — METOPROLOL TARTRATE 100 MG PO TABS: ORAL_TABLET | ORAL | Status: DC

## 2011-07-18 MED ORDER — RAMIPRIL 5 MG PO CAPS: ORAL_CAPSULE | ORAL | Status: DC

## 2011-07-18 MED ORDER — DILTIAZEM HCL ER 120 MG PO CP24: ORAL_CAPSULE | ORAL | Status: DC

## 2011-07-27 ENCOUNTER — Telehealth: Payer: Self-pay | Admitting: *Deleted

## 2011-07-27 NOTE — Telephone Encounter (Signed)
Pt left vm req Rf. I called to see what meds ne is needing. He states he has already given the info to someone else and they are handling it.

## 2011-07-28 ENCOUNTER — Ambulatory Visit (INDEPENDENT_AMBULATORY_CARE_PROVIDER_SITE_OTHER): Payer: Medicare Other | Admitting: *Deleted

## 2011-07-28 DIAGNOSIS — Z7901 Long term (current) use of anticoagulants: Secondary | ICD-10-CM

## 2011-07-28 DIAGNOSIS — I4891 Unspecified atrial fibrillation: Secondary | ICD-10-CM

## 2011-07-28 LAB — POCT INR: INR: 2.3

## 2011-08-01 ENCOUNTER — Other Ambulatory Visit: Payer: Self-pay | Admitting: Internal Medicine

## 2011-08-01 MED ORDER — METOPROLOL TARTRATE 100 MG PO TABS: ORAL_TABLET | ORAL | Status: DC

## 2011-08-01 MED ORDER — DILTIAZEM HCL ER 120 MG PO CP24: ORAL_CAPSULE | ORAL | Status: DC

## 2011-08-01 NOTE — Telephone Encounter (Signed)
Refill   Patient verified preferred pharm, [facility].  Patient can be reached at hm# [phone] for any additional info

## 2011-08-11 ENCOUNTER — Encounter: Payer: Self-pay | Admitting: Internal Medicine

## 2011-08-11 ENCOUNTER — Ambulatory Visit (INDEPENDENT_AMBULATORY_CARE_PROVIDER_SITE_OTHER): Payer: Medicare Other | Admitting: *Deleted

## 2011-08-11 DIAGNOSIS — I472 Ventricular tachycardia: Secondary | ICD-10-CM

## 2011-08-11 LAB — REMOTE ICD DEVICE
BATTERY VOLTAGE: 2.57 V
DEVICE MODEL ICD: 430236
FVT: 1
HV IMPEDENCE: 38 Ohm
MODE SWITCH EPISODES: 761
RV LEAD IMPEDENCE ICD: 330 Ohm
TZAT-0012SLOWVT: 250 ms
TZAT-0013SLOWVT: 10
TZAT-0018SLOWVT: NEGATIVE
TZAT-0020SLOWVT: 1 ms
TZON-0003SLOWVT: 430 ms
TZON-0005SLOWVT: 6
TZST-0001SLOWVT: 2
TZST-0001SLOWVT: 4
TZST-0001SLOWVT: 5
TZST-0003SLOWVT: 25 J

## 2011-08-23 NOTE — Progress Notes (Signed)
ICD remote 

## 2011-08-26 ENCOUNTER — Encounter: Payer: Self-pay | Admitting: *Deleted

## 2011-08-30 ENCOUNTER — Telehealth: Payer: Self-pay | Admitting: Internal Medicine

## 2011-08-30 NOTE — Telephone Encounter (Signed)
New Problem:     Patient called in needing a refill of his TIKOSYN 500 MCG capsule.

## 2011-08-31 MED ORDER — DOFETILIDE 500 MCG PO CAPS: 500.0000 ug | ORAL_CAPSULE | Freq: Two times a day (BID) | ORAL | Status: DC

## 2011-09-08 ENCOUNTER — Ambulatory Visit (INDEPENDENT_AMBULATORY_CARE_PROVIDER_SITE_OTHER): Payer: Medicare Other | Admitting: Pharmacist

## 2011-09-08 DIAGNOSIS — Z7901 Long term (current) use of anticoagulants: Secondary | ICD-10-CM

## 2011-09-08 DIAGNOSIS — I4891 Unspecified atrial fibrillation: Secondary | ICD-10-CM

## 2011-09-08 LAB — POCT INR: INR: 4.2

## 2011-09-29 ENCOUNTER — Ambulatory Visit (INDEPENDENT_AMBULATORY_CARE_PROVIDER_SITE_OTHER): Payer: Medicare Other

## 2011-09-29 DIAGNOSIS — I4891 Unspecified atrial fibrillation: Secondary | ICD-10-CM

## 2011-09-29 DIAGNOSIS — Z7901 Long term (current) use of anticoagulants: Secondary | ICD-10-CM

## 2011-10-14 ENCOUNTER — Other Ambulatory Visit: Payer: Self-pay

## 2011-10-14 MED ORDER — WARFARIN SODIUM 4 MG PO TABS: 4.0000 mg | ORAL_TABLET | Freq: Every day | ORAL | Status: DC

## 2011-10-18 ENCOUNTER — Encounter: Payer: Self-pay | Admitting: *Deleted

## 2011-10-18 DIAGNOSIS — Z95810 Presence of automatic (implantable) cardiac defibrillator: Secondary | ICD-10-CM | POA: Insufficient documentation

## 2011-10-24 ENCOUNTER — Encounter: Payer: Self-pay | Admitting: *Deleted

## 2011-10-25 ENCOUNTER — Ambulatory Visit (INDEPENDENT_AMBULATORY_CARE_PROVIDER_SITE_OTHER): Payer: Medicare Other | Admitting: Internal Medicine

## 2011-10-25 ENCOUNTER — Encounter: Payer: Self-pay | Admitting: Internal Medicine

## 2011-10-25 ENCOUNTER — Ambulatory Visit (INDEPENDENT_AMBULATORY_CARE_PROVIDER_SITE_OTHER): Payer: Medicare Other | Admitting: Pharmacist

## 2011-10-25 VITALS — BP 128/72 | HR 60 | Ht 70.0 in | Wt 205.8 lb

## 2011-10-25 DIAGNOSIS — I4891 Unspecified atrial fibrillation: Secondary | ICD-10-CM

## 2011-10-25 DIAGNOSIS — Z7901 Long term (current) use of anticoagulants: Secondary | ICD-10-CM

## 2011-10-25 DIAGNOSIS — Z95810 Presence of automatic (implantable) cardiac defibrillator: Secondary | ICD-10-CM

## 2011-10-25 DIAGNOSIS — I472 Ventricular tachycardia: Secondary | ICD-10-CM

## 2011-10-25 DIAGNOSIS — I255 Ischemic cardiomyopathy: Secondary | ICD-10-CM

## 2011-10-25 DIAGNOSIS — I2589 Other forms of chronic ischemic heart disease: Secondary | ICD-10-CM

## 2011-10-25 LAB — ICD DEVICE OBSERVATION
AL IMPEDENCE ICD: 430 Ohm
AL THRESHOLD: 0.5 V
BAMS-0003: 60 {beats}/min
DEV-0020ICD: NEGATIVE
RV LEAD AMPLITUDE: 9.4 mv
TZAT-0001SLOWVT: 1
TZAT-0012SLOWVT: 250 ms
TZAT-0020SLOWVT: 1 ms
TZON-0005SLOWVT: 6
TZON-0010SLOWVT: 80 ms
TZST-0001SLOWVT: 2
TZST-0001SLOWVT: 4
TZST-0003SLOWVT: 15 J
TZST-0003SLOWVT: 36 J
VENTRICULAR PACING ICD: 1.3 pct

## 2011-10-25 LAB — POCT INR: INR: 2.6

## 2011-10-25 NOTE — Assessment & Plan Note (Signed)
On dofetilide. Atrial fibrillation is about 5% and largely asymptomatic. Also on warfarin.

## 2011-10-25 NOTE — Assessment & Plan Note (Signed)
Stable.  Continue current medications.

## 2011-10-25 NOTE — Patient Instructions (Addendum)
Remote monitoring is used to monitor your Pacemaker of ICD from home. This monitoring reduces the number of office visits required to check your device to one time per year. It allows [place] to keep an eye on the functioning of your device to ensure it is working properly. You are scheduled for a device check from home on February 02, 2012. You may send your transmission at any time that day. If you have a wireless device, the transmission will be sent automatically. After your physician reviews your transmission, you will receive a postcard with your next transmission date.  Your physician wants you to follow-up in: 6 months with Dr Graciela Husbands.  You will receive a reminder letter in the mail two months in advance. If you don't receive a letter, please call our office to schedule the follow-up appointment.  Your physician recommends that you continue on your current medications as directed. Please refer to the Current Medication list given to you today.

## 2011-10-25 NOTE — Assessment & Plan Note (Signed)
No ventricular tachycardia since his last merlin transmission in March. Review of the transmission at that time demonstrated monomorphic VT terminated by antitachycardia pacing with 7 episodes from his prior interrogation.

## 2011-10-25 NOTE — Progress Notes (Signed)
  HPI  [name] is a 78 y.o. male  seen in followup for ventriucular tachycardia and atrial fibrillation s/p ICD implantation with both appropriate and inappropriate therapy. He has a history of ischemic heart disease with prior CABG and moderate depression of LV systolic function.  Tikosyn was started About a year ago. He continues to have less atrial fibrillation about 25% at time but very few palpitations. There has been no ICD discharges interogation of his ICD showed last ATP VT in Feb      He has had no chest pain or significant shortness of breath.  There's been no peripheral edema palpitation;  Past Medical History  Diagnosis Date  . DVT (deep venous thrombosis)   . Ischemic cardiomyopathy     CABG vx4 1986 and PTCA in 1991; stent 1997 cath 2008  . Dyslipidemia   . HTN (hypertension)   . Atrial fibrillation     On Tikosyn  . Ventricular tachycardia     With prior shock therapy and antitachycardia pacing  . Automatic implantable cardiac defibrillator in situ 3/08    St. Jude  . History of colonoscopy 10/14/2002    Past Surgical History  Procedure Date  . Cardiac defibrillator placement 3/08    St. Jude  . Ptca 1997  . Coronary artery bypass graft 1996    last cath reports an atretic LIMA to the LAD and patent SVG to RCA    Current Outpatient Prescriptions  Medication Sig Dispense Refill  . diltiazem (DILT-XR) 120 MG 24 hr capsule TAKE 1 CAPSULE DAILY  90 capsule  2  . dofetilide (TIKOSYN) 500 MCG capsule Take 1 capsule (500 mcg total) by mouth 2 (two) times daily.  180 capsule  2  . fish oil-omega-3 fatty acids 1000 MG capsule Take 1 g by mouth 2 (two) times daily.        . metoprolol (LOPRESSOR) 100 MG tablet TAKE 1 TABLET TWICE A DAY  180 tablet  2  . Multiple Vitamin (MULTIVITAMIN) tablet Take 1 tablet by mouth daily.        . nitroGLYCERIN (NITROSTAT) 0.4 MG SL tablet Place 0.4 mg under the tongue every 5 (five) minutes as needed. For chest pain.       .  ramipril (ALTACE) 5 MG capsule TAKE 1 CAPSULE DAILY  90 capsule  2  . rosuvastatin (CRESTOR) 40 MG tablet Take 1 tablet (40 mg total) by mouth daily. STOP SIMVASTATIN  90 tablet  3  . warfarin (COUMADIN) 4 MG tablet Take 1 tablet (4 mg total) by mouth daily. As directed by Anticoagulation clinic  90 tablet  2    No Known Allergies  Review of Systems negative except from HPI and PMH  Physical Exam BP 128/72  Pulse 60  Ht 5\' 10"  (1.778 m)  Wt 205 lb 12 oz (93.328 kg)  BMI 29.52 kg/m2 Well developed and well nourished in no acute distress HENT normal E scleral and icterus clear Neck Supple JVP flat; carotids brisk and full Clear to ausculation Regular rate and rhythm, no murmurs gallops or rub Soft with active bowel sounds No clubbing cyanosis none Edema Alert and oriented, grossly normal motor and sensory function Skin Warm and Dry  ECG demonstrates atrial pacing at 60 Intervals 0/17/48 Left bundle branch block  Assessment and  Plan

## 2011-10-25 NOTE — Assessment & Plan Note (Signed)
The patient's device was interrogated.  The information was reviewed. No changes were made in the programming.    

## 2011-11-22 ENCOUNTER — Ambulatory Visit (INDEPENDENT_AMBULATORY_CARE_PROVIDER_SITE_OTHER): Payer: Medicare Other | Admitting: *Deleted

## 2011-11-22 DIAGNOSIS — I4891 Unspecified atrial fibrillation: Secondary | ICD-10-CM

## 2011-11-22 DIAGNOSIS — Z7901 Long term (current) use of anticoagulants: Secondary | ICD-10-CM

## 2011-12-20 ENCOUNTER — Ambulatory Visit (INDEPENDENT_AMBULATORY_CARE_PROVIDER_SITE_OTHER): Payer: Medicare Other | Admitting: Pharmacist

## 2011-12-20 DIAGNOSIS — Z7901 Long term (current) use of anticoagulants: Secondary | ICD-10-CM

## 2011-12-20 DIAGNOSIS — I4891 Unspecified atrial fibrillation: Secondary | ICD-10-CM

## 2011-12-20 LAB — POCT INR: INR: 2.6

## 2012-01-09 ENCOUNTER — Telehealth: Payer: Self-pay | Admitting: Internal Medicine

## 2012-01-09 NOTE — Telephone Encounter (Signed)
Pt instructed to send merlin transmission and Gunnar Fusi to show Dr Graciela Husbands episode.

## 2012-01-09 NOTE — Telephone Encounter (Signed)
New msg Pt said his defib went off yesterday. He wanted to talk to you

## 2012-01-11 ENCOUNTER — Ambulatory Visit (INDEPENDENT_AMBULATORY_CARE_PROVIDER_SITE_OTHER)
Admission: RE | Admit: 2012-01-11 | Discharge: 2012-01-11 | Disposition: A | Discharge status: Home / Self Care | Payer: Medicare Other | Source: Ambulatory Visit | Attending: Internal Medicine | Admitting: Internal Medicine

## 2012-01-11 ENCOUNTER — Ambulatory Visit (INDEPENDENT_AMBULATORY_CARE_PROVIDER_SITE_OTHER): Payer: Medicare Other | Admitting: Internal Medicine

## 2012-01-11 ENCOUNTER — Encounter: Payer: Self-pay | Admitting: Internal Medicine

## 2012-01-11 ENCOUNTER — Encounter: Payer: Self-pay | Admitting: *Deleted

## 2012-01-11 VITALS — BP 114/70 | HR 88 | Ht 70.0 in | Wt 202.8 lb

## 2012-01-11 DIAGNOSIS — I472 Ventricular tachycardia: Secondary | ICD-10-CM

## 2012-01-11 DIAGNOSIS — I4891 Unspecified atrial fibrillation: Secondary | ICD-10-CM

## 2012-01-11 DIAGNOSIS — I509 Heart failure, unspecified: Secondary | ICD-10-CM

## 2012-01-11 DIAGNOSIS — I5042 Chronic combined systolic (congestive) and diastolic (congestive) heart failure: Secondary | ICD-10-CM

## 2012-01-11 DIAGNOSIS — R55 Syncope and collapse: Secondary | ICD-10-CM

## 2012-01-11 DIAGNOSIS — I2589 Other forms of chronic ischemic heart disease: Secondary | ICD-10-CM

## 2012-01-11 LAB — CBC WITH DIFFERENTIAL/PLATELET
Basophils Relative: 0.6 % (ref 0.0–3.0)
Eosinophils Absolute: 0.2 10*3/uL (ref 0.0–0.7)
Eosinophils Relative: 2.6 % (ref 0.0–5.0)
Hemoglobin: 12 g/dL — ABNORMAL LOW (ref 13.0–17.0)
Lymphocytes Relative: 13.3 % (ref 12.0–46.0)
MCHC: 33.3 g/dL (ref 30.0–36.0)
Neutro Abs: 6.2 10*3/uL (ref 1.4–7.7)
RBC: 3.85 Mil/uL — ABNORMAL LOW (ref 4.22–5.81)
WBC: 8.1 10*3/uL (ref 4.5–10.5)

## 2012-01-11 LAB — ICD DEVICE OBSERVATION
DEV-0020ICD: NEGATIVE
DEVICE MODEL ICD: 430236
TZAT-0001SLOWVT: 1
TZAT-0019SLOWVT: 7.5 V
TZAT-0020SLOWVT: 1 ms
TZON-0004SLOWVT: 50
TZON-0005SLOWVT: 6
TZON-0010SLOWVT: 80 ms
TZST-0001SLOWVT: 4
TZST-0003SLOWVT: 15 J
TZST-0003SLOWVT: 36 J

## 2012-01-11 LAB — BASIC METABOLIC PANEL
CO2: 28 mEq/L (ref 19–32)
Glucose, Bld: 88 mg/dL (ref 70–99)
Potassium: 4.9 mEq/L (ref 3.5–5.1)
Sodium: 138 mEq/L (ref 135–145)

## 2012-01-11 NOTE — Assessment & Plan Note (Signed)
The patient has profound exercise intolerance. He has some apnea while he sleeps. He does not obstructive patterns. His cardiomyopathy and Cheyne-Stokes respirations and perhaps we will need to pursue this issue further.  He works in spurts because of exercise associated fatigue. He is a very broad left bundle and I think perhaps cardiopulmonary stress testing might be of value to elucidate the mechanism of his fatigue and if he has profound VO2 max limits upgrade to a CRT may be worth considering.  In addition, for fatigue would undertake a diltiazem withdrawal trial to see if it is contributing.

## 2012-01-11 NOTE — Assessment & Plan Note (Signed)
As above We will undertake catheterization

## 2012-01-11 NOTE — Assessment & Plan Note (Addendum)
The patient has had an episode of polymorphic ventricular tachycardia. There are potential contributors including Tikosyn and ischemia which need to be pursued. We'll check his potassium and magnesium level. It has been about 5 years since he had a catheterization so we will repeat catheterization not withstanding his negative Myoview a couple years ago.  Further with his syncope he is advised not to drive for 6 months

## 2012-01-11 NOTE — Assessment & Plan Note (Signed)
With head trauma on warfarin we'll need to undertake a CAT scan without contrast

## 2012-01-11 NOTE — Assessment & Plan Note (Signed)
The patient's device was interrogated.  The information was reviewed. No changes were made in the programming.    

## 2012-01-11 NOTE — Progress Notes (Signed)
HPI  [name] is a 78 y.o. male seen in followup for ventricular tachycardia and atrial fibrillation s/p ICD implantation with both appropriate and inappropriate therapy. He has a history of ischemic heart disease with prior CABG and moderate depression of LV systolic function.   Tikosyn was started About 2 years ago. He continues to have   atrial fibrillation  little about the present time.   He is seen today because of an episode of syncopal ventricular tachycardia which upon interrogation of polymorphic. He had no warning. He has not had any significant changes in his functional status; he denies chest pain. He has chronic shortness of breath and fatigue. He has some edema. H    Echocardiogram may 2012 demonstrated left ventricular dysfunction with an EF 40-45% with mild 4 chamber enlargement and no significant regurgitation Myoview scan thyroid 2011 demonstrated an ejection fraction of 49% a large inferior perfusion defect  Catheterization 2008 demonstrating occlusion of his RCA a patent vein graft. The LIMA was atretic with the LAD 40% stenosis  Past Medical History  Diagnosis Date  . DVT (deep venous thrombosis)   . Ischemic cardiomyopathy     CABG vx4 1986 and PTCA in 1991; stent 1997 cath 2008  . Dyslipidemia   . HTN (hypertension)   . Atrial fibrillation     On Tikosyn  . Ventricular tachycardia     With prior shock therapy and antitachycardia pacing  . Automatic implantable cardiac defibrillator in situ 3/08    St. Jude  . History of colonoscopy 10/14/2002    Past Surgical History  Procedure Date  . Cardiac defibrillator placement 3/08    St. Jude  . Ptca 1997  . Coronary artery bypass graft 1996    last cath reports an atretic LIMA to the LAD and patent SVG to RCA    Current Outpatient Prescriptions  Medication Sig Dispense Refill  . clobetasol cream (TEMOVATE) 0.05 % Apply 1 application topically daily.       . digoxin (LANOXIN) 0.25 MG tablet Take 0.25  mg by mouth daily.      Marland Kitchen diltiazem (DILT-XR) 120 MG 24 hr capsule TAKE 1 CAPSULE DAILY  90 capsule  2  . dofetilide (TIKOSYN) 500 MCG capsule Take 1 capsule (500 mcg total) by mouth 2 (two) times daily.  180 capsule  2  . fish oil-omega-3 fatty acids 1000 MG capsule Take 1 g by mouth 2 (two) times daily.        . metoprolol (LOPRESSOR) 100 MG tablet TAKE 1 TABLET TWICE A DAY  180 tablet  2  . Multiple Vitamin (MULTIVITAMIN) tablet Take 1 tablet by mouth daily.        . nitroGLYCERIN (NITROSTAT) 0.4 MG SL tablet Place 0.4 mg under the tongue every 5 (five) minutes as needed. For chest pain.       . ramipril (ALTACE) 5 MG capsule TAKE 1 CAPSULE DAILY  90 capsule  2  . rosuvastatin (CRESTOR) 40 MG tablet Take 1 tablet (40 mg total) by mouth daily. STOP SIMVASTATIN  90 tablet  3  . warfarin (COUMADIN) 4 MG tablet Take 1 tablet (4 mg total) by mouth daily. As directed by Anticoagulation clinic  90 tablet  2    No Known Allergies  Review of Systems negative except from HPI and PMH  Physical Exam BP 114/70  Pulse 88  Ht 5\' 10"  (1.778 m)  Wt 202 lb 12.8 oz (91.989 kg)  BMI 29.10 kg/m2 Well developed and  well nourished in no acute distress HENT -bruise on his occiput; hard of hearing E scleral and icterus clear Neck Supple JVP flat; carotids brisk and full Clear to ausculation Regular rate and rhythm, no murmurs gallops or rub Soft with active bowel sounds No clubbing cyanosis 1+ Edema Alert and oriented, grossly normal motor and sensory function Skin Warm and Dry  12 lead  ECG  Interrogation of his device demonstrates an episode of polymorphic ventricular tachycardia the initiation of which is not available on interrogation. There is some irregularity at the beginning suggesting long short sequence  Assessment and  Plan

## 2012-01-11 NOTE — Assessment & Plan Note (Signed)
No significant intercurrent atrial fibrillation continue warfarin and dofetilide

## 2012-01-11 NOTE — Patient Instructions (Addendum)
Your physician has recommended you make the following change in your medication:  1) Hold your Diltiazem for 4 weeks  Non-Cardiac CT scanning, (CAT scanning), is a noninvasive, special x-ray that produces cross-sectional images of the body using x-rays and a computer. CT scans help physicians diagnose and treat medical conditions. For some CT exams, a contrast material is used to enhance visibility in the area of the body being studied. CT scans provide greater clarity and reveal more details than regular x-ray exams.  Your physician has requested that you have a cardiac catheterization. Cardiac catheterization is used to diagnose and/or treat various heart conditions. Doctors may recommend this procedure for a number of different reasons. The most common reason is to evaluate chest pain. Chest pain can be a symptom of coronary artery disease (CAD), and cardiac catheterization can show whether plaque is narrowing or blocking your heart's arteries. This procedure is also used to evaluate the valves, as well as measure the blood flow and oxygen levels in different parts of your heart. For further information please visit [email or web address] Please follow instruction sheet, as given.

## 2012-01-12 LAB — DIGOXIN LEVEL: Digoxin Level: <0.1 ng/mL — ABNORMAL LOW (ref 0.8–2.0)

## 2012-01-13 ENCOUNTER — Other Ambulatory Visit: Payer: Self-pay | Admitting: Internal Medicine

## 2012-01-13 DIAGNOSIS — I472 Ventricular tachycardia: Secondary | ICD-10-CM

## 2012-01-16 ENCOUNTER — Inpatient Hospital Stay (HOSPITAL_BASED_OUTPATIENT_CLINIC_OR_DEPARTMENT_OTHER)
Admission: RE | Admit: 2012-01-16 | Discharge: 2012-01-16 | Disposition: A | Discharge status: Home / Self Care | Payer: Medicare Other | Source: Ambulatory Visit | Attending: Cardiology | Admitting: Cardiology

## 2012-01-16 ENCOUNTER — Encounter (HOSPITAL_BASED_OUTPATIENT_CLINIC_OR_DEPARTMENT_OTHER): Payer: Self-pay | Admitting: Cardiology

## 2012-01-16 ENCOUNTER — Encounter (HOSPITAL_BASED_OUTPATIENT_CLINIC_OR_DEPARTMENT_OTHER)
Admission: RE | Disposition: A | Discharge status: Home / Self Care | Payer: Self-pay | Source: Ambulatory Visit | Attending: Cardiology

## 2012-01-16 DIAGNOSIS — I25810 Atherosclerosis of coronary artery bypass graft(s) without angina pectoris: Secondary | ICD-10-CM | POA: Insufficient documentation

## 2012-01-16 DIAGNOSIS — I2510 Atherosclerotic heart disease of native coronary artery without angina pectoris: Secondary | ICD-10-CM | POA: Insufficient documentation

## 2012-01-16 DIAGNOSIS — I4891 Unspecified atrial fibrillation: Secondary | ICD-10-CM | POA: Insufficient documentation

## 2012-01-16 DIAGNOSIS — I4729 Other ventricular tachycardia: Secondary | ICD-10-CM | POA: Insufficient documentation

## 2012-01-16 DIAGNOSIS — I472 Ventricular tachycardia, unspecified: Secondary | ICD-10-CM | POA: Insufficient documentation

## 2012-01-16 DIAGNOSIS — Z86718 Personal history of other venous thrombosis and embolism: Secondary | ICD-10-CM | POA: Insufficient documentation

## 2012-01-16 DIAGNOSIS — I10 Essential (primary) hypertension: Secondary | ICD-10-CM | POA: Insufficient documentation

## 2012-01-16 DIAGNOSIS — I2589 Other forms of chronic ischemic heart disease: Secondary | ICD-10-CM | POA: Insufficient documentation

## 2012-01-16 DIAGNOSIS — Z95810 Presence of automatic (implantable) cardiac defibrillator: Secondary | ICD-10-CM | POA: Insufficient documentation

## 2012-01-16 SURGERY — JV LEFT HEART CATHETERIZATION WITH CORONARY ANGIOGRAM
Anesthesia: Moderate Sedation

## 2012-01-16 MED ORDER — SODIUM CHLORIDE 0.9 % IV SOLN: 250.0000 mL | INTRAVENOUS | Status: DC | PRN

## 2012-01-16 MED ORDER — SODIUM CHLORIDE 0.9 % IV SOLN
INTRAVENOUS | Status: DC
  Administered 2012-01-16: 08:00:00 via INTRAVENOUS

## 2012-01-16 MED ORDER — ONDANSETRON HCL 4 MG/2ML IJ SOLN: 4.0000 mg | Freq: Four times a day (QID) | INTRAMUSCULAR | Status: DC | PRN

## 2012-01-16 MED ORDER — SODIUM CHLORIDE 0.9 % IJ SOLN: 3.0000 mL | Freq: Two times a day (BID) | INTRAMUSCULAR | Status: DC

## 2012-01-16 MED ORDER — ACETAMINOPHEN 325 MG PO TABS: 650.0000 mg | ORAL_TABLET | ORAL | Status: DC | PRN

## 2012-01-16 MED ORDER — SODIUM CHLORIDE 0.9 % IJ SOLN: 3.0000 mL | INTRAMUSCULAR | Status: DC | PRN

## 2012-01-16 MED ORDER — SODIUM CHLORIDE 0.9 % IV SOLN: INTRAVENOUS | Status: AC

## 2012-01-16 NOTE — Interval H&P Note (Signed)
History and Physical Interval Note:  01/16/2012 8:46 AM  [name]  has presented today for surgery, with the diagnosis of CP  The various methods of treatment have been discussed with the patient. After consideration of risks, benefits and other options for treatment, the patient has consented to  Procedure(s) (LRB): JV LEFT HEART CATHETERIZATION WITH CORONARY ANGIOGRAM (N/A) as a surgical intervention .  The patient's history has been reviewed, patient examined, no change in status, stable for surgery.  I have reviewed the patient's chart and labs.  Questions were answered to the patient's satisfaction.  I spoke with him about history and risks.  I reviewed his data in detail, including old films done in 2008.  Case set up by Dr. Berton Mount.    Shawnie Pons

## 2012-01-16 NOTE — CV Procedure (Signed)
   Cardiac Catheterization Procedure Note  Name: [name] MRN: [id number] DOB: [birth year]  Procedure: Left Heart Cath, Selective Coronary Angiography,  SVG and LIMA angiography,  LV angiography  Indication: Known CAD and CABG with known disease, and recent ICD discharge for VT   Procedural details: The right groin was prepped, draped, and anesthetized with 1% lidocaine. Using modified Seldinger technique, a 4 French sheath was introduced into the right femoral artery. Standard Judkins catheters were used for coronary angiography, SVG, and LIMA,  and left ventriculography. Catheter exchanges were performed over a guidewire. There were no immediate procedural complications. The patient was transferred to the post catheterization recovery area for further monitoring.  Procedural Findings: Hemodynamics:  AO 121/59 (81) LV 121/13   Coronary angiography: Coronary dominance: right  Left mainstem: Free of significant disease.  Left anterior descending (LAD): Courses to the apex.  It has been bypassed.  There is probably 40% mid just after diag and septal.  Timi 3 flow in distal LAD.   LIMA to the LAD is patent but remains atretic.  Left circumflex (LCx): There is a tiny ramus without significant disease. There is a very large OM1 with about 50-60% smooth narrowing.  Right coronary artery (RCA): The RCA is totally occluded proximally.   The SVG to the PDA is patent.  There is focal lesion in the mid graft that is 80% and eccentric.  There is a second lesion of about 50% that is eccentric and does not appear flow limiting.  The PDA is patent and smooth.    Left ventriculography: Left ventricular systolic function is reduced.  The EF estimate is 40-45%.  The inferobasal area is akinetic.  The distal inferior segment is severely hypokinetic.    Final Conclusions:   1.  Reduced overall LVEF with inferobasal WMA 2.  Progression of mid graft SVG lesion of the SVG to the PDA. 3.  Mild  progression of disease in the large OM 4.  Atretic LIMA as in the prior study 5.  Prior ICD placement   Recommendations:  1.  The issue now is wether the ICD discharge is related to the SVG progression.  I will review with Dr. Graciela Husbands.  The patient is not having angina.  I will see back in office on Friday to discuss options.  PCI may or may not be indicated.   Shawnie Pons 01/16/2012, 9:39 AM

## 2012-01-16 NOTE — Progress Notes (Signed)
Dr. [name] Kill in to discuss results with patient's wife.  Bedrest begins @ 0945.  Tegaderm dressing applied to right groin by Army Melia.  Right groin level 0.

## 2012-01-16 NOTE — H&P (View-Only) (Signed)
HPI  [name] is a 78 y.o. male seen in followup for ventricular tachycardia and atrial fibrillation s/p ICD implantation with both appropriate and inappropriate therapy. He has a history of ischemic heart disease with prior CABG and moderate depression of LV systolic function.   Tikosyn was started About 2 years ago. He continues to have   atrial fibrillation  little about the present time.   He is seen today because of an episode of syncopal ventricular tachycardia which upon interrogation of polymorphic. He had no warning. He has not had any significant changes in his functional status; he denies chest pain. He has chronic shortness of breath and fatigue. He has some edema. H    Echocardiogram may 2012 demonstrated left ventricular dysfunction with an EF 40-45% with mild 4 chamber enlargement and no significant regurgitation Myoview scan thyroid 2011 demonstrated an ejection fraction of 49% a large inferior perfusion defect  Catheterization 2008 demonstrating occlusion of his RCA a patent vein graft. The LIMA was atretic with the LAD 40% stenosis  Past Medical History  Diagnosis Date  . DVT (deep venous thrombosis)   . Ischemic cardiomyopathy     CABG vx4 1986 and PTCA in 1991; stent 1997 cath 2008  . Dyslipidemia   . HTN (hypertension)   . Atrial fibrillation     On Tikosyn  . Ventricular tachycardia     With prior shock therapy and antitachycardia pacing  . Automatic implantable cardiac defibrillator in situ 3/08    St. Jude  . History of colonoscopy 10/14/2002    Past Surgical History  Procedure Date  . Cardiac defibrillator placement 3/08    St. Jude  . Ptca 1997  . Coronary artery bypass graft 1996    last cath reports an atretic LIMA to the LAD and patent SVG to RCA    Current Outpatient Prescriptions  Medication Sig Dispense Refill  . clobetasol cream (TEMOVATE) 0.05 % Apply 1 application topically daily.       . digoxin (LANOXIN) 0.25 MG tablet Take 0.25  mg by mouth daily.      . diltiazem (DILT-XR) 120 MG 24 hr capsule TAKE 1 CAPSULE DAILY  90 capsule  2  . dofetilide (TIKOSYN) 500 MCG capsule Take 1 capsule (500 mcg total) by mouth 2 (two) times daily.  180 capsule  2  . fish oil-omega-3 fatty acids 1000 MG capsule Take 1 g by mouth 2 (two) times daily.        . metoprolol (LOPRESSOR) 100 MG tablet TAKE 1 TABLET TWICE A DAY  180 tablet  2  . Multiple Vitamin (MULTIVITAMIN) tablet Take 1 tablet by mouth daily.        . nitroGLYCERIN (NITROSTAT) 0.4 MG SL tablet Place 0.4 mg under the tongue every 5 (five) minutes as needed. For chest pain.       . ramipril (ALTACE) 5 MG capsule TAKE 1 CAPSULE DAILY  90 capsule  2  . rosuvastatin (CRESTOR) 40 MG tablet Take 1 tablet (40 mg total) by mouth daily. STOP SIMVASTATIN  90 tablet  3  . warfarin (COUMADIN) 4 MG tablet Take 1 tablet (4 mg total) by mouth daily. As directed by Anticoagulation clinic  90 tablet  2    No Known Allergies  Review of Systems negative except from HPI and PMH  Physical Exam BP 114/70  Pulse 88  Ht 5' 10" (1.778 m)  Wt 202 lb 12.8 oz (91.989 kg)  BMI 29.10 kg/m2 Well developed and   well nourished in no acute distress HENT -bruise on his occiput; hard of hearing E scleral and icterus clear Neck Supple JVP flat; carotids brisk and full Clear to ausculation Regular rate and rhythm, no murmurs gallops or rub Soft with active bowel sounds No clubbing cyanosis 1+ Edema Alert and oriented, grossly normal motor and sensory function Skin Warm and Dry  12 lead  ECG  Interrogation of his device demonstrates an episode of polymorphic ventricular tachycardia the initiation of which is not available on interrogation. There is some irregularity at the beginning suggesting long short sequence  Assessment and  Plan  

## 2012-01-19 ENCOUNTER — Encounter: Payer: Medicare Other | Admitting: Internal Medicine

## 2012-01-20 ENCOUNTER — Encounter: Payer: Self-pay | Admitting: Cardiology

## 2012-01-20 ENCOUNTER — Ambulatory Visit (INDEPENDENT_AMBULATORY_CARE_PROVIDER_SITE_OTHER): Payer: Medicare Other | Admitting: Cardiology

## 2012-01-20 VITALS — BP 116/68 | HR 60 | Ht 70.0 in | Wt 202.0 lb

## 2012-01-20 DIAGNOSIS — I10 Essential (primary) hypertension: Secondary | ICD-10-CM

## 2012-01-20 DIAGNOSIS — I5042 Chronic combined systolic (congestive) and diastolic (congestive) heart failure: Secondary | ICD-10-CM

## 2012-01-20 DIAGNOSIS — I25810 Atherosclerosis of coronary artery bypass graft(s) without angina pectoris: Secondary | ICD-10-CM | POA: Insufficient documentation

## 2012-01-20 DIAGNOSIS — I509 Heart failure, unspecified: Secondary | ICD-10-CM

## 2012-01-20 DIAGNOSIS — I4891 Unspecified atrial fibrillation: Secondary | ICD-10-CM

## 2012-01-20 NOTE — Progress Notes (Signed)
HPI:  The patient is in for followup. From a cardiac standpoint, he is stable. He recently had an ICD discharge. He was seen by Dr. Graciela Husbands. He was referred for cardiac catheterization. The main finding, was out of the high-grade lesion in the saphenous vein graft to the distal right coronary territory. However the inferobasal segment was essentially akinetic. It almost appeared aneurysmal. Dr. Graciela Husbands I reviewed the films. He is brought back today. He is tolerating everything well. Dr. Graciela Husbands I discussed the potential options, and as discussed this with the patient in detail today. Importantly the patient is not having ischemic chest pain. The patient does continue to smoke about 2 cigarettes per day. He's not having any exertional symptoms at this point. There is a prior echocardiogram but does not specify inferior wall akinesis, but we clearly demonstrate some akinetic segments on the ventriculogram.  Current Outpatient Prescriptions  Medication Sig Dispense Refill  . clobetasol cream (TEMOVATE) 0.05 % Apply 1 application topically daily.       . digoxin (LANOXIN) 0.25 MG tablet Take 0.25 mg by mouth daily.      Marland Kitchen dofetilide (TIKOSYN) 500 MCG capsule Take 1 capsule (500 mcg total) by mouth 2 (two) times daily.  180 capsule  2  . fish oil-omega-3 fatty acids 1000 MG capsule Take 1 g by mouth 2 (two) times daily.        . metoprolol (LOPRESSOR) 100 MG tablet TAKE 1 TABLET TWICE A DAY  180 tablet  2  . Multiple Vitamin (MULTIVITAMIN) tablet Take 1 tablet by mouth daily.        . nitroGLYCERIN (NITROSTAT) 0.4 MG SL tablet Place 0.4 mg under the tongue every 5 (five) minutes as needed. For chest pain.       . ramipril (ALTACE) 5 MG capsule TAKE 1 CAPSULE DAILY  90 capsule  2  . rosuvastatin (CRESTOR) 40 MG tablet Take 1 tablet (40 mg total) by mouth daily. STOP SIMVASTATIN  90 tablet  3  . warfarin (COUMADIN) 4 MG tablet Take 1 tablet (4 mg total) by mouth daily. As directed by Anticoagulation clinic  90  tablet  2    No Known Allergies  Past Medical History  Diagnosis Date  . DVT (deep venous thrombosis)   . Ischemic cardiomyopathy     CABG vx4 1986 and PTCA in 1991; stent 1997 cath 2008  . Dyslipidemia   . HTN (hypertension)   . Atrial fibrillation     On Tikosyn  . Ventricular tachycardia     With prior shock therapy and antitachycardia pacing  . Automatic implantable cardiac defibrillator in situ 3/08    St. Jude  . History of colonoscopy 10/14/2002    Past Surgical History  Procedure Date  . Cardiac defibrillator placement 3/08    St. Jude  . Ptca 1997  . Coronary artery bypass graft 1996    last cath reports an atretic LIMA to the LAD and patent SVG to RCA    Family History  Problem Relation Age of Onset  . Diabetes    . Heart disease Father   . Ulcers Father   . Hyperlipidemia Father   . Hypertension Father   . Cancer Brother     lung cancer  . Cancer Brother     throat cancer    History   Social History  . Marital Status: Married    Spouse Name: N/A    Number of Children: 4  . Years of Education: N/A  Occupational History  . Employed in Airline pilot in the [employer]    Social History Main Topics  . Smoking status: Current Some Day Smoker    Types: Cigars  . Smokeless tobacco: Never Used   Comment: quit smoking cigerettes in 1996  . Alcohol Use: 10.5 oz/week    21 drink(s) per week     3 glasses of wine daily  . Drug Use: No  . Sexually Active: Not Currently   Other Topics Concern  . Not on file   Social History Narrative   HSG, College in '75 x 2 years. Married '57 - 23yr/divorced; married '69- 7 yrs/divorced; married '79 - 64yrs/divorced; '96 -. 3 boys - '58, '59, '60; 1 girl - '61; 9 grandchildren, 4 great-grands. 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 ACP - yes CPR, no long term mechanical ventilation; no heroic measures in the face of irreversible disease or disability.    ROS: Please see the HPI.  All other systems  reviewed and negative.  PHYSICAL EXAM:  BP 116/68  Pulse 60  Ht 5\' 10"  (1.778 m)  Wt 202 lb (91.627 kg)  BMI 28.98 kg/m2  General: Well developed, well nourished, in no acute distress. Head:  Normocephalic and atraumatic. Neck: no JVD Lungs: Clear to auscultation and percussion. Heart: Normal S1 and S2.  No murmur, rubs or gallops.  Abdomen:  Normal bowel sounds; soft; non tender; no organomegaly Pulses: Pulses normal in all 4 extremities. Extremities: No clubbing or cyanosis. No edema.  Cath site ok.  Neurologic: Alert and oriented x 3.  EKG:  Atrial pacing.  IVCD, LBBB variety  CATH.    Left mainstem: Free of significant disease.  Left anterior descending (LAD): Courses to the apex. It has been bypassed. There is probably 40% mid just after diag and septal. Timi 3 flow in distal LAD.  LIMA to the LAD is patent but remains atretic.  Left circumflex (LCx): There is a tiny ramus without significant disease. There is a very large OM1 with about 50-60% smooth narrowing.  Right coronary artery (RCA): The RCA is totally occluded proximally.  The SVG to the PDA is patent. There is focal lesion in the mid graft that is 80% and eccentric. There is a second lesion of about 50% that is eccentric and does not appear flow limiting. The PDA is patent and smooth.  Left ventriculography: Left ventricular systolic function is reduced. The EF estimate is 40-45%. The inferobasal area is akinetic. The distal inferior segment is severely hypokinetic.  Final Conclusions:  1. Reduced overall LVEF with inferobasal WMA  2. Progression of mid graft SVG lesion of the SVG to the PDA.  3. Mild progression of disease in the large OM  4. Atretic LIMA as in the prior study  5. Prior ICD placement  Recommendations:  1. The issue now is wether the ICD discharge is related to the SVG progression. I will review with Dr. Graciela Husbands. The patient is not having angina. I will see back in office on Friday to discuss  options. PCI may or may not be indicated.  Shawnie Pons  01/16/2012, 9:39 AM      ASSESSMENT AND PLAN:

## 2012-01-20 NOTE — Assessment & Plan Note (Signed)
Hemodynamically stable 

## 2012-01-20 NOTE — Assessment & Plan Note (Signed)
Remains on medical therapy.  Has restarted warfarin.

## 2012-01-20 NOTE — Patient Instructions (Addendum)
Your physician has requested that you have a lexiscan myoview. For further information please visit [email or web address] Please follow instruction sheet, as given.  Your physician recommends that you schedule a follow-up appointment in: 3 WEEKS with Dr Graciela Husbands  Your physician recommends that you continue on your current medications as directed. Please refer to the Current Medication list given to you today.

## 2012-01-20 NOTE — Assessment & Plan Note (Signed)
The patient had a discharge. He's not having typical angina. 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 Dr. Graciela Husbands I talked about him in detail. We thought the best approach would be to do a nuclear imaging study to see if there is ischemia and viable myocardium involving the inferior segment. He's not candidate for MRI. We will get the results and determine what the future should be in terms of percutaneous intervention. I will have him see Dr. Graciela Husbands back.

## 2012-01-25 ENCOUNTER — Encounter: Payer: Self-pay | Admitting: Pharmacist

## 2012-02-01 ENCOUNTER — Ambulatory Visit (INDEPENDENT_AMBULATORY_CARE_PROVIDER_SITE_OTHER): Payer: Medicare Other | Admitting: *Deleted

## 2012-02-01 ENCOUNTER — Ambulatory Visit (HOSPITAL_COMMUNITY): Payer: Medicare Other | Attending: Cardiology | Admitting: Radiology

## 2012-02-01 VITALS — BP 122/76 | HR 76 | Ht 70.0 in | Wt 200.0 lb

## 2012-02-01 DIAGNOSIS — R55 Syncope and collapse: Secondary | ICD-10-CM

## 2012-02-01 DIAGNOSIS — I2510 Atherosclerotic heart disease of native coronary artery without angina pectoris: Secondary | ICD-10-CM

## 2012-02-01 DIAGNOSIS — F17200 Nicotine dependence, unspecified, uncomplicated: Secondary | ICD-10-CM | POA: Insufficient documentation

## 2012-02-01 DIAGNOSIS — I4891 Unspecified atrial fibrillation: Secondary | ICD-10-CM

## 2012-02-01 DIAGNOSIS — I447 Left bundle-branch block, unspecified: Secondary | ICD-10-CM | POA: Insufficient documentation

## 2012-02-01 DIAGNOSIS — R000 Tachycardia, unspecified: Secondary | ICD-10-CM | POA: Insufficient documentation

## 2012-02-01 DIAGNOSIS — Z7901 Long term (current) use of anticoagulants: Secondary | ICD-10-CM

## 2012-02-01 DIAGNOSIS — I10 Essential (primary) hypertension: Secondary | ICD-10-CM | POA: Insufficient documentation

## 2012-02-01 LAB — POCT INR: INR: 2.1

## 2012-02-01 MED ORDER — TECHNETIUM TC 99M SESTAMIBI GENERIC - CARDIOLITE
30.0000 | Freq: Once | INTRAVENOUS | Status: AC | PRN
  Administered 2012-02-01: 30 via INTRAVENOUS

## 2012-02-01 MED ORDER — TECHNETIUM TC 99M SESTAMIBI GENERIC - CARDIOLITE
10.0000 | Freq: Once | INTRAVENOUS | Status: AC | PRN
  Administered 2012-02-01: 10 via INTRAVENOUS

## 2012-02-01 MED ORDER — ADENOSINE (DIAGNOSTIC) 3 MG/ML IV SOLN
0.5600 mg/kg | Freq: Once | INTRAVENOUS | Status: AC
  Administered 2012-02-01: 50.7 mg via INTRAVENOUS

## 2012-02-01 NOTE — Progress Notes (Signed)
[facility] SITE 3 NUCLEAR MED [address] [place] [address] [phone]  Cardiology Nuclear Med Study  [name] is a 78 y.o. male     MRN : [id number]     DOB: 1933/08/22  Procedure Date: 02/01/2012  Nuclear Med Background Indication for Stress Test:  Evaluation for Ischemia, PTCA/Stent and Graft Patency History:  '86 MI>CABG; '91 PTCA; '97 Stent; '11 OZH:YQMVHQ fixed inferior defect with minimal ischemia, EF=49%; '12 Echo:EF=45%; 01/09/12 Recent D/C of ICD; 01/16/12 Cath:Mild progression of CAD, EF=45%  Cardiac Risk Factors: Family History - CAD, Hypertension, LBBB, Lipids and Smoker  Symptoms:  Rapid HR and Syncope on 01/09/12 with Discharge of ICD   Nuclear Pre-Procedure Caffeine/Decaff Intake:  None NPO After: 7:00pm   Lungs:  Clear. O2 Sat: 98% on room air. IV 0.9% NS with Angio Cath:  20g  IV Site: R Antecubital  IV Started by:  Bonnita Levan, RN  Chest Size (in):  44 Cup Size: n/a  Height: 5\' 10"  (1.778 m)  Weight:  200 lb (90.719 kg)  BMI:  Body mass index is 28.70 kg/(m^2). Tech Comments:  Medications taken as directed.    Nuclear Med Study 1 or 2 day study: 1 day  Stress Test Type:  Adenosine  Reading MD: Willa Rough, MD  Order Authorizing Provider:  Shawnie Pons, MD  Resting Radionuclide: Technetium 69m Sestamibi  Resting Radionuclide Dose: 11.0 mCi   Stress Radionuclide:  Technetium 28m Sestamibi  Stress Radionuclide Dose: 33.0 mCi           Stress Protocol Rest HR: 60 Stress HR: 63  Rest BP: 122/76 Stress BP: 119/61  Exercise Time (min): n/a METS: n/a   Predicted Max HR: 144 bpm % Max HR: 43.75 bpm Rate Pressure Product: 7497   Dose of Adenosine (mg):  50.9 Dose of Lexiscan: n/a mg  Dose of Atropine (mg): n/a Dose of Dobutamine: n/a mcg/kg/min (at max HR)  Stress Test Technologist: Smiley Houseman, CMA-N  Nuclear Technologist:  Domenic Polite, CNMT     Rest Procedure:  Myocardial perfusion imaging was performed at rest 45  minutes following the intravenous administration of Technetium 67m Sestamibi.  Rest ECG: LBBB with intermittent atrial pacing.  Stress Procedure:  The patient received IV adenosine at 140 mcg/kg/min for 4 minutes.  There were no significant changes with infusion.  She did c/o chest tightness with infusion.  Technetium 51m Sestamibi was injected at the 2 minute mark and quantitative spect images were obtained after a 45 minute delay.  Stress ECG: No significant change from baseline ECG and No significant ST segment change suggestive of ischemia.  QPS Raw Data Images:  Patient motion noted; appropriate software correction applied. Stress Images:  There is a moderate area of decreased uptake affecting the base, mid,  and apical aspects of the inferior wall. The degree of photon reduction is severe. Rest Images:  The images are the same as the stress images. Subtraction (SDS):  No evidence of ischemia. Transient Ischemic Dilatation (Normal <1.22):  1.07 Lung/Heart Ratio (Normal <0.45):  0.27  Quantitative Gated Spect Images QGS EDV:  161 ml QGS ESV:  84 ml  Impression Exercise Capacity:  Adenosine study with no exercise. BP Response:  Normal blood pressure response. Clinical Symptoms:  chest tightness ECG Impression:  No significant ST segment change suggestive of ischemia. Comparison with Prior Nuclear Study: No images to compare  Overall Impression:  The study reveals a dense scar affecting the entire inferior wall. There  is no other scar. There is no significant ischemia.  LV Ejection Fraction: 48%.  LV Wall Motion:  There is akinesis of the inferior wall. There is significant dyskinesis of the septum related to the bundle-branch block and the prior history of CABG.  Willa Rough, MD

## 2012-02-02 ENCOUNTER — Encounter: Payer: Medicare Other | Admitting: *Deleted

## 2012-02-07 ENCOUNTER — Encounter: Payer: Self-pay | Admitting: Internal Medicine

## 2012-02-07 ENCOUNTER — Ambulatory Visit (INDEPENDENT_AMBULATORY_CARE_PROVIDER_SITE_OTHER): Payer: Medicare Other | Admitting: Internal Medicine

## 2012-02-07 VITALS — BP 114/66 | HR 61 | Ht 70.0 in | Wt 205.0 lb

## 2012-02-07 DIAGNOSIS — R55 Syncope and collapse: Secondary | ICD-10-CM

## 2012-02-07 DIAGNOSIS — I2589 Other forms of chronic ischemic heart disease: Secondary | ICD-10-CM

## 2012-02-07 DIAGNOSIS — I4729 Other ventricular tachycardia: Secondary | ICD-10-CM

## 2012-02-07 DIAGNOSIS — I4891 Unspecified atrial fibrillation: Secondary | ICD-10-CM

## 2012-02-07 DIAGNOSIS — Z95810 Presence of automatic (implantable) cardiac defibrillator: Secondary | ICD-10-CM

## 2012-02-07 DIAGNOSIS — I472 Ventricular tachycardia, unspecified: Secondary | ICD-10-CM

## 2012-02-07 DIAGNOSIS — I255 Ischemic cardiomyopathy: Secondary | ICD-10-CM

## 2012-02-07 LAB — ICD DEVICE OBSERVATION
AL IMPEDENCE ICD: 430 Ohm
AL THRESHOLD: 0.75 V
ATRIAL PACING ICD: 74 pct
BAMS-0003: 60 {beats}/min
DEV-0020ICD: NEGATIVE
HV IMPEDENCE: 37 Ohm
RV LEAD IMPEDENCE ICD: 330 Ohm
RV LEAD THRESHOLD: 1 V
TZAT-0012SLOWVT: 250 ms
TZAT-0013SLOWVT: 10
TZAT-0018SLOWVT: NEGATIVE
TZON-0003SLOWVT: 430 ms
TZST-0001SLOWVT: 3
TZST-0001SLOWVT: 5
TZST-0003SLOWVT: 25 J
VENTRICULAR PACING ICD: 3.1 pct

## 2012-02-07 NOTE — Assessment & Plan Note (Signed)
Continue current medications. 

## 2012-02-07 NOTE — Progress Notes (Signed)
Patient Care Team: Jacques Navy, MD as PCP - General   HPI  [name] is a 78 y.o. male  seen in followup for ventricular tachycardia and atrial fibrillation s/p ICD implantation with both appropriate and inappropriate therapy. He has a history of ischemic heart disease with prior CABG and moderate depression of LV systolic function.  Tikosyn was started About 2 years ago. He continues to have atrial fibrillation little about the present time.  He is seen today because of an episode of syncopal ventricular tachycardia which upon interrogation of polymorphic. He had no warning. He has not had any significant changes in his functional status; he denies chest pain. He has chronic shortness of breath and fatigue. He has some edema.  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  There have been no palpitations, lightheadedness or syncope.      Past Medical History  Diagnosis Date  . DVT (deep venous thrombosis)   . Ischemic cardiomyopathy     CABG vx4 1986 and PTCA in 1991; stent 1997 cath 2008  . Dyslipidemia   . HTN (hypertension)   . Atrial fibrillation     On Tikosyn  . Ventricular tachycardia     With prior shock therapy and antitachycardia pacing  . Automatic implantable cardiac defibrillator in situ 3/08    St. Jude  . History of colonoscopy 10/14/2002    Past Surgical History  Procedure Date  . Cardiac defibrillator placement 3/08    St. Jude  . Ptca 1997  . Coronary artery bypass graft 1996    last cath reports an atretic LIMA to the LAD and patent SVG to RCA    Current Outpatient Prescriptions  Medication Sig Dispense Refill  . clobetasol cream (TEMOVATE) 0.05 % Apply 1  application topically daily.       . digoxin (LANOXIN) 0.25 MG tablet Take 0.25 mg by mouth daily.      Marland Kitchen dofetilide (TIKOSYN) 500 MCG capsule Take 1 capsule (500 mcg total) by mouth 2 (two) times daily.  180 capsule  2  . fish oil-omega-3 fatty acids 1000 MG capsule Take 1 g by mouth 2 (two) times daily.        . metoprolol (LOPRESSOR) 100 MG tablet TAKE 1 TABLET TWICE A DAY  180 tablet  2  . Multiple Vitamin (MULTIVITAMIN) tablet Take 1 tablet by mouth daily.        . nitroGLYCERIN (NITROSTAT) 0.4 MG SL tablet Place 0.4 mg under the tongue every 5 (five) minutes as needed. For chest pain.       . ramipril (ALTACE) 5 MG capsule TAKE 1 CAPSULE DAILY  90 capsule  2  . rosuvastatin (CRESTOR) 40 MG tablet Take 1 tablet (40 mg total) by mouth daily. STOP SIMVASTATIN  90 tablet  3  . warfarin (COUMADIN) 4 MG tablet Take 1 tablet (4 mg total) by mouth daily. As directed by Anticoagulation clinic  90 tablet  2    No Known Allergies  Review of Systems negative except from HPI and PMH  Physical Exam BP 114/66  Pulse 61  Ht 5\' 10"  (1.778 m)  Wt 205 lb (92.987 kg)  BMI 29.41 kg/m2  SpO2 98% Well developed and well nourished in  no acute distress HENT normal E scleral and icterus clear Neck Supple JVP flat; carotids brisk and full Clear to ausculation Regular rate and rhythm, 2/6 murmur A Soft with active bowel sounds No clubbing cyanosis Trace Edema Alert and oriented, grossly normal motor and sensory function Skin Warm and Dry    Assessment and  Plan

## 2012-02-07 NOTE — Assessment & Plan Note (Signed)
Advised that he is not to drive for 5 more months

## 2012-02-07 NOTE — Assessment & Plan Note (Signed)
No intercurrent ventricular tachycardia. No ischemic trigger for polymorphic ventricular tachycardia was identified. We will continue him on dofetilide therapy as well as beta blockers.

## 2012-02-07 NOTE — Patient Instructions (Addendum)
Remote monitoring is used to monitor your Pacemaker of ICD from home. This monitoring reduces the number of office visits required to check your device to one time per year. It allows [place] to keep an eye on the functioning of your device to ensure it is working properly. You are scheduled for a device check from home on May 14, 2012. You may send your transmission at any time that day. If you have a wireless device, the transmission will be sent automatically. After your physician reviews your transmission, you will receive a postcard with your next transmission date.  Your physician wants you to follow-up in: 1 year with Dr Graciela Husbands.  You will receive a reminder letter in the mail two months in advance. If you don't receive a letter, please call our office to schedule the follow-up appointment.

## 2012-02-07 NOTE — Assessment & Plan Note (Signed)
Infrequent episodes 

## 2012-02-07 NOTE — Assessment & Plan Note (Signed)
The patient's device was interrogated.  The information was reviewed. No changes were made in the programming.   1

## 2012-02-14 ENCOUNTER — Encounter: Payer: Self-pay | Admitting: Internal Medicine

## 2012-03-08 ENCOUNTER — Encounter: Payer: Self-pay | Admitting: Internal Medicine

## 2012-03-14 ENCOUNTER — Ambulatory Visit (INDEPENDENT_AMBULATORY_CARE_PROVIDER_SITE_OTHER): Payer: Medicare Other | Admitting: *Deleted

## 2012-03-14 DIAGNOSIS — Z7901 Long term (current) use of anticoagulants: Secondary | ICD-10-CM

## 2012-03-14 DIAGNOSIS — I4891 Unspecified atrial fibrillation: Secondary | ICD-10-CM

## 2012-03-14 LAB — POCT INR: INR: 3.3

## 2012-03-29 ENCOUNTER — Encounter: Payer: Self-pay | Admitting: Internal Medicine

## 2012-03-29 ENCOUNTER — Ambulatory Visit (INDEPENDENT_AMBULATORY_CARE_PROVIDER_SITE_OTHER): Payer: Medicare Other | Admitting: Internal Medicine

## 2012-03-29 VITALS — BP 132/86 | HR 60 | Ht 70.0 in | Wt 207.4 lb

## 2012-03-29 DIAGNOSIS — I4891 Unspecified atrial fibrillation: Secondary | ICD-10-CM

## 2012-03-29 DIAGNOSIS — R5383 Other fatigue: Secondary | ICD-10-CM

## 2012-03-29 DIAGNOSIS — I255 Ischemic cardiomyopathy: Secondary | ICD-10-CM

## 2012-03-29 DIAGNOSIS — R5381 Other malaise: Secondary | ICD-10-CM

## 2012-03-29 DIAGNOSIS — I2589 Other forms of chronic ischemic heart disease: Secondary | ICD-10-CM

## 2012-03-29 DIAGNOSIS — Z95810 Presence of automatic (implantable) cardiac defibrillator: Secondary | ICD-10-CM

## 2012-03-29 HISTORY — DX: Other fatigue: R53.83

## 2012-03-29 LAB — ICD DEVICE OBSERVATION
AL AMPLITUDE: 1.9 mv
AL IMPEDENCE ICD: 450 Ohm
BAMS-0001: 150 {beats}/min
BAMS-0003: 60 {beats}/min
BATTERY VOLTAGE: 2.57 V
RV LEAD IMPEDENCE ICD: 360 Ohm
RV LEAD THRESHOLD: 0.75 V
TZAT-0001SLOWVT: 1
TZAT-0004SLOWVT: 12
TZAT-0012SLOWVT: 250 ms
TZAT-0018SLOWVT: NEGATIVE
TZON-0003SLOWVT: 430 ms
TZON-0010SLOWVT: 80 ms
TZST-0001SLOWVT: 3
TZST-0001SLOWVT: 5
TZST-0003SLOWVT: 15 J
TZST-0003SLOWVT: 36 J

## 2012-03-29 MED ORDER — ATENOLOL 100 MG PO TABS: 100.0000 mg | ORAL_TABLET | Freq: Every day | ORAL | Status: DC

## 2012-03-29 NOTE — Progress Notes (Signed)
Patient Care Team: Jacques Navy, MD as PCP - General   HPI  [name] is a 78 y.o. male seen in followup for ventricular tachycardia and atrial fibrillation s/p ICD implantation with both appropriate and inappropriate therapy. He has a history of ischemic heart disease with prior CABG and moderate depression of LV systolic function. Tikosyn was started about 2 years ago. He continues to have atrial fibrillation     He is seen today because of an episode of syncopal ventricular tachycardia which upon interrogation was polymorphic. He had no warning. He has not had any significant changes in his functional status; he denies chest pain. He has chronic shortness of breath and fatigue. He has some edema.  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 There have been no palpitations, lightheadedness or syncope.  He does however have significant fatigue but his wife thinks is about 58 months old. We have tried diltiazem elimination trial which had no benefit.    Past Medical History  Diagnosis Date  . DVT (deep venous thrombosis)   . Ischemic cardiomyopathy     CABG vx4 1986 and PTCA in 1991; stent 1997 cath 2008  . Dyslipidemia   . HTN (hypertension)   . Atrial fibrillation     On Tikosyn  . Ventricular tachycardia     With prior shock therapy and antitachycardia pacing  . Automatic implantable cardiac defibrillator in situ 3/08    St. Jude  . History of colonoscopy 10/14/2002    Past Surgical History  Procedure Date  . Cardiac defibrillator placement 3/08    St. Jude  . Ptca 1997  .  Coronary artery bypass graft 1996    last cath reports an atretic LIMA to the LAD and patent SVG to RCA    Current Outpatient Prescriptions  Medication Sig Dispense Refill  . clobetasol cream (TEMOVATE) 0.05 % Apply 1 application topically daily.       . digoxin (LANOXIN) 0.25 MG tablet Take 0.25 mg by mouth daily.      Marland Kitchen dofetilide (TIKOSYN) 500 MCG capsule Take 1 capsule (500 mcg total) by mouth 2 (two) times daily.  180 capsule  2  . fish oil-omega-3 fatty acids 1000 MG capsule Take 1 g by mouth 2 (two) times daily.        . metoprolol (LOPRESSOR) 100 MG tablet TAKE 1 TABLET TWICE A DAY  180 tablet  2  . Multiple Vitamin (MULTIVITAMIN) tablet Take 1 tablet by mouth daily.        . nitroGLYCERIN (NITROSTAT) 0.4 MG SL tablet Place 0.4 mg under the tongue every 5 (five) minutes as needed. For chest pain.       . ramipril (ALTACE) 5 MG capsule TAKE 1 CAPSULE DAILY  90 capsule  2  . rosuvastatin (CRESTOR) 40 MG tablet Take 1 tablet (40 mg total) by mouth daily. STOP SIMVASTATIN  90 tablet  3  . warfarin (COUMADIN) 4 MG tablet Take 1 tablet (4 mg total) by mouth daily. As directed by Anticoagulation clinic  90 tablet  2    No Known Allergies  Review of Systems  negative except from HPI and PMH  Physical Exam BP 132/86  Pulse 60  Ht 5\' 10"  (1.778 m)  Wt 207 lb 6.4 oz (94.076 kg)  BMI 29.76 kg/m2  SpO2 98% Well developed and well nourished in no acute distress HENT normal E scleral and icterus clear Neck Supple JVP flat; carotids brisk and full Clear to ausculation *Regular rate and rhythm, no murmurs gallops or rub Soft with active bowel sounds No clubbing cyanosis 1+ Edema Alert and oriented, grossly normal motor and sensory function Skin Warm and Dry  Electrocardiogram didn't atrial pacing with intrinsic conduction Interval 17/17/47 Left bundle branch block  Assessment and  Plan

## 2012-03-29 NOTE — Patient Instructions (Addendum)
Your physician recommends that you schedule a follow-up appointment in: 5 weeks with Dr. Graciela Husbands.  Remote monitoring is used to monitor your Pacemaker of ICD from home. This monitoring reduces the number of office visits required to check your device to one time per year. It allows [place] to keep an eye on the functioning of your device to ensure it is working properly. You are scheduled for a device check from home on July 02, 2012. You may send your transmission at any time that day. If you have a wireless device, the transmission will be sent automatically. After your physician reviews your transmission, you will receive a postcard with your next transmission date.  Stop Metoprolol.  Start Atenolol 100mg  daily.  Your physician has recommended that you have a sleep study. This test records several body functions during sleep, including: brain activity, eye movement, oxygen and carbon dioxide blood levels, heart rate and rhythm, breathing rate and rhythm, the flow of air through your mouth and nose, snoring, body muscle movements, and chest and belly movement.

## 2012-03-29 NOTE — Assessment & Plan Note (Signed)
Recurrent afib  Will continue dogetilide  K ok in aug

## 2012-03-29 NOTE — Assessment & Plan Note (Signed)
The patient's device was interrogated.  The information was reviewed. No changes were made in the programming.    

## 2012-03-29 NOTE — Assessment & Plan Note (Signed)
Patient has recurrent ventricular tachycardia. It was terminated with ATP. We will continue him on his current beta blockers and dofetilide. I will review his old chart to see whether he was on amiodarone

## 2012-03-29 NOTE — Assessment & Plan Note (Signed)
His fatigue could be multifactorial. We'll undertake a sleep study given his significant fatigue atrial fibrillation hypertension and snoring. In addition we will undertake a beta blocker exchange trial going to atenolol for short period of time understanding the benefits for long-term prognosis is not clear that we can hopefully get at the issue as to whether metoprolol is contributing

## 2012-04-11 ENCOUNTER — Ambulatory Visit (INDEPENDENT_AMBULATORY_CARE_PROVIDER_SITE_OTHER): Payer: Medicare Other | Admitting: Pharmacist

## 2012-04-11 DIAGNOSIS — Z7901 Long term (current) use of anticoagulants: Secondary | ICD-10-CM

## 2012-04-11 DIAGNOSIS — I4891 Unspecified atrial fibrillation: Secondary | ICD-10-CM

## 2012-04-26 ENCOUNTER — Ambulatory Visit (HOSPITAL_BASED_OUTPATIENT_CLINIC_OR_DEPARTMENT_OTHER): Payer: Medicare Other | Attending: Internal Medicine

## 2012-04-26 VITALS — Ht 70.0 in | Wt 200.0 lb

## 2012-04-26 DIAGNOSIS — R5383 Other fatigue: Secondary | ICD-10-CM

## 2012-04-26 DIAGNOSIS — G4733 Obstructive sleep apnea (adult) (pediatric): Secondary | ICD-10-CM | POA: Insufficient documentation

## 2012-04-26 DIAGNOSIS — I4891 Unspecified atrial fibrillation: Secondary | ICD-10-CM

## 2012-04-26 DIAGNOSIS — I499 Cardiac arrhythmia, unspecified: Secondary | ICD-10-CM | POA: Insufficient documentation

## 2012-04-27 ENCOUNTER — Encounter: Payer: Self-pay | Admitting: *Deleted

## 2012-05-03 ENCOUNTER — Ambulatory Visit (INDEPENDENT_AMBULATORY_CARE_PROVIDER_SITE_OTHER): Payer: Medicare Other | Admitting: Internal Medicine

## 2012-05-03 ENCOUNTER — Ambulatory Visit (INDEPENDENT_AMBULATORY_CARE_PROVIDER_SITE_OTHER): Payer: Medicare Other | Admitting: *Deleted

## 2012-05-03 ENCOUNTER — Encounter: Payer: Self-pay | Admitting: Internal Medicine

## 2012-05-03 ENCOUNTER — Other Ambulatory Visit: Payer: Self-pay | Admitting: *Deleted

## 2012-05-03 VITALS — BP 128/70 | HR 66 | Resp 18 | Ht 69.0 in | Wt 208.0 lb

## 2012-05-03 DIAGNOSIS — I4891 Unspecified atrial fibrillation: Secondary | ICD-10-CM

## 2012-05-03 DIAGNOSIS — Z7901 Long term (current) use of anticoagulants: Secondary | ICD-10-CM

## 2012-05-03 DIAGNOSIS — I5042 Chronic combined systolic (congestive) and diastolic (congestive) heart failure: Secondary | ICD-10-CM

## 2012-05-03 DIAGNOSIS — R5381 Other malaise: Secondary | ICD-10-CM

## 2012-05-03 DIAGNOSIS — I509 Heart failure, unspecified: Secondary | ICD-10-CM

## 2012-05-03 DIAGNOSIS — Z4502 Encounter for adjustment and management of automatic implantable cardiac defibrillator: Secondary | ICD-10-CM

## 2012-05-03 DIAGNOSIS — I25810 Atherosclerosis of coronary artery bypass graft(s) without angina pectoris: Secondary | ICD-10-CM

## 2012-05-03 DIAGNOSIS — I472 Ventricular tachycardia: Secondary | ICD-10-CM

## 2012-05-03 DIAGNOSIS — R5383 Other fatigue: Secondary | ICD-10-CM

## 2012-05-03 LAB — ICD DEVICE OBSERVATION
AL AMPLITUDE: 2.5 mv
AL IMPEDENCE ICD: 462.5 Ohm
ATRIAL PACING ICD: 92 pct
BAMS-0001: 150 {beats}/min
DEV-0020ICD: NEGATIVE
MODE SWITCH EPISODES: 596
RV LEAD THRESHOLD: 0.75 V
TOT-0006: 20080325000000
TOT-0007: 2
TZAT-0004SLOWVT: 12
TZAT-0018SLOWVT: NEGATIVE
TZON-0003SLOWVT: 445 ms
TZON-0010SLOWVT: 80 ms
TZST-0001SLOWVT: 3
TZST-0001SLOWVT: 5
TZST-0003SLOWVT: 15 J
TZST-0003SLOWVT: 36 J
VENTRICULAR PACING ICD: 0.62 pct

## 2012-05-03 MED ORDER — ROSUVASTATIN CALCIUM 40 MG PO TABS: 40.0000 mg | ORAL_TABLET | Freq: Every day | ORAL | Status: DC

## 2012-05-03 MED ORDER — DOFETILIDE 500 MCG PO CAPS: 500.0000 ug | ORAL_CAPSULE | Freq: Two times a day (BID) | ORAL | Status: DC

## 2012-05-03 MED ORDER — RAMIPRIL 5 MG PO CAPS: ORAL_CAPSULE | ORAL | Status: DC

## 2012-05-03 MED ORDER — DIGOXIN 250 MCG PO TABS: 0.2500 mg | ORAL_TABLET | Freq: Every day | ORAL | Status: DC

## 2012-05-03 NOTE — Patient Instructions (Addendum)
Your physician wants you to follow-up in: 4 months with Dr. Graciela Husbands. You will receive a reminder letter in the mail two months in advance. If you don't receive a letter, please call our office to schedule the follow-up appointment.

## 2012-05-03 NOTE — Assessment & Plan Note (Signed)
Euvolemic we'll continue current medications 

## 2012-05-03 NOTE — Assessment & Plan Note (Signed)
Currently stable.

## 2012-05-03 NOTE — Assessment & Plan Note (Signed)
Improved on atenolol.

## 2012-05-03 NOTE — Assessment & Plan Note (Signed)
The patient has had intercurrent ventricular tachycardia treated with antitachycardia pacing which failed to terminated. It then below the detection rate. We have reprogrammed the detection rate. We have discussed the potential role of amiodarone as opposed dofetilide. In the event that he has more ventricular tachycardia or more significant atrial arrhythmias we will make the switch. We discussed side effects as the barrier switching

## 2012-05-03 NOTE — Assessment & Plan Note (Signed)
The device had evidence of recurrent ventricular tachycardia and the device was reprogrammed because of VT slowing below the detection rate

## 2012-05-03 NOTE — Progress Notes (Signed)
Patient Care Team: Jacques Navy, MD as PCP - General Duke Salvia, MD as Attending Physician (Cardiology)   HPI  [name] is a 78 y.o. male seen in followup for ventricular tachycardia and atrial fibrillation s/p ICD implantation with both appropriate and inappropriate therapy. He has a history of ischemic heart disease with prior CABG and moderate depression of LV systolic function. Tikosyn was started about 2 years ago. He continues to have atrial fibrillation  He is seen 6 weeks ago  because of an episode of syncopal ventricular tachycardia which upon interrogation was polymorphic. He had no warning.    He underwent catheterization 8/14 which demonstrated a high-grade lesion in the saphenous vein graft to his RCA; it was akinetic and the was aneurysmal he underwent Myoview scanning demonstrating no perfusion  Ejection fraction was 48% It was elected to treat this medically   Because of fatigue, he underwent sleep study and betablocker exchange trial;   he has less fatigue on  the atenolol. i was to review the chart as to whether he had previously been on amiodarone and do not find evidence that he has   He denies chest pain or shortness of breath. There is occasional edema     Past Medical History  Diagnosis Date  . DVT (deep venous thrombosis)   . Ischemic cardiomyopathy     CABG vx4 1986 and PTCA in 1991; stent 1997 cath 2008  . Dyslipidemia   . HTN (hypertension)   . Atrial fibrillation     On Tikosyn  . Ventricular tachycardia     With prior shock therapy and antitachycardia pacing  . Automatic implantable cardiac defibrillator St Judes 3/08    St. Jude  . History of colonoscopy 10/14/2002  . Coronary artery disease     Past Surgical History  Procedure Date  . Cardiac defibrillator placement 3/08    St. Jude  . Ptca 1997  . Coronary artery bypass graft 1996    last cath reports an atretic LIMA to the LAD and patent SVG to RCA    Current Outpatient  Prescriptions  Medication Sig Dispense Refill  . atenolol (TENORMIN) 100 MG tablet Take 1 tablet (100 mg total) by mouth daily.  180 tablet  3  . clobetasol cream (TEMOVATE) 0.05 % Apply 1 application topically daily.       . digoxin (LANOXIN) 0.25 MG tablet Take 0.25 mg by mouth daily.      Marland Kitchen dofetilide (TIKOSYN) 500 MCG capsule Take 1 capsule (500 mcg total) by mouth 2 (two) times daily.  180 capsule  2  . fish oil-omega-3 fatty acids 1000 MG capsule Take 1 g by mouth 2 (two) times daily.        . Multiple Vitamin (MULTIVITAMIN) tablet Take 1 tablet by mouth daily.        . nitroGLYCERIN (NITROSTAT) 0.4 MG SL tablet Place 0.4 mg under the tongue every 5 (five) minutes as needed. For chest pain.       . ramipril (ALTACE) 5 MG capsule TAKE 1 CAPSULE DAILY  90 capsule  3  . rosuvastatin (CRESTOR) 40 MG tablet Take 1 tablet (40 mg total) by mouth daily. STOP SIMVASTATIN  90 tablet  3  . warfarin (COUMADIN) 4 MG tablet Take 1 tablet (4 mg total) by mouth daily. As directed by Anticoagulation clinic  90 tablet  2  . [DISCONTINUED] ramipril (ALTACE) 5 MG capsule TAKE 1 CAPSULE DAILY  90 capsule  2  No Known Allergies  Review of Systems negative except from HPI and PMH  Physical Exam BP 128/70  Pulse 66  Resp 18  Ht 5\' 9"  (1.753 m)  Wt 208 lb (94.348 kg)  BMI 30.72 kg/m2  SpO2 93% Well developed and well nourished in no acute distress HENT normal E scleral and icterus clear Neck Supple JVP flat; carotids brisk and full Clear to ausculation  Regular rate and rhythm, no murmurs gallops or rub Soft with active bowel sounds No clubbing cyanosis none Edema Alert and oriented, grossly normal motor and sensory function Skin Warm and Dry  Electrocardiogram demonstrates an atrial paced rhythm with left bundle branch block Intervals 22/17 last 47 Assessment and  Plan

## 2012-05-04 DIAGNOSIS — G4730 Sleep apnea, unspecified: Secondary | ICD-10-CM

## 2012-05-04 DIAGNOSIS — G4710 Hypersomnia, unspecified: Secondary | ICD-10-CM

## 2012-05-05 NOTE — Procedures (Signed)
NAMEMARTEL, GALVAN NO.:  [id number]  MEDICAL RECORD NO.:  [id number]          PATIENT TYPE:  OUT  LOCATION:  SLEEP CENTER                 FACILITY:  [facility]  PHYSICIAN:  Barbaraann Share, MD,FCCPDATE OF BIRTH:  06-Sep-1933  DATE OF STUDY:  04/26/2012                           NOCTURNAL POLYSOMNOGRAM  REFERRING PHYSICIAN:  Duke Salvia, MD, [facility]  INDICATION FOR STUDY:  Hypersomnia with sleep apnea.  EPWORTH SLEEPINESS SCORE:  7.  MEDICATIONS:  SLEEP ARCHITECTURE:  The patient had a total sleep time of 267 minutes with no slow-wave sleep and 95 minutes of REM.  Sleep onset latency was mildly prolonged at 41 minutes and REM onset was mildly prolonged at 127 minutes.  Sleep efficiency was poor at 62%.  RESPIRATORY DATA:  The patient was found to have 16 obstructive apneas and 18  obstructive hypopneas, giving him an apnea-hypopnea index of 7.6 events per hour.  The events occurred primarily in the supine position, and there was loud snoring noted throughout.  The patient did not meet split night protocol secondary to his small numbers of events.  OXYGEN DATA:  There was O2 desaturation transiently as low as 84% with the patient's obstructive events.  CARDIAC DATA:  The patient was noted to have an irregular rhythm with occasional PVC.  MOVEMENT-PARASOMNIA:  Moderate numbers of periodic limb movements were seen, however, there appeared to be very little sleep disruption.  There were no abnormal behaviors seen.  IMPRESSIONS-RECOMMENDATIONS: 1. Very mild obstructive sleep apnea/hypopnea syndrome, with an AHI of     7.6 events per hour and transient oxygen desaturation as low as     84%.  Treatment for this degree of sleep apnea can include a trial     of weight loss     alone, upper airway surgery, dental appliance, and also CPAP.     Clinical correlation is suggested. 2. Irregular rhythm noted during the study as well as an  occasional      PVC.     Barbaraann Share, MD,FCCP Diplomate, American Board of Sleep Medicine    [facility]  D:  05/04/2012 08:31:37  T:  05/04/2012 08:46:06  Job:  [id number]

## 2012-05-25 ENCOUNTER — Ambulatory Visit (INDEPENDENT_AMBULATORY_CARE_PROVIDER_SITE_OTHER): Payer: Medicare Other

## 2012-05-25 DIAGNOSIS — Z7901 Long term (current) use of anticoagulants: Secondary | ICD-10-CM

## 2012-05-25 DIAGNOSIS — I4891 Unspecified atrial fibrillation: Secondary | ICD-10-CM

## 2012-05-28 ENCOUNTER — Telehealth: Payer: Self-pay | Admitting: *Deleted

## 2012-05-28 NOTE — Telephone Encounter (Signed)
Message copied by Waymon Budge on Mon May 28, 2012  3:15 PM ------      Message from: Duke Salvia      Created: Sun May 13, 2012 10:23 PM       Please Inform Patient            Thanks            ----- Message -----         From: Barbaraann Share, MD         Sent: 05/04/2012   9:00 AM           To: Duke Salvia, MD            Brett Canales, this pt's sleep study showed minimal OSA, with an AHI 7.6/hr.  I typically would not treat this aggressively unless the pt is having a lot of symptoms, or if you feel strongly it could be impacting CV health.  He could lose 30 pounds, and this would probably resolve the issue.

## 2012-05-28 NOTE — Telephone Encounter (Signed)
Provided pt with sleep study results.

## 2012-06-19 ENCOUNTER — Encounter: Payer: Self-pay | Admitting: Internal Medicine

## 2012-06-22 ENCOUNTER — Ambulatory Visit (INDEPENDENT_AMBULATORY_CARE_PROVIDER_SITE_OTHER): Payer: Medicare Other | Admitting: *Deleted

## 2012-06-22 DIAGNOSIS — I4891 Unspecified atrial fibrillation: Secondary | ICD-10-CM

## 2012-06-22 DIAGNOSIS — Z7901 Long term (current) use of anticoagulants: Secondary | ICD-10-CM

## 2012-06-29 ENCOUNTER — Ambulatory Visit (INDEPENDENT_AMBULATORY_CARE_PROVIDER_SITE_OTHER): Payer: Medicare Other | Admitting: *Deleted

## 2012-06-29 DIAGNOSIS — I4891 Unspecified atrial fibrillation: Secondary | ICD-10-CM

## 2012-06-29 DIAGNOSIS — Z7901 Long term (current) use of anticoagulants: Secondary | ICD-10-CM

## 2012-07-03 ENCOUNTER — Encounter: Payer: Self-pay | Admitting: Internal Medicine

## 2012-07-07 ENCOUNTER — Other Ambulatory Visit: Payer: Self-pay

## 2012-07-09 ENCOUNTER — Encounter: Payer: Self-pay | Admitting: Internal Medicine

## 2012-07-11 ENCOUNTER — Encounter: Payer: Self-pay | Admitting: Internal Medicine

## 2012-07-13 ENCOUNTER — Ambulatory Visit (INDEPENDENT_AMBULATORY_CARE_PROVIDER_SITE_OTHER): Payer: Medicare Other | Admitting: *Deleted

## 2012-07-16 ENCOUNTER — Telehealth: Payer: Self-pay | Admitting: *Deleted

## 2012-07-16 NOTE — Telephone Encounter (Signed)
Pt called in regarding receiving shock on 07-15-12. Pt passed out and received shock. Pt feels fine at this time. No EMS was called at time. Pt has no symptoms at this time. Pt was advised if receives anymore shocks to call 911 and go to ER. Pt also instructed no driving. Transmission was sent thru [facility] and will have [facility] evaluate on 2-25 and pt will be called.

## 2012-07-18 ENCOUNTER — Telehealth: Payer: Self-pay | Admitting: Internal Medicine

## 2012-07-18 NOTE — Telephone Encounter (Signed)
New problem   Test results. Is it safe for him to travel to [place].

## 2012-07-18 NOTE — Telephone Encounter (Signed)
Patient called to find out if his transmission was received?  He also wants to know if it is safe for him to travel due to his defibrillator going off?

## 2012-07-18 NOTE — Telephone Encounter (Signed)
Spoke with patient, he is now scheduled to see Dr. Graciela Husbands 07/20/12 @ 2:15pm for an increase in episodes.

## 2012-07-20 ENCOUNTER — Encounter: Payer: Self-pay | Admitting: Internal Medicine

## 2012-07-20 ENCOUNTER — Ambulatory Visit (INDEPENDENT_AMBULATORY_CARE_PROVIDER_SITE_OTHER): Payer: Medicare Other | Admitting: Internal Medicine

## 2012-07-20 VITALS — BP 112/60 | HR 60 | Ht 72.0 in | Wt 208.0 lb

## 2012-07-20 LAB — ICD DEVICE OBSERVATION
ATRIAL PACING ICD: 88 pct
DEV-0020ICD: NEGATIVE
DEVICE MODEL ICD: 430236
FVT: 0
PACEART VT: 0
RV LEAD THRESHOLD: 0.75 V
TOT-0009: 4
TOT-0010: 42
TZAT-0013SLOWVT: 10
TZAT-0018SLOWVT: NEGATIVE
TZAT-0019SLOWVT: 7.5 V
TZON-0003SLOWVT: 445 ms
TZON-0004SLOWVT: 50
TZST-0001SLOWVT: 2
TZST-0001SLOWVT: 3
TZST-0001SLOWVT: 5
TZST-0003SLOWVT: 25 J
VENTRICULAR PACING ICD: 1.5 pct

## 2012-07-20 MED ORDER — AMIODARONE HCL 400 MG PO TABS: ORAL_TABLET | ORAL | Status: DC

## 2012-07-20 MED ORDER — AMIODARONE HCL 200 MG PO TABS: 200.0000 mg | ORAL_TABLET | Freq: Every day | ORAL | Status: DC

## 2012-07-20 MED ORDER — DIGOXIN 125 MCG PO TABS: 0.1250 mg | ORAL_TABLET | Freq: Every day | ORAL | Status: DC

## 2012-07-20 NOTE — Assessment & Plan Note (Signed)
Euvolemic. As we introduced amiodarone we will decrease his warfarin dose as well as decrease his digoxin dose

## 2012-07-20 NOTE — Assessment & Plan Note (Signed)
Patient has recurrent ventricular tachycardia which has been mostly monomorphic and mostly amenable to antitachycardia pacing. The syncopal episode occurred despite 10 episodes of ATP. We have decided to stop that dofetilide and begin him on amiodarone  We will need to reprogram his defibrillator detection rate probably to 120 beats per minute range. We will plan in a few weeks to bring him back for treadmill testing

## 2012-07-20 NOTE — Assessment & Plan Note (Signed)
As above Device was reprogrammed as noted

## 2012-07-20 NOTE — Patient Instructions (Addendum)
Your physician has recommended you make the following change in your medication:  1) Stop Tikosyn 2) Decrease digoxin to 0.125 mg once daily. 3) Start amiodarone 400 mg one tablet by mouth twice daily x 2 weeks, then decrease to 400 mg one tablet by mouth once daily, then decrease to 200 mg one tablet by mouth daily after that. 4) Decrease warfarin to 1/2 tablet by mouth once daily.  Your physician recommends that you schedule a follow-up appointment : next week with the coumadin clinic due to starting amiodarone.  Your physician has requested that you have an exercise tolerance test in 4 weeks with Dr. Graciela Husbands. For further information please visit [email or web address] Please also follow instruction sheet, as given.

## 2012-07-20 NOTE — Progress Notes (Signed)
Patient Care Team: Jacques Navy, MD as PCP - General Duke Salvia, MD as Attending Physician (Cardiology)   HPI  [name] is a 79 y.o. male seen in followup for ventricular tachycardia and atrial fibrillation s/p ICD implantation with both appropriate and inappropriate therapy. He has a history of ischemic heart disease with prior CABG and moderate depression of LV systolic function. Tikosyn was started about 2 years ago. He continues to have atrial fibrillation   He's been having episodes of ventricular tachycardia were which was syncopal for polymorphic.    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  He comes in today because he is continuing to have episodes of ventricular tachycardia  for [place] to discuss amiodarone    He had another episode of syncopal VT on Sunday.  His wife also says that he is scared to exercise. His problems with pain in his feet if he stands for more than an hour. She is worried about his weight. He is drinking 3+ glasses of wine a day.  Past Medical History  Diagnosis Date  . DVT (deep venous thrombosis)   . Ischemic cardiomyopathy     CABG vx4 1986 and PTCA in 1991; stent 1997 cath 2008  . Dyslipidemia   . HTN (hypertension)   . Atrial fibrillation     On Tikosyn  . Ventricular tachycardia     With prior shock therapy and antitachycardia pacing  . Automatic implantable cardiac defibrillator [employer] Judes 3/08    St. Jude  . History of colonoscopy 10/14/2002  . Coronary artery disease   . Fatigue 03/29/2012    Past Surgical History  Procedure Laterality Date  . Cardiac defibrillator placement  3/08    St. Jude  . Ptca  1997  . Coronary artery bypass graft  1996    last cath reports an atretic LIMA to the LAD and  patent SVG to RCA    Current Outpatient Prescriptions  Medication Sig Dispense Refill  . atenolol (TENORMIN) 100 MG tablet Take 1 tablet (100 mg total) by mouth daily.  180 tablet  3  . clobetasol cream (TEMOVATE) 0.05 % Apply 1 application topically daily.       . digoxin (LANOXIN) 0.25 MG tablet Take 1 tablet (0.25 mg total) by mouth daily.  90 tablet  3  . dofetilide (TIKOSYN) 500 MCG capsule Take 1 capsule (500 mcg total) by mouth 2 (two) times daily.  180 capsule  3  . fish oil-omega-3 fatty acids 1000 MG capsule Take 1 g by mouth 2 (two) times daily.        . Multiple Vitamin (MULTIVITAMIN) tablet Take 1 tablet by mouth daily.        . nitroGLYCERIN (NITROSTAT) 0.4 MG SL tablet Place 0.4 mg under the tongue every 5 (five) minutes as needed. For chest pain.       . ramipril (ALTACE) 5 MG capsule TAKE 1 CAPSULE DAILY  90 capsule  3  . rosuvastatin (CRESTOR) 40 MG tablet Take 1 tablet (40 mg total) by mouth daily. STOP SIMVASTATIN  90 tablet  3  . warfarin (COUMADIN) 4 MG tablet Take 1 tablet (4 mg total) by mouth daily. As directed by Anticoagulation clinic  90 tablet  2   No current facility-administered  medications for this visit.    No Known Allergies  Review of Systems negative except from HPI and PMH  Physical Exam There were no vitals taken for this visit. Well developed and well nourished in no acute distress HENT normal E scleral and icterus clear Neck Supple JVP flat; carotids brisk and full Clear to ausculation  Regular rate and rhythm, no murmurs gallops or rub Soft with active bowel sounds No clubbing cyanosis none Edema Alert and oriented, grossly normal motor and sensory function Skin Warm and Dry    Assessment and  Plan

## 2012-07-20 NOTE — Assessment & Plan Note (Signed)
Stable will continue current meds  

## 2012-07-20 NOTE — Assessment & Plan Note (Signed)
Recurrent syncope with prolonged PT

## 2012-08-01 ENCOUNTER — Ambulatory Visit (INDEPENDENT_AMBULATORY_CARE_PROVIDER_SITE_OTHER): Payer: Medicare Other | Admitting: Pharmacist

## 2012-08-01 LAB — POCT INR: INR: 1.6

## 2012-08-10 ENCOUNTER — Ambulatory Visit (INDEPENDENT_AMBULATORY_CARE_PROVIDER_SITE_OTHER): Payer: Medicare Other

## 2012-08-10 DIAGNOSIS — I4891 Unspecified atrial fibrillation: Secondary | ICD-10-CM

## 2012-08-10 DIAGNOSIS — Z7901 Long term (current) use of anticoagulants: Secondary | ICD-10-CM

## 2012-08-15 ENCOUNTER — Telehealth: Payer: Self-pay | Admitting: Internal Medicine

## 2012-08-15 ENCOUNTER — Encounter: Payer: Medicare Other | Admitting: Internal Medicine

## 2012-08-15 ENCOUNTER — Other Ambulatory Visit: Payer: Self-pay | Admitting: *Deleted

## 2012-08-15 DIAGNOSIS — I5042 Chronic combined systolic (congestive) and diastolic (congestive) heart failure: Secondary | ICD-10-CM

## 2012-08-15 DIAGNOSIS — I4891 Unspecified atrial fibrillation: Secondary | ICD-10-CM

## 2012-08-15 DIAGNOSIS — Z4502 Encounter for adjustment and management of automatic implantable cardiac defibrillator: Secondary | ICD-10-CM

## 2012-08-15 DIAGNOSIS — R55 Syncope and collapse: Secondary | ICD-10-CM

## 2012-08-15 DIAGNOSIS — I472 Ventricular tachycardia: Secondary | ICD-10-CM

## 2012-08-15 DIAGNOSIS — I255 Ischemic cardiomyopathy: Secondary | ICD-10-CM

## 2012-08-15 MED ORDER — AMIODARONE HCL 200 MG PO TABS: 200.0000 mg | ORAL_TABLET | Freq: Every day | ORAL | Status: DC

## 2012-08-15 NOTE — Telephone Encounter (Signed)
Pt needs refill [facility] of amiodarone 200mg , pt out needs asap

## 2012-08-17 ENCOUNTER — Telehealth: Payer: Self-pay | Admitting: Internal Medicine

## 2012-08-17 NOTE — Telephone Encounter (Signed)
Pt had an exercise tolerence test on the day I had to cxl of [place], can't find a day to rs him to, can brooke or scott do it? any other suggestions?

## 2012-08-21 NOTE — Telephone Encounter (Signed)
Patient has been r/s to 4/17 with Dr. Graciela Husbands for GXT.

## 2012-08-24 ENCOUNTER — Ambulatory Visit (INDEPENDENT_AMBULATORY_CARE_PROVIDER_SITE_OTHER): Payer: Medicare Other

## 2012-08-24 DIAGNOSIS — I4891 Unspecified atrial fibrillation: Secondary | ICD-10-CM

## 2012-08-24 DIAGNOSIS — Z7901 Long term (current) use of anticoagulants: Secondary | ICD-10-CM

## 2012-09-06 ENCOUNTER — Ambulatory Visit (INDEPENDENT_AMBULATORY_CARE_PROVIDER_SITE_OTHER): Payer: Medicare Other | Admitting: Internal Medicine

## 2012-09-06 DIAGNOSIS — Z4502 Encounter for adjustment and management of automatic implantable cardiac defibrillator: Secondary | ICD-10-CM

## 2012-09-06 DIAGNOSIS — R55 Syncope and collapse: Secondary | ICD-10-CM

## 2012-09-06 DIAGNOSIS — I5042 Chronic combined systolic (congestive) and diastolic (congestive) heart failure: Secondary | ICD-10-CM

## 2012-09-06 DIAGNOSIS — I472 Ventricular tachycardia: Secondary | ICD-10-CM

## 2012-09-06 DIAGNOSIS — I509 Heart failure, unspecified: Secondary | ICD-10-CM

## 2012-09-06 DIAGNOSIS — I255 Ischemic cardiomyopathy: Secondary | ICD-10-CM

## 2012-09-06 DIAGNOSIS — I2589 Other forms of chronic ischemic heart disease: Secondary | ICD-10-CM

## 2012-09-06 DIAGNOSIS — I4891 Unspecified atrial fibrillation: Secondary | ICD-10-CM

## 2012-09-06 NOTE — Procedures (Deleted)
Exercise Treadmill Test  Pre-Exercise Testing Evaluation Rhythm: PACED  Rate: 60     Test  Exercise Tolerance Test Ordering MD: Sherryl Manges, MD  Interpreting MD: Sherryl Manges, MD  Unique Test No: 1   Treadmill:  1  Indication for ETT: AFIB  Contraindication to ETT: No   Stress Modality: exercise - treadmill  Cardiac Imaging Performed: non   Protocol: standard Bruce - maximal  Max BP:  ***/***  Max MPHR (bpm):  *** 85% MPR (bpm):  ***  MPHR obtained (bpm):  *** % MPHR obtained:  ***  Reached 85% MPHR (min:sec):  *** Total Exercise Time (min-sec):  ***  Workload in METS:  *** Borg Scale: ***  Reason ETT Terminated:  {CHL REASON TERMINATED FOR VWU:98119147}    ST Segment Analysis At Rest: {CHL ST SEGMENT AT REST FOR WGN:56213086} With Exercise: {CHL ST SEGMENT WITH EXERCISE FOR VHQ:46962952}  Other Information Arrhythmia:  {CHL ARRHYTHMIA FOR WUX:32440102} Angina during ETT:  {CHL ANGINA DURING VOZ:36644034} Quality of ETT:  {CHL QUALITY OF VQQ:59563875}  ETT Interpretation:  {CHL INTERPRETATION FOR IEP:32951884}  Comments: ***  Recommendations: ***

## 2012-09-14 ENCOUNTER — Ambulatory Visit (INDEPENDENT_AMBULATORY_CARE_PROVIDER_SITE_OTHER): Payer: Medicare Other | Admitting: *Deleted

## 2012-09-14 DIAGNOSIS — I4891 Unspecified atrial fibrillation: Secondary | ICD-10-CM

## 2012-09-14 DIAGNOSIS — Z7901 Long term (current) use of anticoagulants: Secondary | ICD-10-CM

## 2012-10-12 ENCOUNTER — Ambulatory Visit (INDEPENDENT_AMBULATORY_CARE_PROVIDER_SITE_OTHER): Payer: Medicare Other | Admitting: *Deleted

## 2012-10-12 DIAGNOSIS — I4891 Unspecified atrial fibrillation: Secondary | ICD-10-CM

## 2012-10-12 DIAGNOSIS — Z7901 Long term (current) use of anticoagulants: Secondary | ICD-10-CM

## 2012-10-16 ENCOUNTER — Ambulatory Visit (INDEPENDENT_AMBULATORY_CARE_PROVIDER_SITE_OTHER): Payer: Medicare Other | Admitting: Pharmacist

## 2012-10-16 DIAGNOSIS — Z7901 Long term (current) use of anticoagulants: Secondary | ICD-10-CM

## 2012-10-16 DIAGNOSIS — I4891 Unspecified atrial fibrillation: Secondary | ICD-10-CM

## 2012-10-26 ENCOUNTER — Ambulatory Visit (INDEPENDENT_AMBULATORY_CARE_PROVIDER_SITE_OTHER): Payer: Medicare Other | Admitting: *Deleted

## 2012-10-26 DIAGNOSIS — Z7901 Long term (current) use of anticoagulants: Secondary | ICD-10-CM

## 2012-10-26 DIAGNOSIS — I4891 Unspecified atrial fibrillation: Secondary | ICD-10-CM

## 2012-10-26 LAB — POCT INR: INR: 3.3

## 2012-11-08 ENCOUNTER — Ambulatory Visit (INDEPENDENT_AMBULATORY_CARE_PROVIDER_SITE_OTHER): Payer: Medicare Other | Admitting: *Deleted

## 2012-11-08 ENCOUNTER — Ambulatory Visit (INDEPENDENT_AMBULATORY_CARE_PROVIDER_SITE_OTHER): Payer: Medicare Other | Admitting: Internal Medicine

## 2012-11-08 ENCOUNTER — Encounter: Payer: Self-pay | Admitting: Internal Medicine

## 2012-11-08 VITALS — HR 65 | Wt 211.5 lb

## 2012-11-08 DIAGNOSIS — I472 Ventricular tachycardia: Secondary | ICD-10-CM

## 2012-11-08 DIAGNOSIS — I4891 Unspecified atrial fibrillation: Secondary | ICD-10-CM

## 2012-11-08 DIAGNOSIS — I509 Heart failure, unspecified: Secondary | ICD-10-CM

## 2012-11-08 DIAGNOSIS — I5042 Chronic combined systolic (congestive) and diastolic (congestive) heart failure: Secondary | ICD-10-CM

## 2012-11-08 DIAGNOSIS — Z95810 Presence of automatic (implantable) cardiac defibrillator: Secondary | ICD-10-CM

## 2012-11-08 DIAGNOSIS — Z7901 Long term (current) use of anticoagulants: Secondary | ICD-10-CM

## 2012-11-08 LAB — POCT INR: INR: 2.8

## 2012-11-08 NOTE — Patient Instructions (Signed)
Your physician recommends that you have lab work today: liver/tsh  Your physician recommends that you continue on your current medications as directed. Please refer to the Current Medication list given to you today.  Your physician recommends that you schedule a follow-up appointment in: 3 months with Dr. Graciela Husbands.

## 2012-11-08 NOTE — Assessment & Plan Note (Signed)
The patient's device was interrogated.  The information was reviewed. No changes were made in the programming.    

## 2012-11-08 NOTE — Progress Notes (Signed)
Patient Care Team: Jacques Navy, MD as PCP - General Duke Salvia, MD as Attending Physician (Cardiology)   HPI  [name] is a 79 y.o. male seen in followup for ventricular tachycardia and atrial fibrillation s/p ICD implantation with both appropriate and inappropriate therapy. He has a history of ischemic heart disease with prior CABG and moderate depression of LV systolic function. Tikosyn was started about 2 years ago. He continues to have atrial fibrillation  He's been having episodes of polymorphic and monomorphic ventricular tachycardia. Most recently tikosyn was discontinued and amiodarone initiated; ICD lower rate limit was decreased  He is feeling really well on the amiodarone. He denies cough sun intolerance GI symptoms or dizziness. He had no recurrent ventricular tachycardia. He is swimming for exercise.    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  Ejection fraction was 48%     Past Medical History  Diagnosis Date  . DVT (deep venous thrombosis)   . Ischemic cardiomyopathy     CABG vx4 1986 and PTCA in 1991; stent 1997 cath 2008  . Dyslipidemia   . HTN (hypertension)   . Atrial fibrillation     On Tikosyn  . Ventricular tachycardia     With prior shock therapy and antitachycardia pacing  . Automatic implantable cardiac defibrillator St Judes 3/08    St. Jude  . History of colonoscopy 10/14/2002  . Coronary artery disease   . Fatigue 03/29/2012    Past Surgical History  Procedure Laterality Date  . Cardiac defibrillator placement  3/08    St. Jude  . Ptca  1997  . Coronary artery bypass graft  1996    last cath reports an atretic LIMA to the LAD and patent SVG to RCA    Current Outpatient Prescriptions  Medication Sig Dispense Refill  .  amiodarone (PACERONE) 200 MG tablet Take 1 tablet (200 mg total) by mouth daily.  30 tablet  5  . atenolol (TENORMIN) 100 MG tablet Take 1 tablet (100 mg total) by mouth daily.  180 tablet  3  . clobetasol cream (TEMOVATE) 0.05 % Apply 1 application topically daily.       . digoxin (LANOXIN) 0.125 MG tablet Take 1 tablet (0.125 mg total) by mouth daily.  90 tablet  3  . fish oil-omega-3 fatty acids 1000 MG capsule Take 1 g by mouth 2 (two) times daily.        . Multiple Vitamin (MULTIVITAMIN) tablet Take 1 tablet by mouth daily.        . nitroGLYCERIN (NITROSTAT) 0.4 MG SL tablet Place 0.4 mg under the tongue every 5 (five) minutes as needed. For chest pain.       . ramipril (ALTACE) 5 MG capsule TAKE 1 CAPSULE DAILY  90 capsule  3  . rosuvastatin (CRESTOR) 40 MG tablet Take 1 tablet (40 mg total) by mouth daily. STOP SIMVASTATIN  90 tablet  3  . warfarin (COUMADIN) 4 MG tablet Take 1/2 tablet by mouth once daily       No current facility-administered medications for this visit.    No Known Allergies  Review of Systems negative except from HPI and PMH  Physical Exam Pulse 65  Wt 211 lb 8 oz (95.936 kg)  BMI 28.68 kg/m2 Well developed and well  nourished in no acute distress HENT normal E scleral and icterus clear Neck Supple JVP flat; carotids brisk and full Clear to ausculation  Regular rate and rhythm, no murmurs gallops or rub Soft with active bowel sounds No clubbing cyanosis none Edema Alert and oriented, grossly normal motor and sensory function Skin Warm and Dry    Assessment and  Plan

## 2012-11-08 NOTE — Assessment & Plan Note (Signed)
No recurrent atrial fibrillation.

## 2012-11-08 NOTE — Assessment & Plan Note (Signed)
No recurrent ventricular tachycardia on amiodarone. We'll check surveillance laboratories

## 2012-11-08 NOTE — Assessment & Plan Note (Signed)
euvolemic continue current meds 

## 2012-11-09 LAB — ICD DEVICE OBSERVATION
AL AMPLITUDE: 2.1 mv
ATRIAL PACING ICD: 99 pct
BAMS-0001: 150 {beats}/min
DEVICE MODEL ICD: 430236
FVT: 0
HV IMPEDENCE: 38 Ohm
PACEART VT: 0
RV LEAD AMPLITUDE: 10.7 mv
TOT-0009: 4
TZAT-0004SLOWVT: 8
TZAT-0018SLOWVT: NEGATIVE
TZON-0003SLOWVT: 475 ms
TZON-0010SLOWVT: 80 ms
TZST-0001SLOWVT: 2
TZST-0001SLOWVT: 3
TZST-0001SLOWVT: 5
TZST-0003SLOWVT: 15 J
TZST-0003SLOWVT: 36 J
VENTRICULAR PACING ICD: 12 pct
VF: 0

## 2012-11-09 LAB — HEPATIC FUNCTION PANEL
Alkaline Phosphatase: 60 U/L (ref 39–117)
Bilirubin, Direct: 0.1 mg/dL (ref 0.0–0.3)

## 2012-11-19 ENCOUNTER — Other Ambulatory Visit (INDEPENDENT_AMBULATORY_CARE_PROVIDER_SITE_OTHER): Payer: Medicare Other

## 2012-11-19 ENCOUNTER — Ambulatory Visit (INDEPENDENT_AMBULATORY_CARE_PROVIDER_SITE_OTHER): Payer: Medicare Other | Admitting: Internal Medicine

## 2012-11-19 ENCOUNTER — Encounter: Payer: Self-pay | Admitting: Internal Medicine

## 2012-11-19 VITALS — BP 110/66 | HR 68 | Temp 97.6°F | Resp 12 | Ht 70.0 in | Wt 210.8 lb

## 2012-11-19 DIAGNOSIS — I10 Essential (primary) hypertension: Secondary | ICD-10-CM

## 2012-11-19 DIAGNOSIS — I25810 Atherosclerosis of coronary artery bypass graft(s) without angina pectoris: Secondary | ICD-10-CM

## 2012-11-19 DIAGNOSIS — E785 Hyperlipidemia, unspecified: Secondary | ICD-10-CM

## 2012-11-19 DIAGNOSIS — I472 Ventricular tachycardia: Secondary | ICD-10-CM

## 2012-11-19 DIAGNOSIS — Z0000 Encounter for general adult medical examination without abnormal findings: Secondary | ICD-10-CM

## 2012-11-19 LAB — COMPREHENSIVE METABOLIC PANEL
Albumin: 4 g/dL (ref 3.5–5.2)
CO2: 32 mEq/L (ref 19–32)
Calcium: 9.3 mg/dL (ref 8.4–10.5)
GFR: 64.26 mL/min (ref >60.00)
Glucose, Bld: 111 mg/dL — ABNORMAL HIGH (ref 70–99)
Potassium: 4.6 mEq/L (ref 3.5–5.1)
Sodium: 135 mEq/L (ref 135–145)
Total Protein: 7.1 g/dL (ref 6.0–8.3)

## 2012-11-19 LAB — LIPID PANEL: Cholesterol: 131 mg/dL (ref 0–200)

## 2012-11-19 NOTE — Patient Instructions (Addendum)
Thanks for coming to see me.  All things considered you are doing well - hit that jackpot.  Labs today and results will be in the note I send and on MyChart  See you in a year or sooner as needed.

## 2012-11-19 NOTE — Progress Notes (Signed)
Subjective:    Patient ID: [name], male    DOB: [birth year], 79 y.o.   MRN: [id number]  HPI [name] is here for annual Medicare wellness examination and management of other chronic and acute problems.  He has had a couple episodes of cardiac syncope, VT, in March resolved with firing of defibrillator. He has followed up with Dr. Graciela Husbands - last visit in June: no changes in programming of ICD.  Last Cath Aug '13 - obstructive lesion RCA but decision was made to treat medically. EF - 48%   He had a rash on the distal LE - saw Dr. Margo Aye, treated with high potency topical steroid (clobetasol).  He reports a burning type of foot pain with weight bearing and he has limited ambulation. Saw Podiatry - dx'd OA foot: failed celebrex so he came to steroid injection. He does wear supportive shoes.   The risk factors are reflected in the social history.  The roster of all physicians providing medical care to patient - is listed in the Snapshot section of the chart.  Activities of daily living:  The patient is 100% inedpendent in all ADLs: dressing, toileting, feeding as well as independent mobility. He does have yard help.   Home safety : The patient has smoke detectors in the home. Falls - 1 fall in December tripped by dog; one fall due to missed step. Needs to install rail, bannisters on all steps. Did fall with sycnope due to heart. They wear seatbelts. firearms are present in the home, kept in a safe fashion. There is no violence in the home.   There is no risks for hepatitis, STDs or HIV. There is no  history of blood transfusion. They have no travel history to infectious disease endemic areas of the world.  The patient has seen their dentist in the last six month. They have seen their eye doctor in the 18 months. They admit to any hearing difficulty and has hearing aids.    They do not  have excessive sun exposure. Discussed the need for sun protection: hats, long sleeves and use of  sunscreen if there is significant sun exposure.   Diet: the importance of a healthy diet is discussed. They do have a healthy diet.  The patient has a regular exercise program: swimming ,30 min duration, 4 per week.  The benefits of regular aerobic exercise were discussed.  Depression screen: there are no signs or vegative symptoms of depression- irritability, change in appetite, anhedonia, sadness/tearfullness.  Cognitive assessment: the patient manages all their financial and personal affairs and is actively engaged.   The following portions of the patient's history were reviewed and updated as appropriate: allergies, current medications, past family history, past medical history,  past surgical history, past social history  and problem list.  Vision, hearing, body mass index were assessed and reviewed.   During the course of the visit the patient was educated and counseled about appropriate screening and preventive services including : fall prevention , diabetes screening, nutrition counseling, colorectal cancer screening, and recommended immunizations. Past Medical History  Diagnosis Date  . DVT (deep venous thrombosis)   . Ischemic cardiomyopathy     CABG vx4 1986 and PTCA in 1991; stent 1997 cath 2008  . Dyslipidemia   . HTN (hypertension)   . Atrial fibrillation     On Tikosyn  . Ventricular tachycardia     With prior shock therapy and antitachycardia pacing  . Automatic implantable cardiac defibrillator St Judes 3/08  St. Jude  . History of colonoscopy 10/14/2002  . Coronary artery disease   . Fatigue 03/29/2012   Past Surgical History  Procedure Laterality Date  . Cardiac defibrillator placement  3/08    St. Jude  . Ptca  1997  . Coronary artery bypass graft  1996    last cath reports an atretic LIMA to the LAD and patent SVG to RCA   Family History  Problem Relation Age of Onset  . Diabetes    . Heart disease Father   . Ulcers Father   . Hyperlipidemia Father    . Hypertension Father   . Cancer Brother     lung cancer  . Cancer Brother     throat cancer   History   Social History  . Marital Status: Married    Spouse Name: N/A    Number of Children: 4  . Years of Education: N/A   Occupational History  . Employed in Airline pilot in the [employer]    Social History Main Topics  . Smoking status: Former Smoker    Types: Cigars  . Smokeless tobacco: Never Used     Comment: quit smoking cigerettes in 1996  . Alcohol Use: 10.5 oz/week    21 drink(s) per week     Comment: 2 glasses of wine daily  . Drug Use: No  . Sexually Active: Not Currently   Other Topics Concern  . Not on file   Social History Narrative   HSG, College in '75 x 2 years. Married '57 - 56yr/divorced; married '69- 7 yrs/divorced; married '79 - 77yrs/divorced; '96 -. 3 boys - '58, '59, '60; 1 girl - '61; 9 grandchildren, 4 great-grands. 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 ACP - yes CPR, no long term mechanical ventilation; no heroic measures in the face of irreversible disease or disability.    Current Outpatient Prescriptions on File Prior to Visit  Medication Sig Dispense Refill  . amiodarone (PACERONE) 200 MG tablet Take 1 tablet (200 mg total) by mouth daily.  30 tablet  5  . atenolol (TENORMIN) 100 MG tablet Take 1 tablet (100 mg total) by mouth daily.  180 tablet  3  . clobetasol cream (TEMOVATE) 0.05 % Apply 1 application topically daily.       . digoxin (LANOXIN) 0.125 MG tablet Take 1 tablet (0.125 mg total) by mouth daily.  90 tablet  3  . fish oil-omega-3 fatty acids 1000 MG capsule Take 1 g by mouth 2 (two) times daily.        . Multiple Vitamin (MULTIVITAMIN) tablet Take 1 tablet by mouth daily.        . nitroGLYCERIN (NITROSTAT) 0.4 MG SL tablet Place 0.4 mg under the tongue every 5 (five) minutes as needed. For chest pain.       . ramipril (ALTACE) 5 MG capsule TAKE 1 CAPSULE DAILY  90 capsule  3  . rosuvastatin (CRESTOR) 40 MG tablet Take  1 tablet (40 mg total) by mouth daily. STOP SIMVASTATIN  90 tablet  3  . warfarin (COUMADIN) 4 MG tablet Take 1/2 tablet by mouth once daily       No current facility-administered medications on file prior to visit.      Review of Systems Constitutional:  Negative for fever, chills, activity change and unexpected weight change.  HEENT:  Negative for hearing loss, ear pain, congestion, neck stiffness and postnasal drip. Negative for sore throat or swallowing problems. Negative for dental complaints.  Eyes: Negative for vision loss or change in visual acuity.  Respiratory: Negative for chest tightness and wheezing. Negative for DOE.   Cardiovascular: Negative for chest pain or palpitations. No decreased exercise tolerance Gastrointestinal: No change in bowel habit. No bloating or gas. No reflux or indigestion Genitourinary: Negative for urgency, frequency, flank pain and difficulty urinating.  Musculoskeletal: Negative for myalgias, back pain, arthralgias and gait problem.  Neurological: Negative for dizziness, tremors, weakness and headaches.  Hematological: Negative for adenopathy.  Psychiatric/Behavioral: Negative for behavioral problems and dysphoric mood.       Objective:   Physical Exam Filed Vitals:   11/19/12 0856  BP: 110/66  Pulse: 68  Temp: 97.6 F (36.4 C)  Resp: 12   Wt Readings from Last 3 Encounters:  11/19/12 210 lb 12.8 oz (95.618 kg)  11/08/12 211 lb 8 oz (95.936 kg)  07/20/12 208 lb (94.348 kg)   Gen'l: Well nourished well developed white male in no acute distress  HEENT: Head: Normocephalic and atraumatic. Right Ear: External ear normal.Hearing aid in place. Left Ear: External ear normal. Hearing aid in place. Nose: Nose normal. Mouth/Throat: Oropharynx is clear and moist. Dentition - native, in good repair. No buccal or palatal lesions. Posterior pharynx clear. Eyes: Conjunctivae and sclera clear. EOM intact. Pupils are equal, round, and reactive to light.  Right eye exhibits no discharge. Left eye exhibits no discharge. Neck: Normal range of motion. Neck supple. No JVD present. No tracheal deviation present. No thyromegaly present.  Cardiovascular: Normal rate, regular rhythm, no gallop, no friction rub, no murmur heard.      Quiet precordium. ICD left upper chest. 2+ radial and DP pulses . No carotid bruits Pulmonary/Chest: Effort normal. No respiratory distress or increased WOB, no wheezes, no rales. No chest wall deformity or CVAT. Abdomen: Soft. Bowel sounds are normal in all quadrants. He exhibits no distension, no tenderness, no rebound or guarding, No heptosplenomegaly  Genitourinary:  deferred to age Musculoskeletal: Normal range of motion. He exhibits no edema and no tenderness.       Small and large joints without redness. Right index PIP with synovial thickening. Full range of motion preserved about all small, median and large joints.  Lymphadenopathy:    He has no cervical or supraclavicular adenopathy.  Neurological: He is alert and oriented to person, place, and time. CN II-XII intact. DTRs 2+ and symmetrical biceps, radial and patellar tendons. Cerebellar function normal with no tremor, rigidity, normal gait and station.  Skin: Skin is warm and dry. No rash noted. No erythema.  Psychiatric: He has a normal mood and affect. His behavior is normal. Thought content normal.   Recent Results (from the past 2160 hour(s))  POCT INR     Status: None   Collection Time    08/24/12  8:40 AM      Result Value Range   INR 2.4    POCT INR     Status: None   Collection Time    09/14/12  8:35 AM      Result Value Range   INR 3.0    POCT INR     Status: None   Collection Time    10/12/12  8:19 AM      Result Value Range   INR 6.0    PROTIME-INR     Status: Abnormal   Collection Time    10/12/12  8:30 AM      Result Value Range   Prothrombin Time 64.4 (*) 10.2 - 12.4  sec   INR 6.3 (*) 0.8 - 1.0 ratio  POCT INR     Status: None    Collection Time    10/16/12  8:39 AM      Result Value Range   INR 2.8    POCT INR     Status: None   Collection Time    10/26/12  8:21 AM      Result Value Range   INR 3.3    POCT INR     Status: None   Collection Time    11/08/12  3:50 PM      Result Value Range   INR 2.8    ICD DEVICE OBSERVATION     Status: None   Collection Time    11/08/12  4:06 PM      Result Value Range   DEVICE MODEL ICD 782956     DEV-0014ICD Duke Salvia  M.D.     DEV-0020ICD N     DEV-0014LDO Duke Salvia  M.D.     [id number] Duke Salvia  M.D.     PACEART [facility] NOTES ICD       Value: ICD check in clinic by Dr. Graciela Husbands. Normal device function. Sensing consistent with previous device measurements. Impedance trends stable over time. No evidence of any ventricular arrhythmias. No mode switches. Histogram distribution appropriate for      patient and level of activity. No changes made this session. Device programmed at appropriate safety margins. Device programmed to optimize intrinsic conduction. Estimated longevity 47yrs. ROV w/ SK in 3 mo.   CHARGE TIME 13.4     AL IMPEDENCE ICD 425.0     RV LEAD IMPEDENCE ICD 362.5     HV IMPEDENCE 38     BATTERY VOLTAGE 2.5568     VF 0     FVT 0     PACEART VT 0     MODE SWITCH EPISODES 2.0     VENTRICULAR PACING ICD 12.0     ATRIAL PACING ICD 99.0     TOT-0006 [id number]     TOT-0007 2     TOT-0008 0     TOT-0009 4     TOT-0010 43     AL AMPLITUDE 2.1     RV LEAD AMPLITUDE 10.7     BAMS-0001 150     BAMS-0003 60     TZON-0002SLOWVT ATP + 4 Shocks     TZON-0003SLOWVT 475     TZON-0004SLOWVT 50     TZON-0005SLOWVT 6     TZON-0008SLOWVT Off     TZON-0010SLOWVT 80     TZAT-0001SLOWVT 1     TZAT-0002SLOWVT Y     TZAT-0003SLOWVT Burst     TZAT-0004SLOWVT 8     TZAT-0012SLOWVT 200     TZAT-0013SLOWVT 10     TZAT-0018SLOWVT N     TZAT-0019SLOWVT 7.5     TZAT-0020SLOWVT 1.0     TZST-0001SLOWVT 2     TZST-0002SLOWVT Y     TZST-0003SLOWVT  15.0     TZST-0004SLOWVT Biphasic     TZST-0007SLOWVT B > AX     TZST-0001SLOWVT 3     TZST-0002SLOWVT Y     TZST-0003SLOWVT 25.0     TZST-0004SLOWVT Biphasic     TZST-0007SLOWVT B > AX     TZST-0001SLOWVT 4     TZST-0002SLOWVT Y     TZST-0003SLOWVT 36.0     TZST-0004SLOWVT Biphasic     TZST-0007SLOWVT B > AX     TZST-0001SLOWVT  5     TZST-0002SLOWVT Y     TZST-0004SLOWVT Biphasic     TZST-0007SLOWVT B > AX    HEPATIC FUNCTION PANEL     Status: None   Collection Time    11/08/12  4:17 PM      Result Value Range   Total Bilirubin 0.6  0.3 - 1.2 mg/dL   Bilirubin, Direct 0.1  0.0 - 0.3 mg/dL   Alkaline Phosphatase 60  39 - 117 U/L   AST 27  0 - 37 U/L   ALT 28  0 - 53 U/L   Total Protein 7.2  6.0 - 8.3 g/dL   Albumin 4.1  3.5 - 5.2 g/dL  TSH     Status: None   Collection Time    11/08/12  4:17 PM      Result Value Range   TSH 1.58  0.35 - 5.50 uIU/mL  LIPID PANEL     Status: Abnormal   Collection Time    11/19/12  9:54 AM      Result Value Range   Cholesterol 131  0 - 200 mg/dL   Comment: ATP III Classification       Desirable:  < 200 mg/dL               Borderline High:  200 - 239 mg/dL          High:  > = 409 mg/dL   Triglycerides 811.9 (*) 0.0 - 149.0 mg/dL   Comment: Normal:  <147 mg/dLBorderline High:  150 - 199 mg/dL   HDL 82.95  >62.13 mg/dL   VLDL 08.6  0.0 - 57.8 mg/dL   LDL Cholesterol 55  0 - 99 mg/dL   Total CHOL/HDL Ratio 3     Comment:                Men          Women1/2 Average Risk     3.4          3.3Average Risk          5.0          4.42X Average Risk          9.6          7.13X Average Risk          15.0          11.0                      COMPREHENSIVE METABOLIC PANEL     Status: Abnormal   Collection Time    11/19/12  9:54 AM      Result Value Range   Sodium 135  135 - 145 mEq/L   Potassium 4.6  3.5 - 5.1 mEq/L   Chloride 100  96 - 112 mEq/L   CO2 32  19 - 32 mEq/L   Glucose, Bld 111 (*) 70 - 99 mg/dL   BUN 15  6 - 23 mg/dL   Creatinine, Ser  1.2  0.4 - 1.5 mg/dL   Total Bilirubin 0.6  0.3 - 1.2 mg/dL   Alkaline Phosphatase 64  39 - 117 U/L   AST 28  0 - 37 U/L   ALT 33  0 - 53 U/L   Total Protein 7.1  6.0 - 8.3 g/dL   Albumin 4.0  3.5 - 5.2 g/dL   Calcium 9.3  8.4 - 46.9 mg/dL   GFR 62.95  >28.41 mL/min  Assessment & Plan:

## 2012-11-20 DIAGNOSIS — Z0000 Encounter for general adult medical examination without abnormal findings: Secondary | ICD-10-CM | POA: Insufficient documentation

## 2012-11-20 NOTE — Assessment & Plan Note (Signed)
BP Readings from Last 3 Encounters:  11/19/12 110/66  07/20/12 112/60  05/03/12 128/70   Very good control. Bmet is normal  Plan Continue present regimen

## 2012-11-20 NOTE — Assessment & Plan Note (Signed)
Lipid profile reveals excxellent control with LDL well below goal of 80 or less. Liver functions are normal.  Plan Continue present medical therapy

## 2012-11-20 NOTE — Assessment & Plan Note (Signed)
Stable with no report of chest pain or other symptoms.  Plan  continue risk reduction therapies

## 2012-11-20 NOTE — Assessment & Plan Note (Signed)
Patient reports he has been stable with a controlled heart rate. He has experienced a couple of episodes of syncope and discharge of his AICD subsequently seen by Dr. Graciela Husbands.  Plan Follow up with Dr. Graciela Husbands as instructed.

## 2012-11-20 NOTE — Assessment & Plan Note (Signed)
Interval history notable for two episodes of syncope and ACID discharge. Currently he is stable. No other intercurrent problems or hospitalizations. Physical exam is notable for AICD left chest, hearing aids otherwise normal. Lab results reviewed and in normal range. He reports he has had colonoscopy - report will be located. Immunizations up to date except for shingles - he will check on coverage.  He has aged out of prostate screening.  In summary  A very nice man with a complex cardiac history who appears medically stable at this time. He will return as needed or in 1 year.

## 2012-11-29 ENCOUNTER — Ambulatory Visit (INDEPENDENT_AMBULATORY_CARE_PROVIDER_SITE_OTHER): Payer: Medicare Other | Admitting: *Deleted

## 2012-11-29 DIAGNOSIS — I4891 Unspecified atrial fibrillation: Secondary | ICD-10-CM

## 2012-11-29 DIAGNOSIS — Z7901 Long term (current) use of anticoagulants: Secondary | ICD-10-CM

## 2012-11-29 LAB — POCT INR: INR: 3.3

## 2012-12-20 ENCOUNTER — Ambulatory Visit (INDEPENDENT_AMBULATORY_CARE_PROVIDER_SITE_OTHER): Payer: Medicare Other | Admitting: *Deleted

## 2012-12-20 DIAGNOSIS — I4891 Unspecified atrial fibrillation: Secondary | ICD-10-CM

## 2012-12-20 DIAGNOSIS — Z7901 Long term (current) use of anticoagulants: Secondary | ICD-10-CM

## 2012-12-20 MED ORDER — WARFARIN SODIUM 2 MG PO TABS: ORAL_TABLET | ORAL | Status: DC

## 2012-12-24 ENCOUNTER — Other Ambulatory Visit: Payer: Self-pay | Admitting: Internal Medicine

## 2012-12-24 DIAGNOSIS — R6889 Other general symptoms and signs: Secondary | ICD-10-CM

## 2012-12-25 ENCOUNTER — Other Ambulatory Visit: Payer: Self-pay | Admitting: *Deleted

## 2012-12-25 DIAGNOSIS — Z4502 Encounter for adjustment and management of automatic implantable cardiac defibrillator: Secondary | ICD-10-CM

## 2012-12-25 DIAGNOSIS — I4891 Unspecified atrial fibrillation: Secondary | ICD-10-CM

## 2012-12-25 DIAGNOSIS — R55 Syncope and collapse: Secondary | ICD-10-CM

## 2012-12-25 DIAGNOSIS — I255 Ischemic cardiomyopathy: Secondary | ICD-10-CM

## 2012-12-25 DIAGNOSIS — I472 Ventricular tachycardia: Secondary | ICD-10-CM

## 2012-12-25 DIAGNOSIS — I5042 Chronic combined systolic (congestive) and diastolic (congestive) heart failure: Secondary | ICD-10-CM

## 2012-12-25 MED ORDER — RAMIPRIL 5 MG PO CAPS: ORAL_CAPSULE | ORAL | Status: DC

## 2012-12-25 MED ORDER — AMIODARONE HCL 200 MG PO TABS: 200.0000 mg | ORAL_TABLET | Freq: Every day | ORAL | Status: DC

## 2012-12-25 MED ORDER — ATENOLOL 100 MG PO TABS: 100.0000 mg | ORAL_TABLET | Freq: Every day | ORAL | Status: DC

## 2012-12-26 ENCOUNTER — Other Ambulatory Visit: Payer: Self-pay | Admitting: *Deleted

## 2012-12-26 ENCOUNTER — Ambulatory Visit (INDEPENDENT_AMBULATORY_CARE_PROVIDER_SITE_OTHER): Payer: Medicare Other | Admitting: Internal Medicine

## 2012-12-26 ENCOUNTER — Other Ambulatory Visit (INDEPENDENT_AMBULATORY_CARE_PROVIDER_SITE_OTHER): Payer: Medicare Other

## 2012-12-26 ENCOUNTER — Encounter: Payer: Self-pay | Admitting: Internal Medicine

## 2012-12-26 VITALS — BP 100/70 | HR 69 | Temp 97.8°F | Wt 209.4 lb

## 2012-12-26 DIAGNOSIS — I5042 Chronic combined systolic (congestive) and diastolic (congestive) heart failure: Secondary | ICD-10-CM

## 2012-12-26 DIAGNOSIS — I472 Ventricular tachycardia: Secondary | ICD-10-CM

## 2012-12-26 DIAGNOSIS — R6889 Other general symptoms and signs: Secondary | ICD-10-CM

## 2012-12-26 DIAGNOSIS — Z4502 Encounter for adjustment and management of automatic implantable cardiac defibrillator: Secondary | ICD-10-CM

## 2012-12-26 DIAGNOSIS — I4891 Unspecified atrial fibrillation: Secondary | ICD-10-CM

## 2012-12-26 DIAGNOSIS — I255 Ischemic cardiomyopathy: Secondary | ICD-10-CM

## 2012-12-26 DIAGNOSIS — M2550 Pain in unspecified joint: Secondary | ICD-10-CM

## 2012-12-26 DIAGNOSIS — R55 Syncope and collapse: Secondary | ICD-10-CM

## 2012-12-26 LAB — CBC WITH DIFFERENTIAL/PLATELET
Eosinophils Relative: 1.3 % (ref 0.0–5.0)
HCT: 40.3 % (ref 39.0–52.0)
Lymphs Abs: 0.7 10*3/uL (ref 0.7–4.0)
Monocytes Relative: 5.3 % (ref 3.0–12.0)
Platelets: 185 10*3/uL (ref 150.0–400.0)
WBC: 8 10*3/uL (ref 4.5–10.5)

## 2012-12-26 LAB — SEDIMENTATION RATE: Sed Rate: 29 mm/hr — ABNORMAL HIGH (ref 0–22)

## 2012-12-26 NOTE — Patient Instructions (Addendum)
Rigors - sudden on set of chills/shakes and feeling weak. Need to be on the look out for infection. Plan Blood count today  When you have an attack please get blood cultures done as soon as possible: at the office during working hours or at the ED after hours using the Prescription I gave you wife.  Migratory joint pain - concern for arthritis: osteoarthrits (wear and tear) vs rheumatoid arthritis Plan  lab work today - results will be on MyChart.

## 2012-12-26 NOTE — Progress Notes (Signed)
  Subjective:    Patient ID: [name], male    DOB: 1933/08/13, 79 y.o.   MRN: [id number]  HPI [name] presents due to a history of chills/rigors that occur sporadically every several months. Last episode was in April after being at the coumadin clinic. He had severe tremors, felt cold and weak. He felt bad until the next day.   He also has migratory joint inflammation, swelling and pain.  Past Medical History  Diagnosis Date  . DVT (deep venous thrombosis)   . Ischemic cardiomyopathy     CABG vx4 1986 and PTCA in 1991; stent 1997 cath 2008  . Dyslipidemia   . HTN (hypertension)   . Atrial fibrillation     On Tikosyn  . Ventricular tachycardia     With prior shock therapy and antitachycardia pacing  . Automatic implantable cardiac defibrillator St Judes 3/08    St. Jude  . History of colonoscopy 10/14/2002  . Coronary artery disease   . Fatigue 03/29/2012   Past Surgical History  Procedure Laterality Date  . Cardiac defibrillator placement  3/08    St. Jude  . Ptca  1997  . Coronary artery bypass graft  1996    last cath reports an atretic LIMA to the LAD and patent SVG to RCA   Family History  Problem Relation Age of Onset  . Diabetes    . Heart disease Father   . Ulcers Father   . Hyperlipidemia Father   . Hypertension Father   . Cancer Brother     lung cancer  . Cancer Brother     throat cancer   History   Social History  . Marital Status: Married    Spouse Name: N/A    Number of Children: 4  . Years of Education: N/A   Occupational History  . Employed in Airline pilot in the [employer]    Social History Main Topics  . Smoking status: Former Smoker    Types: Cigars  . Smokeless tobacco: Never Used     Comment: quit smoking cigerettes in 1996  . Alcohol Use: 10.5 oz/week    21 drink(s) per week     Comment: 2 glasses of wine daily  . Drug Use: No  . Sexually Active: Not Currently   Other Topics Concern  . Not on file   Social History Narrative    HSG, College in '75 x 2 years. Married '57 - 79yr/divorced; married '69- 7 yrs/divorced; married '79 - 11yrs/divorced; '96 -. 3 boys - '58, '59, '60; 1 girl - '61; 9 grandchildren, 4 great-grands. 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 ACP - yes CPR, no long term mechanical ventilation; no heroic measures in the face of irreversible disease or disability.     Review of Systems System review is negative for any constitutional, cardiac, pulmonary, GI or neuro symptoms or complaints other than as described in the HPI.     Objective:   Physical Exam Filed Vitals:   12/26/12 1116  BP: 100/70  Pulse: 69  Temp: 97.8 F (36.6 C)   Gen'l- WNWD white man in no distress HEENT- C&S clear Cor- 2+ radial, RRR Chest - pacemaker/ACID left upper chest Lungs - CTAP MSK - no synovial thickening, erythema, deformity small and medium joints       Assessment & Plan:  Migratory joint pain - exam is unrevealing.  Plan Lab: ESR, CRP, RF to r/o OA vs RA

## 2012-12-27 DIAGNOSIS — R6889 Other general symptoms and signs: Secondary | ICD-10-CM | POA: Insufficient documentation

## 2012-12-27 LAB — RHEUMATOID FACTOR: Rhuematoid fact SerPl-aCnc: <10 IU/mL (ref <=14)

## 2012-12-27 NOTE — Assessment & Plan Note (Signed)
Intermittent, every several months, episodes of rigors, weakness and fatigue that will last several hours. Concern for periodic bacteremia. No evidence of infection on exam  Plan CBCD  Blood cultures x 2 site during episode of rigors.

## 2012-12-31 ENCOUNTER — Other Ambulatory Visit: Payer: Self-pay | Admitting: *Deleted

## 2012-12-31 DIAGNOSIS — I472 Ventricular tachycardia: Secondary | ICD-10-CM

## 2012-12-31 DIAGNOSIS — I255 Ischemic cardiomyopathy: Secondary | ICD-10-CM

## 2012-12-31 DIAGNOSIS — I4891 Unspecified atrial fibrillation: Secondary | ICD-10-CM

## 2012-12-31 DIAGNOSIS — R55 Syncope and collapse: Secondary | ICD-10-CM

## 2012-12-31 DIAGNOSIS — I5042 Chronic combined systolic (congestive) and diastolic (congestive) heart failure: Secondary | ICD-10-CM

## 2012-12-31 DIAGNOSIS — Z4502 Encounter for adjustment and management of automatic implantable cardiac defibrillator: Secondary | ICD-10-CM

## 2012-12-31 MED ORDER — DIGOXIN 125 MCG PO TABS: 0.1250 mg | ORAL_TABLET | Freq: Every day | ORAL | Status: DC

## 2013-01-03 ENCOUNTER — Ambulatory Visit (INDEPENDENT_AMBULATORY_CARE_PROVIDER_SITE_OTHER): Payer: Medicare Other | Admitting: *Deleted

## 2013-01-03 DIAGNOSIS — I4891 Unspecified atrial fibrillation: Secondary | ICD-10-CM

## 2013-01-03 DIAGNOSIS — Z7901 Long term (current) use of anticoagulants: Secondary | ICD-10-CM

## 2013-01-03 LAB — POCT INR: INR: 3.5

## 2013-01-17 ENCOUNTER — Ambulatory Visit (INDEPENDENT_AMBULATORY_CARE_PROVIDER_SITE_OTHER): Payer: Medicare Other

## 2013-01-17 DIAGNOSIS — Z7901 Long term (current) use of anticoagulants: Secondary | ICD-10-CM

## 2013-01-17 DIAGNOSIS — I4891 Unspecified atrial fibrillation: Secondary | ICD-10-CM

## 2013-01-17 LAB — POCT INR: INR: 2.1

## 2013-02-05 ENCOUNTER — Ambulatory Visit (INDEPENDENT_AMBULATORY_CARE_PROVIDER_SITE_OTHER): Payer: Medicare Other | Admitting: Pharmacist

## 2013-02-05 DIAGNOSIS — I4891 Unspecified atrial fibrillation: Secondary | ICD-10-CM

## 2013-02-05 DIAGNOSIS — Z7901 Long term (current) use of anticoagulants: Secondary | ICD-10-CM

## 2013-02-12 ENCOUNTER — Ambulatory Visit: Payer: Medicare Other | Admitting: Internal Medicine

## 2013-02-21 ENCOUNTER — Ambulatory Visit (INDEPENDENT_AMBULATORY_CARE_PROVIDER_SITE_OTHER): Payer: Medicare Other | Admitting: General Practice

## 2013-02-21 ENCOUNTER — Encounter: Payer: Self-pay | Admitting: Internal Medicine

## 2013-02-21 ENCOUNTER — Ambulatory Visit (INDEPENDENT_AMBULATORY_CARE_PROVIDER_SITE_OTHER): Payer: Medicare Other | Admitting: Internal Medicine

## 2013-02-21 VITALS — BP 119/76 | HR 60 | Ht 70.0 in | Wt 209.0 lb

## 2013-02-21 DIAGNOSIS — I255 Ischemic cardiomyopathy: Secondary | ICD-10-CM

## 2013-02-21 DIAGNOSIS — I4891 Unspecified atrial fibrillation: Secondary | ICD-10-CM

## 2013-02-21 DIAGNOSIS — I472 Ventricular tachycardia, unspecified: Secondary | ICD-10-CM

## 2013-02-21 DIAGNOSIS — Z4502 Encounter for adjustment and management of automatic implantable cardiac defibrillator: Secondary | ICD-10-CM

## 2013-02-21 DIAGNOSIS — I509 Heart failure, unspecified: Secondary | ICD-10-CM

## 2013-02-21 DIAGNOSIS — I5042 Chronic combined systolic (congestive) and diastolic (congestive) heart failure: Secondary | ICD-10-CM

## 2013-02-21 DIAGNOSIS — Z7901 Long term (current) use of anticoagulants: Secondary | ICD-10-CM

## 2013-02-21 DIAGNOSIS — R55 Syncope and collapse: Secondary | ICD-10-CM

## 2013-02-21 DIAGNOSIS — I2589 Other forms of chronic ischemic heart disease: Secondary | ICD-10-CM

## 2013-02-21 DIAGNOSIS — Z95810 Presence of automatic (implantable) cardiac defibrillator: Secondary | ICD-10-CM

## 2013-02-21 LAB — ICD DEVICE OBSERVATION
AL AMPLITUDE: 1.1 mv
AL THRESHOLD: 0.75 V
ATRIAL PACING ICD: 99 pct
BAMS-0003: 60 {beats}/min
DEV-0020ICD: NEGATIVE
DEVICE MODEL ICD: 430236
FVT: 0
PACEART VT: 0
RV LEAD AMPLITUDE: 11.9 mv
RV LEAD IMPEDENCE ICD: 375 Ohm
TOT-0008: 0
TOT-0009: 4
TZAT-0001SLOWVT: 1
TZAT-0012SLOWVT: 200 ms
TZAT-0019SLOWVT: 7.5 V
TZAT-0020SLOWVT: 1 ms
TZON-0004SLOWVT: 50
TZON-0005SLOWVT: 6
TZON-0010SLOWVT: 80 ms
TZST-0001SLOWVT: 2
TZST-0001SLOWVT: 4
TZST-0003SLOWVT: 15 J
TZST-0003SLOWVT: 36 J
VENTRICULAR PACING ICD: 14 pct
VF: 0

## 2013-02-21 LAB — POCT INR: INR: 1.6

## 2013-02-21 MED ORDER — DIGOXIN 125 MCG PO TABS: ORAL_TABLET | ORAL | Status: DC

## 2013-02-21 NOTE — Assessment & Plan Note (Signed)
Stable

## 2013-02-21 NOTE — Progress Notes (Signed)
Patient Care Team: Jacques Navy, MD as PCP - General Duke Salvia, MD as Attending Physician (Cardiology)   HPI  [name] is a 79 y.o. male seen in followup for ventricular tachycardia and atrial fibrillation s/p ICD implantation with both appropriate and inappropriate therapy. He has a history of ischemic heart disease with prior CABG and moderate depression of LV systolic function.    He's been having episodes of polymorphic and monomorphic ventricular tachycardia.   Most recently tikosyn was discontinued and amiodarone initiated; ICD lower rate limit was decreased   He has been doing beautifully. He has had no intercurrent arrhythmias. He denies chest pain shortness of breath. He is limited by pain in his feet.   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  Ejection fraction was 48%    Past Medical History  Diagnosis Date  . DVT (deep venous thrombosis)   . Ischemic cardiomyopathy     CABG vx4 1986 and PTCA in 1991; stent 1997 cath 2008  . Dyslipidemia   . HTN (hypertension)   . Atrial fibrillation     On Tikosyn  . Ventricular tachycardia     With prior shock therapy and antitachycardia pacing  . Automatic implantable cardiac defibrillator St Judes 3/08    St. Jude  . History of colonoscopy 10/14/2002  . Coronary artery disease   . Fatigue 03/29/2012    Past Surgical History  Procedure Laterality Date  . Cardiac defibrillator placement  3/08    St. Jude  . Ptca  1997  . Coronary artery bypass graft  1996    last cath reports an atretic LIMA to the LAD and patent SVG to RCA    Current Outpatient Prescriptions  Medication Sig Dispense Refill  . amiodarone (PACERONE) 200 MG tablet Take 1 tablet (200 mg total) by mouth daily.  90 tablet  1  . atenolol  (TENORMIN) 100 MG tablet Take 1 tablet (100 mg total) by mouth daily.  90 tablet  1  . clobetasol cream (TEMOVATE) 0.05 % Apply 1 application topically daily.       . digoxin (LANOXIN) 0.125 MG tablet Take 1 tablet (0.125 mg total) by mouth daily.  90 tablet  3  . fish oil-omega-3 fatty acids 1000 MG capsule Take 1 g by mouth 2 (two) times daily.        . Multiple Vitamin (MULTIVITAMIN) tablet Take 1 tablet by mouth daily.        . nitroGLYCERIN (NITROSTAT) 0.4 MG SL tablet Place 0.4 mg under the tongue every 5 (five) minutes as needed. For chest pain.       . ramipril (ALTACE) 5 MG capsule TAKE 1 CAPSULE DAILY  90 capsule  1  . rosuvastatin (CRESTOR) 40 MG tablet Take 1 tablet (40 mg total) by mouth daily. STOP SIMVASTATIN  90 tablet  3  . warfarin (COUMADIN) 2 MG tablet Take as directed by coumadin clinic  120 tablet  1   No current facility-administered medications for this visit.    No Known Allergies  Review of Systems negative except from HPI and PMH  Physical Exam BP 119/76  Pulse 60  Ht 5\' 10"  (1.778 m)  Wt 209 lb (94.802 kg)  BMI 29.99 kg/m2 Well developed and nourished in no acute distress HENT normal Neck supple  with JVP-flat Clear Device pocket well healed; without hematoma or erythema.  There is no tethering Regular rate and rhythm, no murmurs or gallops Abd-soft with active BS No Clubbing cyanosis edema Skin-warm and dry A & Oriented  Grossly normal sensory and motor function  ECG AV pacing  Assessment and  Plan

## 2013-02-21 NOTE — Assessment & Plan Note (Signed)
No recurrent vt 

## 2013-02-21 NOTE — Assessment & Plan Note (Addendum)
euvolemic  Continue current meds  Decrease dig to qod

## 2013-02-21 NOTE — Assessment & Plan Note (Signed)
Stable   On coumadin.

## 2013-02-21 NOTE — Patient Instructions (Addendum)
Remote monitoring is used to monitor your Pacemaker of ICD from home. This monitoring reduces the number of office visits required to check your device to one time per year. It allows [place] to keep an eye on the functioning of your device to ensure it is working properly. You are scheduled for a device check from home on 05/28/2013. You may send your transmission at any time that day. If you have a wireless device, the transmission will be sent automatically. After your physician reviews your transmission, you will receive a postcard with your next transmission date.  Your physician has recommended you make the following change in your medication:  1) Decrease Digoxin to 0.125 mg every other day   Your physician wants you to follow-up in: one year with Dr. Graciela Husbands. You will receive a reminder letter in the mail two months in advance. If you don't receive a letter, please call our office to schedule the follow-up appointment.

## 2013-02-21 NOTE — Assessment & Plan Note (Signed)
The patient's device was interrogated.  The information was reviewed. No changes were made in the programming.    

## 2013-02-27 ENCOUNTER — Encounter: Payer: Self-pay | Admitting: Internal Medicine

## 2013-03-07 ENCOUNTER — Telehealth: Payer: Self-pay

## 2013-03-07 ENCOUNTER — Ambulatory Visit (INDEPENDENT_AMBULATORY_CARE_PROVIDER_SITE_OTHER): Payer: Medicare Other | Admitting: General Practice

## 2013-03-07 DIAGNOSIS — I4891 Unspecified atrial fibrillation: Secondary | ICD-10-CM

## 2013-03-07 DIAGNOSIS — Z7901 Long term (current) use of anticoagulants: Secondary | ICD-10-CM

## 2013-03-07 NOTE — Telephone Encounter (Signed)
This patient came into our practice and stated he had recently traveled to an area where Ebola patients had been. The patient was asked to put on a mask, then he was escorted to a room for temporary isolation and questioned in reference to his exposure and his current health status. The patient denied any fever,cough or other symptoms of Ebola. He also denies any direct contact with anyone diagnosised with or having symptoms of Ebola. IP dept at [facility] called and advised of this patient. The patient had his coumadin check completed and no further instructions from IP were given so the patient was discharged.

## 2013-03-28 ENCOUNTER — Other Ambulatory Visit: Payer: Self-pay

## 2013-04-04 ENCOUNTER — Ambulatory Visit (INDEPENDENT_AMBULATORY_CARE_PROVIDER_SITE_OTHER): Payer: Medicare Other | Admitting: *Deleted

## 2013-04-04 DIAGNOSIS — Z7901 Long term (current) use of anticoagulants: Secondary | ICD-10-CM

## 2013-04-04 DIAGNOSIS — Z23 Encounter for immunization: Secondary | ICD-10-CM

## 2013-04-04 DIAGNOSIS — I4891 Unspecified atrial fibrillation: Secondary | ICD-10-CM

## 2013-04-04 LAB — POCT INR: INR: 2.3

## 2013-05-02 ENCOUNTER — Ambulatory Visit (INDEPENDENT_AMBULATORY_CARE_PROVIDER_SITE_OTHER): Payer: Medicare Other | Admitting: *Deleted

## 2013-05-02 DIAGNOSIS — I4891 Unspecified atrial fibrillation: Secondary | ICD-10-CM

## 2013-05-02 DIAGNOSIS — Z7901 Long term (current) use of anticoagulants: Secondary | ICD-10-CM

## 2013-05-02 LAB — POCT INR: INR: 1.8

## 2013-05-24 ENCOUNTER — Other Ambulatory Visit: Payer: Self-pay

## 2013-05-24 MED ORDER — ROSUVASTATIN CALCIUM 40 MG PO TABS: 40.0000 mg | ORAL_TABLET | Freq: Every day | ORAL | Status: DC

## 2013-05-28 ENCOUNTER — Ambulatory Visit (INDEPENDENT_AMBULATORY_CARE_PROVIDER_SITE_OTHER): Payer: Medicare Other | Admitting: *Deleted

## 2013-05-28 DIAGNOSIS — I4891 Unspecified atrial fibrillation: Secondary | ICD-10-CM

## 2013-05-30 ENCOUNTER — Ambulatory Visit (INDEPENDENT_AMBULATORY_CARE_PROVIDER_SITE_OTHER): Payer: Medicare Other | Admitting: *Deleted

## 2013-05-30 DIAGNOSIS — Z7901 Long term (current) use of anticoagulants: Secondary | ICD-10-CM

## 2013-05-30 DIAGNOSIS — I4891 Unspecified atrial fibrillation: Secondary | ICD-10-CM

## 2013-05-30 LAB — POCT INR: INR: 1.5

## 2013-05-31 ENCOUNTER — Other Ambulatory Visit: Payer: Self-pay

## 2013-05-31 MED ORDER — ROSUVASTATIN CALCIUM 40 MG PO TABS: 40.0000 mg | ORAL_TABLET | Freq: Every day | ORAL | Status: DC

## 2013-06-03 ENCOUNTER — Telehealth: Payer: Self-pay | Admitting: Internal Medicine

## 2013-06-03 NOTE — Telephone Encounter (Signed)
Spoke w/ pt's spouse. Remotes were received today & twice on 05/28/13.

## 2013-06-03 NOTE — Telephone Encounter (Signed)
Pt left message on my voicemail that he got a mychart message that he missed a remote check, so he tried to do it today and its not working, pls call /mt

## 2013-06-10 LAB — MDC_IDC_ENUM_SESS_TYPE_REMOTE
Battery Remaining Longevity: 6 mo
Brady Statistic AP VS Percent: 89 %
Brady Statistic AS VP Percent: 1 %
Brady Statistic RA Percent Paced: 99 %
Brady Statistic RV Percent Paced: 10 %
Date Time Interrogation Session: 20150106075214
HighPow Impedance: 40 Ohm
Lead Channel Impedance Value: 380 Ohm
Lead Channel Impedance Value: 450 Ohm
Lead Channel Setting Pacing Amplitude: 2 V
Lead Channel Setting Pacing Pulse Width: 0.5 ms
Lead Channel Setting Sensing Sensitivity: 0.3 mV
MDC IDC MSMT BATTERY VOLTAGE: 2.51 V
MDC IDC MSMT LEADCHNL RA SENSING INTR AMPL: 1.7 mV
MDC IDC MSMT LEADCHNL RV SENSING INTR AMPL: 11.9 mV
MDC IDC PG SERIAL: 430236
MDC IDC SET LEADCHNL RV PACING AMPLITUDE: 2.5 V
MDC IDC STAT BRADY AP VP PERCENT: 10 %
MDC IDC STAT BRADY AS VS PERCENT: 1 %
Zone Setting Detection Interval: 270 ms
Zone Setting Detection Interval: 475 ms

## 2013-06-13 ENCOUNTER — Ambulatory Visit (INDEPENDENT_AMBULATORY_CARE_PROVIDER_SITE_OTHER): Payer: Medicare Other | Admitting: *Deleted

## 2013-06-13 DIAGNOSIS — Z5181 Encounter for therapeutic drug level monitoring: Secondary | ICD-10-CM | POA: Insufficient documentation

## 2013-06-13 DIAGNOSIS — Z7901 Long term (current) use of anticoagulants: Secondary | ICD-10-CM

## 2013-06-13 DIAGNOSIS — I4891 Unspecified atrial fibrillation: Secondary | ICD-10-CM

## 2013-06-13 LAB — POCT INR: INR: 2.5

## 2013-06-26 ENCOUNTER — Encounter: Payer: Self-pay | Admitting: *Deleted

## 2013-07-02 ENCOUNTER — Encounter: Payer: Self-pay | Admitting: Internal Medicine

## 2013-07-05 ENCOUNTER — Other Ambulatory Visit: Payer: Self-pay

## 2013-07-05 MED ORDER — ATENOLOL 100 MG PO TABS: 100.0000 mg | ORAL_TABLET | Freq: Every day | ORAL | Status: DC

## 2013-07-12 ENCOUNTER — Ambulatory Visit (INDEPENDENT_AMBULATORY_CARE_PROVIDER_SITE_OTHER): Payer: Medicare Other | Admitting: *Deleted

## 2013-07-12 DIAGNOSIS — Z5181 Encounter for therapeutic drug level monitoring: Secondary | ICD-10-CM

## 2013-07-12 DIAGNOSIS — Z7901 Long term (current) use of anticoagulants: Secondary | ICD-10-CM

## 2013-07-12 DIAGNOSIS — I4891 Unspecified atrial fibrillation: Secondary | ICD-10-CM

## 2013-07-12 LAB — POCT INR: INR: 3.9

## 2013-07-26 ENCOUNTER — Other Ambulatory Visit: Payer: Self-pay

## 2013-07-26 DIAGNOSIS — Z4502 Encounter for adjustment and management of automatic implantable cardiac defibrillator: Secondary | ICD-10-CM

## 2013-07-26 DIAGNOSIS — I472 Ventricular tachycardia: Secondary | ICD-10-CM

## 2013-07-26 DIAGNOSIS — I4729 Other ventricular tachycardia: Secondary | ICD-10-CM

## 2013-07-26 DIAGNOSIS — I255 Ischemic cardiomyopathy: Secondary | ICD-10-CM

## 2013-07-26 DIAGNOSIS — R55 Syncope and collapse: Secondary | ICD-10-CM

## 2013-07-26 DIAGNOSIS — I5042 Chronic combined systolic (congestive) and diastolic (congestive) heart failure: Secondary | ICD-10-CM

## 2013-07-26 DIAGNOSIS — I4891 Unspecified atrial fibrillation: Secondary | ICD-10-CM

## 2013-07-26 MED ORDER — AMIODARONE HCL 200 MG PO TABS: 200.0000 mg | ORAL_TABLET | Freq: Every day | ORAL | Status: DC

## 2013-07-30 ENCOUNTER — Ambulatory Visit (INDEPENDENT_AMBULATORY_CARE_PROVIDER_SITE_OTHER): Payer: Medicare Other | Admitting: Pharmacist

## 2013-07-30 DIAGNOSIS — Z5181 Encounter for therapeutic drug level monitoring: Secondary | ICD-10-CM

## 2013-07-30 DIAGNOSIS — Z7901 Long term (current) use of anticoagulants: Secondary | ICD-10-CM

## 2013-07-30 DIAGNOSIS — I4891 Unspecified atrial fibrillation: Secondary | ICD-10-CM

## 2013-07-30 LAB — POCT INR: INR: 2.7

## 2013-08-20 ENCOUNTER — Ambulatory Visit (INDEPENDENT_AMBULATORY_CARE_PROVIDER_SITE_OTHER): Payer: Medicare Other

## 2013-08-20 DIAGNOSIS — Z7901 Long term (current) use of anticoagulants: Secondary | ICD-10-CM

## 2013-08-20 DIAGNOSIS — Z5181 Encounter for therapeutic drug level monitoring: Secondary | ICD-10-CM

## 2013-08-20 DIAGNOSIS — I4891 Unspecified atrial fibrillation: Secondary | ICD-10-CM

## 2013-08-20 LAB — POCT INR: INR: 2.8

## 2013-08-29 ENCOUNTER — Encounter: Payer: Self-pay | Admitting: Internal Medicine

## 2013-08-29 ENCOUNTER — Ambulatory Visit (INDEPENDENT_AMBULATORY_CARE_PROVIDER_SITE_OTHER): Payer: Medicare Other | Admitting: *Deleted

## 2013-08-29 DIAGNOSIS — I509 Heart failure, unspecified: Secondary | ICD-10-CM

## 2013-08-29 DIAGNOSIS — Z95810 Presence of automatic (implantable) cardiac defibrillator: Secondary | ICD-10-CM

## 2013-08-29 DIAGNOSIS — I5042 Chronic combined systolic (congestive) and diastolic (congestive) heart failure: Secondary | ICD-10-CM

## 2013-08-29 LAB — MDC_IDC_ENUM_SESS_TYPE_REMOTE
Battery Remaining Longevity: 1 mo
Brady Statistic AP VS Percent: 91 %
Brady Statistic AS VP Percent: 1 %
Brady Statistic AS VS Percent: 1 %
Brady Statistic RV Percent Paced: 9.3 %
Date Time Interrogation Session: 20150409062536
HighPow Impedance: 41 Ohm
Implantable Pulse Generator Serial Number: 430236
Lead Channel Impedance Value: 460 Ohm
Lead Channel Pacing Threshold Amplitude: 0.75 V
Lead Channel Pacing Threshold Amplitude: 0.75 V
Lead Channel Pacing Threshold Pulse Width: 0.5 ms
Lead Channel Sensing Intrinsic Amplitude: 11.9 mV
Lead Channel Setting Pacing Amplitude: 2 V
Lead Channel Setting Pacing Pulse Width: 0.5 ms
Lead Channel Setting Sensing Sensitivity: 0.3 mV
MDC IDC MSMT BATTERY VOLTAGE: 2.44 V
MDC IDC MSMT LEADCHNL RA PACING THRESHOLD PULSEWIDTH: 0.5 ms
MDC IDC MSMT LEADCHNL RA SENSING INTR AMPL: 1.8 mV
MDC IDC MSMT LEADCHNL RV IMPEDANCE VALUE: 350 Ohm
MDC IDC SET LEADCHNL RV PACING AMPLITUDE: 2.5 V
MDC IDC SET ZONE DETECTION INTERVAL: 475 ms
MDC IDC STAT BRADY AP VP PERCENT: 9.1 %
MDC IDC STAT BRADY RA PERCENT PACED: 99 %
Zone Setting Detection Interval: 270 ms

## 2013-08-31 ENCOUNTER — Encounter: Payer: Self-pay | Admitting: Internal Medicine

## 2013-09-02 ENCOUNTER — Telehealth: Payer: Self-pay | Admitting: Internal Medicine

## 2013-09-02 NOTE — Telephone Encounter (Signed)
Pt received vibratory alert due to ERI on 08/30/13. ROV w/ device clinic to turn off notifer 09/03/13 @9 :00. ROV w/ BE for changeout discussion 09/26/13.

## 2013-09-02 NOTE — Telephone Encounter (Signed)
New message    defibulator vibrated over the weekend.  It did not shock him----just vibrated.

## 2013-09-03 ENCOUNTER — Ambulatory Visit (INDEPENDENT_AMBULATORY_CARE_PROVIDER_SITE_OTHER): Payer: Medicare Other | Admitting: *Deleted

## 2013-09-03 DIAGNOSIS — Z4502 Encounter for adjustment and management of automatic implantable cardiac defibrillator: Secondary | ICD-10-CM

## 2013-09-03 LAB — MDC_IDC_ENUM_SESS_TYPE_INCLINIC
Battery Remaining Longevity: 0 mo
Battery Voltage: 2.41 V
Brady Statistic RV Percent Paced: 9.6 %
Date Time Interrogation Session: 20150414090903
HIGH POWER IMPEDANCE MEASURED VALUE: 36 Ohm
Implantable Pulse Generator Serial Number: 430236
Lead Channel Impedance Value: 325 Ohm
Lead Channel Sensing Intrinsic Amplitude: 1.8 mV
Lead Channel Sensing Intrinsic Amplitude: 9.2 mV
Lead Channel Setting Pacing Amplitude: 2.5 V
Lead Channel Setting Sensing Sensitivity: 0.3 mV
MDC IDC MSMT LEADCHNL RA IMPEDANCE VALUE: 437.5 Ohm
MDC IDC SET LEADCHNL RA PACING AMPLITUDE: 2 V
MDC IDC SET LEADCHNL RV PACING PULSEWIDTH: 0.5 ms
MDC IDC STAT BRADY RA PERCENT PACED: 99 %
Zone Setting Detection Interval: 270 ms
Zone Setting Detection Interval: 475 ms

## 2013-09-03 NOTE — Progress Notes (Signed)
Brought pt in to turn off ERI notifier. Device alerted pt x2 then turned off notifier automatically. ROV w/ Brooke 09/26/13 @10 :00 for changeout discussion.

## 2013-09-17 ENCOUNTER — Ambulatory Visit (INDEPENDENT_AMBULATORY_CARE_PROVIDER_SITE_OTHER): Payer: Medicare Other

## 2013-09-17 DIAGNOSIS — Z5181 Encounter for therapeutic drug level monitoring: Secondary | ICD-10-CM

## 2013-09-17 DIAGNOSIS — I4891 Unspecified atrial fibrillation: Secondary | ICD-10-CM

## 2013-09-17 DIAGNOSIS — Z7901 Long term (current) use of anticoagulants: Secondary | ICD-10-CM

## 2013-09-17 LAB — POCT INR: INR: 3.5

## 2013-09-19 ENCOUNTER — Emergency Department (HOSPITAL_COMMUNITY)
Admission: EM | Admit: 2013-09-19 | Discharge: 2013-09-19 | Disposition: A | Discharge status: Home / Self Care | Payer: Medicare Other | Attending: Emergency Medicine | Admitting: Emergency Medicine

## 2013-09-19 ENCOUNTER — Encounter (HOSPITAL_COMMUNITY): Payer: Self-pay | Admitting: Emergency Medicine

## 2013-09-19 DIAGNOSIS — H109 Unspecified conjunctivitis: Secondary | ICD-10-CM | POA: Insufficient documentation

## 2013-09-19 DIAGNOSIS — H5789 Other specified disorders of eye and adnexa: Secondary | ICD-10-CM | POA: Insufficient documentation

## 2013-09-19 DIAGNOSIS — Z79899 Other long term (current) drug therapy: Secondary | ICD-10-CM | POA: Insufficient documentation

## 2013-09-19 DIAGNOSIS — I10 Essential (primary) hypertension: Secondary | ICD-10-CM | POA: Insufficient documentation

## 2013-09-19 DIAGNOSIS — H9220 Otorrhagia, unspecified ear: Secondary | ICD-10-CM

## 2013-09-19 DIAGNOSIS — Z95810 Presence of automatic (implantable) cardiac defibrillator: Secondary | ICD-10-CM | POA: Insufficient documentation

## 2013-09-19 DIAGNOSIS — H579 Unspecified disorder of eye and adnexa: Secondary | ICD-10-CM | POA: Insufficient documentation

## 2013-09-19 DIAGNOSIS — I2510 Atherosclerotic heart disease of native coronary artery without angina pectoris: Secondary | ICD-10-CM | POA: Insufficient documentation

## 2013-09-19 DIAGNOSIS — Z951 Presence of aortocoronary bypass graft: Secondary | ICD-10-CM | POA: Insufficient documentation

## 2013-09-19 DIAGNOSIS — Z86718 Personal history of other venous thrombosis and embolism: Secondary | ICD-10-CM | POA: Insufficient documentation

## 2013-09-19 DIAGNOSIS — E785 Hyperlipidemia, unspecified: Secondary | ICD-10-CM | POA: Insufficient documentation

## 2013-09-19 DIAGNOSIS — H6120 Impacted cerumen, unspecified ear: Secondary | ICD-10-CM | POA: Insufficient documentation

## 2013-09-19 DIAGNOSIS — Z87891 Personal history of nicotine dependence: Secondary | ICD-10-CM | POA: Insufficient documentation

## 2013-09-19 DIAGNOSIS — Z7901 Long term (current) use of anticoagulants: Secondary | ICD-10-CM | POA: Insufficient documentation

## 2013-09-19 DIAGNOSIS — H9210 Otorrhea, unspecified ear: Secondary | ICD-10-CM | POA: Insufficient documentation

## 2013-09-19 DIAGNOSIS — I4891 Unspecified atrial fibrillation: Secondary | ICD-10-CM | POA: Insufficient documentation

## 2013-09-19 MED ORDER — CIPROFLOXACIN HCL 0.3 % OP SOLN
1.0000 [drp] | OPHTHALMIC | Status: DC
  Administered 2013-09-19: 1 [drp] via OPHTHALMIC
  Filled 2013-09-19: qty 2.5

## 2013-09-19 NOTE — ED Notes (Signed)
ENT cart at the bedside.  

## 2013-09-19 NOTE — ED Notes (Addendum)
Pt reports that he woke up out of sleep with sharp R ear pain and bleeding. Pt reports having allergies the past few days with productive cough. Eyes appear red. Pt unable to hear out of R ear now. Pt denies headache.

## 2013-09-19 NOTE — ED Provider Notes (Signed)
CSN: [id number]     Arrival date & time 09/19/13  0234 History   First MD Initiated Contact with Patient 09/19/13 0404     Chief Complaint  Patient presents with  . Ear Drainage     (Consider location/radiation/quality/duration/timing/severity/associated sxs/prior Treatment) HPI Comments: Pt is 80 years old, presents with right ear pain which awoke him from sleep. He states that initially he felt a gurgling sensation in the right ear followed by a feeling of liquid coming from the ear and acute onset of pain. The pain then eased off and now he just has a feeling of blood in his ear. He has decreased hearing in the right ear, he endorses using a Q-tip to clean his ear is frequently. He denies any nasal symptoms, no sore throat, no difficulty breathing. He is on Coumadin. Additionally the patient complains of bilateral eye redness and drainage with associated itching but no sneezing or coughing  Patient is a 80 y.o. male presenting with ear drainage. The history is provided by the patient.  Ear Drainage    Past Medical History  Diagnosis Date  . DVT (deep venous thrombosis)   . Ischemic cardiomyopathy     CABG vx4 1986 and PTCA in 1991; stent 1997 cath 2008  . Dyslipidemia   . HTN (hypertension)   . Atrial fibrillation     On Tikosyn  . Ventricular tachycardia     With prior shock therapy and antitachycardia pacing  . Automatic implantable cardiac defibrillator St Judes 3/08    St. Jude  . History of colonoscopy 10/14/2002  . Coronary artery disease   . Fatigue 03/29/2012   Past Surgical History  Procedure Laterality Date  . Cardiac defibrillator placement  3/08    St. Jude  . Ptca  1997  . Coronary artery bypass graft  1996    last cath reports an atretic LIMA to the LAD and patent SVG to RCA   Family History  Problem Relation Age of Onset  . Diabetes    . Heart disease Father   . Ulcers Father   . Hyperlipidemia Father   . Hypertension Father   . Cancer Brother    lung cancer  . Cancer Brother     throat cancer   History  Substance Use Topics  . Smoking status: Former Smoker    Types: Cigars  . Smokeless tobacco: Never Used     Comment: quit smoking cigerettes in 1996  . Alcohol Use: 10.5 oz/week    21 drink(s) per week     Comment: 2 glasses of wine daily    Review of Systems  Constitutional: Negative for fever.  HENT: Positive for ear pain. Negative for congestion, nosebleeds, rhinorrhea and sneezing.   Eyes: Positive for pain, redness and itching.  Respiratory: Negative for cough.       Allergies  Review of patient's allergies indicates no known allergies.  Home Medications   Prior to Admission medications   Medication Sig Start Date End Date Taking? Authorizing Provider  amiodarone (PACERONE) 200 MG tablet Take 1 tablet (200 mg total) by mouth daily. 07/26/13  Yes Vesta Mixer, MD  atenolol (TENORMIN) 100 MG tablet Take 1 tablet (100 mg total) by mouth daily. 07/05/13  Yes Duke Salvia, MD  benzocaine-menthol (CHLORAEPTIC) 6-10 MG lozenge Take 1 lozenge by mouth as needed for sore throat.   Yes Historical Provider, MD  clobetasol cream (TEMOVATE) 0.05 % Apply 1 application topically daily.  11/23/11  Yes Historical Provider,  MD  digoxin (LANOXIN) 0.125 MG tablet Take 0.125 mg by mouth every other day.   Yes Historical Provider, MD  fish oil-omega-3 fatty acids 1000 MG capsule Take 1 g by mouth 2 (two) times daily.    Yes Historical Provider, MD  Multiple Vitamin (MULTIVITAMIN) tablet Take 1 tablet by mouth daily.    Yes Historical Provider, MD  ramipril (ALTACE) 5 MG capsule Take 5 mg by mouth daily.   Yes Historical Provider, MD  rosuvastatin (CRESTOR) 40 MG tablet Take 1 tablet (40 mg total) by mouth daily. STOP SIMVASTATIN 05/31/13  Yes Duke SalviaSteven C Klein, MD  warfarin (COUMADIN) 2 MG tablet Take 2 mg by mouth daily. Take 1 tablet daily as directed by coumadin clinic   Yes Historical Provider, MD  nitroGLYCERIN (NITROSTAT) 0.4 MG SL  tablet Place 0.4 mg under the tongue every 5 (five) minutes as needed for chest pain.     Historical Provider, MD   BP 121/60  Pulse 60  Temp(Src) 98.1 F (36.7 C)  Resp 18  Ht 5\' 10"  (1.778 m)  Wt 210 lb (95.255 kg)  BMI 30.13 kg/m2  SpO2 99% Physical Exam  Nursing note and vitals reviewed. Constitutional: He appears well-developed and well-nourished. No distress.  HENT:  Head: Normocephalic and atraumatic.  Mouth/Throat: Oropharynx is clear and moist. No oropharyngeal exudate.  Right ear shows cerumen impaction, small amount of blood coming from around the cerumen, nasal passages clear, oropharynx clear, left tympanic membrane normal  Eyes: EOM are normal. Pupils are equal, round, and reactive to light. Right eye exhibits discharge. Left eye exhibits discharge. No scleral icterus.  Bilateral conjunctival injection, and crusting discharge bilaterally  Neck: Normal range of motion. Neck supple. No JVD present. No thyromegaly present.  Cardiovascular: Normal rate, regular rhythm, normal heart sounds and intact distal pulses.  Exam reveals no gallop and no friction rub.   No murmur heard. Pulmonary/Chest: Effort normal and breath sounds normal. No respiratory distress. He has no wheezes. He has no rales.  Abdominal: Soft. Bowel sounds are normal. He exhibits no distension and no mass. There is no tenderness.  Musculoskeletal: Normal range of motion. He exhibits no edema and no tenderness.  Lymphadenopathy:    He has no cervical adenopathy.  Neurological: He is alert. Coordination normal.  Skin: Skin is warm and dry. No rash noted. No erythema.  Psychiatric: He has a normal mood and affect. His behavior is normal.    ED Course  Procedures (including critical care time) Labs Review Labs Reviewed - No data to display  Imaging Review No results found.    MDM   Final diagnoses:  Blood in ear canal  Conjunctivitis    The patient appears to have bilateral conjunctivitis, he  also had some blood from his right ear, cerumen will need to be removed and then further inspection given.  Cerumen removed, blood absrobed with Q tip - no signs of bleeding, no masses, no FB's.  TM on the R is cloudy but no d/c, no perforation.    Vida RollerBrian D Simara Rhyner, MD 09/19/13 [phone]

## 2013-09-25 ENCOUNTER — Encounter: Payer: Self-pay | Admitting: Cardiology

## 2013-09-25 NOTE — Progress Notes (Signed)
ELECTROPHYSIOLOGY OFFICE NOTE   Patient ID: [name] PolingKenneth L Drudge MRN: [id number], DOB/AGE: 78-Dec-1935   Date of Visit: 09/26/2013  Primary Physician: Sanda Lingerhomas Jones, MD Primary Cardiologist: previously [name] KillStuckey, MD Primary EP: Graciela HusbandsKlein, MD Reason for Visit: EP/device follow-up  History of Present Illness  [name] is a 80 y.o. male with an ischemic CM s/p ICD implantation, PMVT and monomorphic VT s/p appropriate ICD shocks, PAF and CAD s/p CABG who presents today for routine EP follow-up. His ICD battery has reached ERI.  Since last being seen in our clinic, he reports he is doing well and has no complaints. He is quite active. He denies chest pain or shortness of breath. He denies palpitations, dizziness, near syncope or syncope. He denies LE swelling, orthopnea, PND or recent weight gain. No ICD shocks. He is compliant and tolerating medications without difficulty.     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 He underwent Myoview scanning which demonstrated no perfusion defects. It was elected to treat this medically. Ejection fraction was 48%.   Past Medical History Past Medical History  Diagnosis Date  . DVT (deep venous thrombosis)   . Ischemic cardiomyopathy     CABG vx4 1986 and PTCA in 1991; stent 1997 cath 2008  . Dyslipidemia   . HTN (hypertension)   . Atrial fibrillation   . Ventricular tachycardia     With prior shock therapy and antitachycardia pacing  . Automatic implantable cardiac defibrillator St Judes 3/08    St. Jude  . History of colonoscopy 10/14/2002  . Coronary artery disease   . Fatigue 03/29/2012    Past Surgical History Past Surgical History  Procedure Laterality Date  . Cardiac defibrillator placement  3/08    St. Jude  . Ptca  1997  . Coronary artery bypass graft  1996    last cath reports an atretic LIMA to the LAD and patent SVG to RCA      Allergies/Intolerances No Known Allergies  Current Home Medications Current Outpatient Prescriptions  Medication Sig Dispense Refill  . amiodarone (PACERONE) 200 MG tablet Take 1 tablet (200 mg total) by mouth daily.  90 tablet  1  . atenolol (TENORMIN) 100 MG tablet Take 1 tablet (100 mg total) by mouth daily.  90 tablet  1  . benzocaine-menthol (CHLORAEPTIC) 6-10 MG lozenge Take 1 lozenge by mouth as needed for sore throat.      . clobetasol cream (TEMOVATE) 0.05 % Apply 1 application topically daily.       . digoxin (LANOXIN) 0.125 MG tablet Take 0.125 mg by mouth every other day.      . fish oil-omega-3 fatty acids 1000 MG capsule Take 1 g by mouth 2 (two) times daily.       . Multiple Vitamin (MULTIVITAMIN) tablet Take 1 tablet by mouth daily.       . nitroGLYCERIN (NITROSTAT) 0.4 MG SL tablet Place 0.4 mg under the tongue every 5 (five) minutes as needed for chest pain.       . ramipril (ALTACE) 5 MG capsule Take 5 mg by mouth daily.      . rosuvastatin (CRESTOR) 40 MG tablet Take 1 tablet (40 mg total) by mouth daily. STOP SIMVASTATIN  90 tablet  3  . warfarin (COUMADIN) 2 MG tablet Take 2 mg by mouth daily. Take 1 tablet daily as directed by coumadin clinic  No current facility-administered medications for this visit.    Social History History   Social History  . Marital Status: Married    Spouse Name: N/A    Number of Children: 4  . Years of Education: N/A   Occupational History  . Employed in Airline pilot in the [employer]    Social History Main Topics  . Smoking status: Former Smoker    Types: Cigars  . Smokeless tobacco: Never Used     Comment: quit smoking cigerettes in 1996  . Alcohol Use: 10.5 oz/week    21 drink(s) per week     Comment: 2 glasses of wine daily  . Drug Use: No  . Sexual Activity: Not Currently   Other Topics Concern  . Not on file   Social History Narrative   HSG, College in '75 x 2 years. Married '57 - 26yr/divorced; married '69- 7  yrs/divorced; married '79 - 85yrs/divorced; '96 -. 3 boys - '58, '59, '60; 1 girl - '61; 9 grandchildren, 4 great-grands. 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 ACP - yes CPR, no long term mechanical ventilation; no heroic measures in the face of irreversible disease or disability.     Review of Systems General: No chills, fever, night sweats or weight changes Cardiovascular: No chest pain, dyspnea on exertion, edema, orthopnea, palpitations, paroxysmal nocturnal dyspnea Dermatological: No rash, lesions or masses Respiratory: No cough, dyspnea Urologic: No hematuria, dysuria Abdominal: No nausea, vomiting, diarrhea, bright red blood per rectum, melena, or hematemesis Neurologic: No visual changes, weakness, changes in mental status All other systems reviewed and are otherwise negative except as noted above.  Physical Exam Vitals: Blood pressure 110/66, pulse 76, weight 217 lb (98.431 kg).  General: Well developed, well appearing 80 y.o. male in no acute distress. HEENT: Normocephalic, atraumatic. EOMs intact. Sclera nonicteric. Oropharynx clear.  Neck: Supple. No JVD. Lungs: Respirations regular and unlabored, CTA bilaterally. No wheezes, rales or rhonchi. Heart: RRR. S1, S2 present. No murmurs, rub, S3 or S4. Abdomen: Soft, non-tender, non-distended. BS present x 4 quadrants.  Extremities: No clubbing, cyanosis or edema. PT/Radials 2+ and equal bilaterally. Psych: Normal affect. Neuro: Alert and oriented X 3. Moves all extremities spontaneously.   Diagnostics  Echocardiogram most recent from May 2012 Study Conclusions - Left ventricle: The cavity size was mildly dilated. Wall thickness was normal. Systolic function was mildly to moderately reduced. The estimated ejection fraction was in the range of 40% to 45%. Diffuse hypokinesis. Doppler parameters are consistent with abnormal left ventricular relaxation (grade 1 diastolic dysfunction). - Ventricular septum: Septal  motion showed dyssynergy. These changes are consistent with a left bundle branch block. - Mitral valve: Mild regurgitation. - Left atrium: The atrium was mildly dilated. - Right ventricle: The cavity size was mildly dilated. - Right atrium: The atrium was mildly dilated.  12-lead ECG today - A paced V sensed at 76 bpm, LBBB; PR 200, QRS 190, QTc 504 Device interrogation today - Battery reached ERI 08/30/2013 otherwise normal device function. Thresholds and sensing consistent with previous device measurements. Impedance trends stable over time. No evidence of any ventricular arrhythmias. 1 mode switch episode, 12 seconds in duration, EGM shows atrial tachycardia. Histogram distribution appropriate for patient and level of activity. Device programmed at appropriate safety margins. Device programmed to optimize intrinsic conduction. Patient education completed including shock plan. Scheduled for ICD generator change with Dr. Graciela Husbands on 10/07/2013.  Assessment and Plan 1. ICD battery at [facility] 2. Polymorphic and monomorphic VT s/p  appropriate ICD shocks 3. Ischemic cardiomyopathy 4. Chronic systolic HF 5. Coronary artery disease  Mr. Jeannette ICD battery reached ERI on 08/30/2013. Otherwise normal device function with no programming changes made. We discussed need for ICD generator change, including procedure risks and benefits. Risks, benefits and alternatives to ICD generator change were reviewed in detail. These risks include, but are not limited to, lead dislodgement, bleeding and infection. Mr. Edholm expressed verbal understanding and agrees to proceed. This will be scheduled with Dr. Graciela Husbands at the next available time. As required for NCDR ICD registry, will update echo. Of note, he has known LBBB with QRS duration of 190 msec; however, he is V paced only 9% of the time and he has minimal CHF symptoms and may not meet criteria for CRT at this time. Will confer with Dr. Graciela Husbands but have tentatively scheduled  ICD generator change for 10/07/2013.  Jorja Loa, PA-C 09/27/2013, 11:20 PM

## 2013-09-26 ENCOUNTER — Encounter: Payer: Self-pay | Admitting: Internal Medicine

## 2013-09-26 ENCOUNTER — Ambulatory Visit (INDEPENDENT_AMBULATORY_CARE_PROVIDER_SITE_OTHER): Payer: Medicare Other | Admitting: Cardiovascular Disease

## 2013-09-26 ENCOUNTER — Encounter: Payer: Self-pay | Admitting: Cardiology

## 2013-09-26 ENCOUNTER — Ambulatory Visit (INDEPENDENT_AMBULATORY_CARE_PROVIDER_SITE_OTHER): Payer: Medicare Other | Admitting: Cardiology

## 2013-09-26 ENCOUNTER — Encounter: Payer: Self-pay | Admitting: *Deleted

## 2013-09-26 VITALS — BP 110/66 | HR 76 | Wt 217.0 lb

## 2013-09-26 DIAGNOSIS — I255 Ischemic cardiomyopathy: Secondary | ICD-10-CM

## 2013-09-26 DIAGNOSIS — Z95810 Presence of automatic (implantable) cardiac defibrillator: Secondary | ICD-10-CM

## 2013-09-26 DIAGNOSIS — I4891 Unspecified atrial fibrillation: Secondary | ICD-10-CM

## 2013-09-26 DIAGNOSIS — Z4502 Encounter for adjustment and management of automatic implantable cardiac defibrillator: Secondary | ICD-10-CM

## 2013-09-26 DIAGNOSIS — I472 Ventricular tachycardia: Secondary | ICD-10-CM

## 2013-09-26 DIAGNOSIS — Z5181 Encounter for therapeutic drug level monitoring: Secondary | ICD-10-CM

## 2013-09-26 DIAGNOSIS — I2589 Other forms of chronic ischemic heart disease: Secondary | ICD-10-CM

## 2013-09-26 DIAGNOSIS — I4729 Other ventricular tachycardia: Secondary | ICD-10-CM

## 2013-09-26 LAB — PROTIME-INR
INR: 4.2 ratio — ABNORMAL HIGH (ref 0.8–1.0)
Prothrombin Time: 44.6 s — ABNORMAL HIGH (ref 9.6–13.1)

## 2013-09-26 LAB — MDC_IDC_ENUM_SESS_TYPE_INCLINIC
Brady Statistic RA Percent Paced: 99 %
Date Time Interrogation Session: 20150507112832
HighPow Impedance: 39.375
Lead Channel Pacing Threshold Amplitude: 1.25 V
Lead Channel Pacing Threshold Amplitude: 1.5 V
Lead Channel Pacing Threshold Pulse Width: 0.5 ms
Lead Channel Pacing Threshold Pulse Width: 0.5 ms
Lead Channel Pacing Threshold Pulse Width: 0.8 ms
Lead Channel Setting Pacing Amplitude: 2 V
Lead Channel Setting Pacing Amplitude: 2.5 V
Lead Channel Setting Sensing Sensitivity: 0.3 mV
MDC IDC MSMT BATTERY REMAINING LONGEVITY: 0 mo
MDC IDC MSMT BATTERY VOLTAGE: 2.39 V
MDC IDC MSMT LEADCHNL RA IMPEDANCE VALUE: 480 Ohm
MDC IDC MSMT LEADCHNL RA PACING THRESHOLD AMPLITUDE: 0.75 V
MDC IDC MSMT LEADCHNL RV IMPEDANCE VALUE: 360 Ohm
MDC IDC MSMT LEADCHNL RV SENSING INTR AMPL: 11.5 mV
MDC IDC PG SERIAL: 430236
MDC IDC SET LEADCHNL RV PACING PULSEWIDTH: 0.8 ms
MDC IDC SET ZONE DETECTION INTERVAL: 270 ms
MDC IDC STAT BRADY RV PERCENT PACED: 9.5 %
Zone Setting Detection Interval: 475 ms

## 2013-09-26 LAB — CBC WITH DIFFERENTIAL/PLATELET
Basophils Absolute: 0 10*3/uL (ref 0.0–0.1)
Basophils Relative: 0.2 % (ref 0.0–3.0)
Eosinophils Absolute: 0.1 10*3/uL (ref 0.0–0.7)
Eosinophils Relative: 1.1 % (ref 0.0–5.0)
HCT: 37 % — ABNORMAL LOW (ref 39.0–52.0)
HEMOGLOBIN: 12.5 g/dL — AB (ref 13.0–17.0)
LYMPHS ABS: 0.7 10*3/uL (ref 0.7–4.0)
Lymphocytes Relative: 8.4 % — ABNORMAL LOW (ref 12.0–46.0)
MCHC: 33.7 g/dL (ref 30.0–36.0)
MCV: 92.2 fl (ref 78.0–100.0)
MONOS PCT: 5.5 % (ref 3.0–12.0)
Monocytes Absolute: 0.5 10*3/uL (ref 0.1–1.0)
NEUTROS ABS: 7.2 10*3/uL (ref 1.4–7.7)
Neutrophils Relative %: 84.8 % — ABNORMAL HIGH (ref 43.0–77.0)
Platelets: 231 10*3/uL (ref 150.0–400.0)
RBC: 4.01 Mil/uL — ABNORMAL LOW (ref 4.22–5.81)
RDW: 13.7 % (ref 11.5–15.5)
WBC: 8.5 10*3/uL (ref 4.0–10.5)

## 2013-09-26 LAB — BASIC METABOLIC PANEL
BUN: 22 mg/dL (ref 6–23)
CO2: 30 mEq/L (ref 19–32)
CREATININE: 1.4 mg/dL (ref 0.4–1.5)
Calcium: 9.1 mg/dL (ref 8.4–10.5)
Chloride: 102 mEq/L (ref 96–112)
GFR: 53.89 mL/min — AB (ref >60.00)
GLUCOSE: 98 mg/dL (ref 70–99)
POTASSIUM: 4.2 meq/L (ref 3.5–5.1)
Sodium: 138 mEq/L (ref 135–145)

## 2013-09-26 NOTE — Patient Instructions (Addendum)
Your physician recommends that you return for lab work today: [facility]  Your Physician recommends you have a Generator change 10/07/13 with Dr. Marikay Alar at 4:00pm. It is recommended that you replace a pacemaker generator that is at the end of its service life. The remaining lifespan of a pacemaker is determined during visits to the Pacemaker Clinic. The battery in a pacemaker does not stop suddenly but rather loses its charge slowly, which lets the cardiologist plan the replacement date. Duration This procedure takes less than one hour. The total hospital stay is approximately 24 to 48 hours.   Your physician has requested that you have an echocardiogram. Echocardiography is a painless test that uses sound waves to create images of your heart. It provides your doctor with information about the size and shape of your heart and how well your heart's chambers and valves are working. This procedure takes approximately one hour. There are no restrictions for this procedure.

## 2013-09-27 ENCOUNTER — Encounter: Payer: Self-pay | Admitting: Internal Medicine

## 2013-09-30 ENCOUNTER — Encounter (HOSPITAL_COMMUNITY): Payer: Self-pay | Admitting: Pharmacy Technician

## 2013-09-30 ENCOUNTER — Ambulatory Visit (INDEPENDENT_AMBULATORY_CARE_PROVIDER_SITE_OTHER): Payer: Medicare Other | Admitting: Internal Medicine

## 2013-09-30 ENCOUNTER — Ambulatory Visit (HOSPITAL_COMMUNITY)
Admission: RE | Admit: 2013-09-30 | Discharge: 2013-09-30 | Disposition: A | Discharge status: Home / Self Care | Payer: Medicare Other | Source: Ambulatory Visit | Attending: Cardiology | Admitting: Cardiology

## 2013-09-30 ENCOUNTER — Other Ambulatory Visit (INDEPENDENT_AMBULATORY_CARE_PROVIDER_SITE_OTHER): Payer: Medicare Other

## 2013-09-30 ENCOUNTER — Encounter: Payer: Self-pay | Admitting: Internal Medicine

## 2013-09-30 VITALS — BP 124/70 | HR 60 | Temp 97.2°F | Resp 16 | Ht 70.0 in | Wt 218.0 lb

## 2013-09-30 DIAGNOSIS — I82409 Acute embolism and thrombosis of unspecified deep veins of unspecified lower extremity: Secondary | ICD-10-CM

## 2013-09-30 DIAGNOSIS — I10 Essential (primary) hypertension: Secondary | ICD-10-CM

## 2013-09-30 DIAGNOSIS — Z4502 Encounter for adjustment and management of automatic implantable cardiac defibrillator: Secondary | ICD-10-CM

## 2013-09-30 DIAGNOSIS — D510 Vitamin B12 deficiency anemia due to intrinsic factor deficiency: Secondary | ICD-10-CM

## 2013-09-30 DIAGNOSIS — I255 Ischemic cardiomyopathy: Secondary | ICD-10-CM

## 2013-09-30 DIAGNOSIS — E785 Hyperlipidemia, unspecified: Secondary | ICD-10-CM

## 2013-09-30 DIAGNOSIS — Z95810 Presence of automatic (implantable) cardiac defibrillator: Secondary | ICD-10-CM

## 2013-09-30 DIAGNOSIS — Z23 Encounter for immunization: Secondary | ICD-10-CM

## 2013-09-30 DIAGNOSIS — I472 Ventricular tachycardia, unspecified: Secondary | ICD-10-CM

## 2013-09-30 DIAGNOSIS — I4729 Other ventricular tachycardia: Secondary | ICD-10-CM

## 2013-09-30 DIAGNOSIS — Z0000 Encounter for general adult medical examination without abnormal findings: Secondary | ICD-10-CM

## 2013-09-30 DIAGNOSIS — I059 Rheumatic mitral valve disease, unspecified: Secondary | ICD-10-CM

## 2013-09-30 DIAGNOSIS — I4891 Unspecified atrial fibrillation: Secondary | ICD-10-CM

## 2013-09-30 LAB — LIPID PANEL
CHOL/HDL RATIO: 2
Cholesterol: 110 mg/dL (ref 0–200)
HDL: 49.5 mg/dL (ref >39.00)
LDL CALC: 45 mg/dL (ref 0–99)
Triglycerides: 79 mg/dL (ref 0.0–149.0)
VLDL: 15.8 mg/dL (ref 0.0–40.0)

## 2013-09-30 LAB — CBC WITH DIFFERENTIAL/PLATELET
BASOS ABS: 0 10*3/uL (ref 0.0–0.1)
Basophils Relative: 0.4 % (ref 0.0–3.0)
EOS ABS: 0.1 10*3/uL (ref 0.0–0.7)
Eosinophils Relative: 2.1 % (ref 0.0–5.0)
HCT: 36.3 % — ABNORMAL LOW (ref 39.0–52.0)
HEMOGLOBIN: 12.1 g/dL — AB (ref 13.0–17.0)
LYMPHS PCT: 10.2 % — AB (ref 12.0–46.0)
Lymphs Abs: 0.7 10*3/uL (ref 0.7–4.0)
MCHC: 33.4 g/dL (ref 30.0–36.0)
MCV: 92.3 fl (ref 78.0–100.0)
MONOS PCT: 5.8 % (ref 3.0–12.0)
Monocytes Absolute: 0.4 10*3/uL (ref 0.1–1.0)
Neutro Abs: 5.5 10*3/uL (ref 1.4–7.7)
Neutrophils Relative %: 81.5 % — ABNORMAL HIGH (ref 43.0–77.0)
PLATELETS: 209 10*3/uL (ref 150.0–400.0)
RBC: 3.93 Mil/uL — ABNORMAL LOW (ref 4.22–5.81)
RDW: 13.7 % (ref 11.5–15.5)
WBC: 6.7 10*3/uL (ref 4.0–10.5)

## 2013-09-30 LAB — IBC PANEL
Iron: 69 ug/dL (ref 42–165)
Saturation Ratios: 23 % (ref 20.0–50.0)
Transferrin: 214.5 mg/dL (ref 212.0–360.0)

## 2013-09-30 LAB — BASIC METABOLIC PANEL
BUN: 21 mg/dL (ref 6–23)
CHLORIDE: 103 meq/L (ref 96–112)
CO2: 28 meq/L (ref 19–32)
CREATININE: 1.2 mg/dL (ref 0.4–1.5)
Calcium: 9.2 mg/dL (ref 8.4–10.5)
GFR: 61.67 mL/min (ref >60.00)
Glucose, Bld: 89 mg/dL (ref 70–99)
POTASSIUM: 4.1 meq/L (ref 3.5–5.1)
SODIUM: 137 meq/L (ref 135–145)

## 2013-09-30 LAB — FERRITIN: Ferritin: 156.9 ng/mL (ref 22.0–322.0)

## 2013-09-30 LAB — TSH: TSH: 1.14 u[IU]/mL (ref 0.35–4.50)

## 2013-09-30 LAB — FECAL OCCULT BLOOD, GUAIAC: Fecal Occult Blood: NEGATIVE

## 2013-09-30 LAB — VITAMIN B12: VITAMIN B 12: 860 pg/mL (ref 211–911)

## 2013-09-30 LAB — FOLATE: Folate: 24.1 ng/mL (ref >5.9)

## 2013-09-30 NOTE — Assessment & Plan Note (Signed)
His BP is well controlled Today I will check his lytes and renal function 

## 2013-09-30 NOTE — Patient Instructions (Signed)
Anemia, Nonspecific Anemia is a condition in which the concentration of red blood cells or hemoglobin in the blood is below normal. Hemoglobin is a substance in red blood cells that carries oxygen to the tissues of the body. Anemia results in not enough oxygen reaching these tissues.  CAUSES  Common causes of anemia include:   Excessive bleeding. Bleeding may be internal or external. This includes excessive bleeding from periods (in women) or from the intestine.   Poor nutrition.   Chronic kidney, thyroid, and liver disease.  Bone marrow disorders that decrease red blood cell production.  Cancer and treatments for cancer.  HIV, AIDS, and their treatments.  Spleen problems that increase red blood cell destruction.  Blood disorders.  Excess destruction of red blood cells due to infection, medicines, and autoimmune disorders. SIGNS AND SYMPTOMS   Minor weakness.   Dizziness.   Headache.  Palpitations.   Shortness of breath, especially with exercise.   Paleness.  Cold sensitivity.  Indigestion.  Nausea.  Difficulty sleeping.  Difficulty concentrating. Symptoms may occur suddenly or they may develop slowly.  DIAGNOSIS  Additional blood tests are often needed. These help your health care provider determine the best treatment. Your health care provider will check your stool for blood and look for other causes of blood loss.  TREATMENT  Treatment varies depending on the cause of the anemia. Treatment can include:   Supplements of iron, vitamin B12, or folic acid.   Hormone medicines.   A blood transfusion. This may be needed if blood loss is severe.   Hospitalization. This may be needed if there is significant continual blood loss.   Dietary changes.  Spleen removal. HOME CARE INSTRUCTIONS Keep all follow-up appointments. It often takes many weeks to correct anemia, and having your health care provider check on your condition and your response to  treatment is very important. SEEK IMMEDIATE MEDICAL CARE IF:   You develop extreme weakness, shortness of breath, or chest pain.   You become dizzy or have trouble concentrating.  You develop heavy vaginal bleeding.   You develop a rash.   You have bloody or black, tarry stools.   You faint.   You vomit up blood.   You vomit repeatedly.   You have abdominal pain.  You have a fever or persistent symptoms for more than 2 3 days.   You have a fever and your symptoms suddenly get worse.   You are dehydrated.  MAKE SURE YOU:  Understand these instructions.  Will watch your condition.  Will get help right away if you are not doing well or get worse. Document Released: 06/16/2004 Document Revised: 01/09/2013 Document Reviewed: 11/02/2012 [facility] Patient Information 2014 [place], [place]. Health Maintenance, Males A healthy lifestyle and preventative care can promote health and wellness.  Maintain regular health, dental, and eye exams.  Eat a healthy diet. Foods like vegetables, fruits, whole grains, low-fat dairy products, and lean protein foods contain the nutrients you need and are low in calories. Decrease your intake of foods high in solid fats, added sugars, and salt. Get information about a proper diet from your health care provider, if necessary.  Regular physical exercise is one of the most important things you can do for your health. Most adults should get at least 150 minutes of moderate-intensity exercise (any activity that increases your heart rate and causes you to sweat) each week. In addition, most adults need muscle-strengthening exercises on 2 or more days a week.   Maintain a healthy  weight. The body mass index (BMI) is a screening tool to identify possible weight problems. It provides an estimate of body fat based on height and weight. Your health care provider can find your BMI and can help you achieve or maintain a healthy weight. For males 20  years and older:  A BMI below 18.5 is considered underweight.  A BMI of 18.5 to 24.9 is normal.  A BMI of 25 to 29.9 is considered overweight.  A BMI of 30 and above is considered obese.  Maintain normal blood lipids and cholesterol by exercising and minimizing your intake of saturated fat. Eat a balanced diet with plenty of fruits and vegetables. Blood tests for lipids and cholesterol should begin at age 78 and be repeated every 5 years. If your lipid or cholesterol levels are high, you are over 50, or you are at high risk for heart disease, you may need your cholesterol levels checked more frequently.Ongoing high lipid and cholesterol levels should be treated with medicines, if diet and exercise are not working.  If you smoke, find out from your health care provider how to quit. If you do not use tobacco, do not start.  Lung cancer screening is recommended for adults aged 78 80 years who are at high risk for developing lung cancer because of a history of smoking. A yearly low-dose CT scan of the lungs is recommended for people who have at least a 30-pack-year history of smoking and are a current smoker or have quit within the past 15 years. A pack year of smoking is smoking an average of 1 pack of cigarettes a day for 1 year (for example, a 30-pack-year history of smoking could mean smoking 1 pack a day for 30 years or 2 packs a day for 15 years). Yearly screening should continue until the smoker has stopped smoking for at least 15 years. Yearly screening should be stopped for people who develop a health problem that would prevent them from having lung cancer treatment.  If you choose to drink alcohol, do not have more than 2 drinks per day. One drink is considered to be 12 oz (360 mL) of beer, 5 oz (150 mL) of wine, or 1.5 oz (45 mL) of liquor.  Avoid use of street drugs. Do not share needles with anyone. Ask for help if you need support or instructions about stopping the use of drugs.  High  blood pressure causes heart disease and increases the risk of stroke. Blood pressure should be checked at least every 1 2 years. Ongoing high blood pressure should be treated with medicines if weight loss and exercise are not effective.  If you are 4845 78 years old, ask your health care provider if you should take aspirin to prevent heart disease.  Diabetes screening involves taking a blood sample to check your fasting blood sugar level. This should be done once every 3 years after age 78, if you are at a normal weight and without risk factors for diabetes. Testing should be considered at a younger age or be carried out more frequently if you are overweight and have at least 1 risk factor for diabetes.  Colorectal cancer can be detected and often prevented. Most routine colorectal cancer screening begins at the age of 78 and continues through age 975. However, your health care provider may recommend screening at an earlier age if you have risk factors for colon cancer. On a yearly basis, your health care provider may provide home test kits to  check for hidden blood in the stool. A small camera at the end of a tube may be used to directly examine the colon (sigmoidoscopy or colonoscopy) to detect the earliest forms of colorectal cancer. Talk to your health care provider about this at age 78, when routine screening begins. A direct exam of the colon should be repeated every 5 10 years through age 78, unless early forms of pre-cancerous polyps or small growths are found.  People who are at an increased risk for hepatitis B should be screened for this virus. You are considered at high risk for hepatitis B if:  You were born in a country where hepatitis B occurs often. Talk with your health care provider about which countries are considered high-risk.  Your parents were born in a high-risk country and you have not received a shot to protect against hepatitis B (hepatitis B vaccine).  You have HIV or  AIDS.  You use needles to inject street drugs.  You live with, or have sex with, someone who has hepatitis B.  You are a man who has sex with other men (MSM).  You get hemodialysis treatment.  You take certain medicines for conditions like cancer, organ transplantation, and autoimmune conditions.  Hepatitis C blood testing is recommended for all people born from [id number] through 1965 and any individual with known risk factors for hepatitis C.  Healthy men should no longer receive prostate-specific antigen (PSA) blood tests as part of routine cancer screening. Talk to your health care provider about prostate cancer screening.  Testicular cancer screening is not recommended for adolescents or adult males who have no symptoms. Screening includes self-exam, a health care provider exam, and other screening tests. Consult with your health care provider about any symptoms you have or any concerns you have about testicular cancer.  Practice safe sex. Use condoms and avoid high-risk sexual practices to reduce the spread of sexually transmitted infections (STIs).  Use sunscreen. Apply sunscreen liberally and repeatedly throughout the day. You should seek shade when your shadow is shorter than you. Protect yourself by wearing long sleeves, pants, a wide-brimmed hat, and sunglasses year round, whenever you are outdoors.  Tell your health care provider of new moles or changes in moles, especially if there is a change in shape or color. Also tell your provider if a mole is larger than the size of a pencil eraser.  A one-time screening for abdominal aortic aneurysm (AAA) and surgical repair of large AAAs by ultrasound is recommended for men aged 78 75 years who are current or former smokers.  Stay current with your vaccines (immunizations). Document Released: 11/05/2007 Document Revised: 02/27/2013 Document Reviewed: 10/04/2010 [facility] Patient Information 2014 [place], [place].

## 2013-09-30 NOTE — Assessment & Plan Note (Addendum)

## 2013-09-30 NOTE — Progress Notes (Signed)
Pre visit review using our clinic review tool, if applicable. No additional management support is needed unless otherwise documented below in the visit note. 

## 2013-09-30 NOTE — Progress Notes (Signed)
Subjective:    Patient ID: [name] PolingKenneth L [name], male    DOB: December 27, 1933, 80 y.o.   MRN: [id number]  Anemia Presents for follow-up visit. There has been no abdominal pain, anorexia, bruising/bleeding easily, confusion, fever, leg swelling, light-headedness, malaise/fatigue, pallor, palpitations, paresthesias, pica or weight loss. Signs of blood loss that are not present include hematemesis, hematochezia and melena. Past treatments include nothing.      Review of Systems  Constitutional: Negative.  Negative for fever, chills, weight loss, malaise/fatigue, diaphoresis, activity change, appetite change, fatigue and unexpected weight change.  HENT: Negative.   Eyes: Negative.   Respiratory: Negative.  Negative for cough, choking, chest tightness, shortness of breath, wheezing and stridor.   Cardiovascular: Negative.  Negative for chest pain, palpitations and leg swelling.  Gastrointestinal: Negative.  Negative for nausea, vomiting, abdominal pain, diarrhea, constipation, blood in stool, melena, hematochezia, abdominal distention, anorexia and hematemesis.  Endocrine: Negative.   Genitourinary: Negative.  Negative for dysuria, urgency, hematuria, flank pain, decreased urine volume and difficulty urinating.  Musculoskeletal: Negative.  Negative for arthralgias, back pain, gait problem, joint swelling, myalgias, neck pain and neck stiffness.  Skin: Negative.  Negative for pallor.  Allergic/Immunologic: Negative.   Neurological: Negative.  Negative for dizziness, tremors, seizures, weakness, light-headedness and paresthesias.  Hematological: Negative.  Negative for adenopathy. Does not bruise/bleed easily.  Psychiatric/Behavioral: Negative.  Negative for confusion.       Objective:   Physical Exam  Vitals reviewed. Constitutional: He is oriented to person, place, and time. He appears well-developed and well-nourished. No distress.  HENT:  Head: Normocephalic and atraumatic.  Mouth/Throat:  Oropharynx is clear and moist. No oropharyngeal exudate.  Eyes: Conjunctivae are normal. Right eye exhibits no discharge. Left eye exhibits no discharge. No scleral icterus.  Neck: Normal range of motion. Neck supple. No JVD present. No tracheal deviation present. No thyromegaly present.  Cardiovascular: Normal rate, regular rhythm and intact distal pulses.  Exam reveals no gallop and no friction rub.   No murmur heard. Pulmonary/Chest: Effort normal and breath sounds normal. No stridor. No respiratory distress. He has no wheezes. He has no rales. He exhibits no tenderness.  Abdominal: Soft. Bowel sounds are normal. He exhibits no distension and no mass. There is no tenderness. There is no rebound and no guarding. Hernia confirmed negative in the right inguinal area and confirmed negative in the left inguinal area.  Genitourinary: Rectum normal, prostate normal, testes normal and penis normal. Rectal exam shows no external hemorrhoid, no internal hemorrhoid, no fissure, no mass, no tenderness and anal tone normal. Guaiac negative stool. Prostate is not enlarged and not tender. Right testis shows no mass, no swelling and no tenderness. Right testis is descended. Left testis shows no mass, no swelling and no tenderness. Left testis is descended. Uncircumcised. No phimosis, paraphimosis, hypospadias, penile erythema or penile tenderness. No discharge found.  Musculoskeletal: Normal range of motion. He exhibits no edema and no tenderness.  Lymphadenopathy:    He has no cervical adenopathy.       Right: No inguinal adenopathy present.       Left: No inguinal adenopathy present.  Neurological: He is oriented to person, place, and time.  Skin: Skin is warm and dry. No rash noted. He is not diaphoretic. No erythema. No pallor.  Psychiatric: He has a normal mood and affect. His behavior is normal. Judgment and thought content normal.     Lab Results  Component Value Date   WBC 8.5 09/26/2013   HGB  12.5*  09/26/2013   HCT 37.0* 09/26/2013   PLT 231.0 09/26/2013   GLUCOSE 98 09/26/2013   CHOL 131 11/19/2012   TRIG 182.0* 11/19/2012   HDL 39.70 11/19/2012   LDLCALC 55 11/19/2012   ALT 33 11/19/2012   AST 28 11/19/2012   NA 138 09/26/2013   K 4.2 09/26/2013   CL 102 09/26/2013   CREATININE 1.4 09/26/2013   BUN 22 09/26/2013   CO2 30 09/26/2013   TSH 1.58 11/08/2012   INR 4.2* 09/26/2013       Assessment & Plan:

## 2013-09-30 NOTE — Assessment & Plan Note (Signed)
He is doing well on crestor I will check his FLP today

## 2013-09-30 NOTE — Assessment & Plan Note (Signed)
I don't see any evidence of blood loss I will recheck his CBC today and will look at his vitamin levels as well

## 2013-09-30 NOTE — Progress Notes (Signed)
2D Echocardiogram Complete.  09/30/2013   Romir Klimowicz, RDCS 

## 2013-10-01 ENCOUNTER — Telehealth: Payer: Self-pay | Admitting: Internal Medicine

## 2013-10-01 NOTE — Telephone Encounter (Signed)
Relevant patient education assigned to patient using Emmi. ° °

## 2013-10-04 ENCOUNTER — Ambulatory Visit (INDEPENDENT_AMBULATORY_CARE_PROVIDER_SITE_OTHER): Payer: Medicare Other | Admitting: *Deleted

## 2013-10-04 DIAGNOSIS — Z5181 Encounter for therapeutic drug level monitoring: Secondary | ICD-10-CM

## 2013-10-04 DIAGNOSIS — Z7901 Long term (current) use of anticoagulants: Secondary | ICD-10-CM

## 2013-10-04 DIAGNOSIS — I4891 Unspecified atrial fibrillation: Secondary | ICD-10-CM

## 2013-10-04 LAB — POCT INR: INR: 2.5

## 2013-10-07 ENCOUNTER — Encounter (HOSPITAL_COMMUNITY): Payer: Self-pay | Admitting: *Deleted

## 2013-10-07 ENCOUNTER — Encounter (HOSPITAL_COMMUNITY): Admission: RE | Disposition: A | Discharge status: Home / Self Care | Payer: Self-pay | Attending: Internal Medicine

## 2013-10-07 ENCOUNTER — Ambulatory Visit (HOSPITAL_COMMUNITY)
Admission: RE | Admit: 2013-10-07 | Discharge: 2013-10-07 | Disposition: A | Discharge status: Home / Self Care | Payer: Medicare Other | Attending: Internal Medicine | Admitting: Internal Medicine

## 2013-10-07 DIAGNOSIS — Z79899 Other long term (current) drug therapy: Secondary | ICD-10-CM | POA: Insufficient documentation

## 2013-10-07 DIAGNOSIS — I4729 Other ventricular tachycardia: Secondary | ICD-10-CM | POA: Insufficient documentation

## 2013-10-07 DIAGNOSIS — Z951 Presence of aortocoronary bypass graft: Secondary | ICD-10-CM | POA: Insufficient documentation

## 2013-10-07 DIAGNOSIS — I509 Heart failure, unspecified: Secondary | ICD-10-CM | POA: Insufficient documentation

## 2013-10-07 DIAGNOSIS — Z87891 Personal history of nicotine dependence: Secondary | ICD-10-CM | POA: Insufficient documentation

## 2013-10-07 DIAGNOSIS — Z4502 Encounter for adjustment and management of automatic implantable cardiac defibrillator: Secondary | ICD-10-CM | POA: Insufficient documentation

## 2013-10-07 DIAGNOSIS — I5022 Chronic systolic (congestive) heart failure: Secondary | ICD-10-CM | POA: Insufficient documentation

## 2013-10-07 DIAGNOSIS — Z7901 Long term (current) use of anticoagulants: Secondary | ICD-10-CM | POA: Insufficient documentation

## 2013-10-07 DIAGNOSIS — I252 Old myocardial infarction: Secondary | ICD-10-CM | POA: Insufficient documentation

## 2013-10-07 DIAGNOSIS — I472 Ventricular tachycardia, unspecified: Secondary | ICD-10-CM | POA: Insufficient documentation

## 2013-10-07 DIAGNOSIS — I10 Essential (primary) hypertension: Secondary | ICD-10-CM | POA: Insufficient documentation

## 2013-10-07 DIAGNOSIS — I4891 Unspecified atrial fibrillation: Secondary | ICD-10-CM | POA: Insufficient documentation

## 2013-10-07 DIAGNOSIS — Z86718 Personal history of other venous thrombosis and embolism: Secondary | ICD-10-CM | POA: Insufficient documentation

## 2013-10-07 DIAGNOSIS — E785 Hyperlipidemia, unspecified: Secondary | ICD-10-CM | POA: Insufficient documentation

## 2013-10-07 DIAGNOSIS — I2589 Other forms of chronic ischemic heart disease: Secondary | ICD-10-CM | POA: Insufficient documentation

## 2013-10-07 DIAGNOSIS — I2510 Atherosclerotic heart disease of native coronary artery without angina pectoris: Secondary | ICD-10-CM | POA: Insufficient documentation

## 2013-10-07 HISTORY — PX: IMPLANTABLE CARDIOVERTER DEFIBRILLATOR GENERATOR CHANGE: SHX5474

## 2013-10-07 LAB — SURGICAL PCR SCREEN
MRSA, PCR: NEGATIVE
Staphylococcus aureus: NEGATIVE

## 2013-10-07 LAB — PROTIME-INR
INR: 2.18 — ABNORMAL HIGH (ref 0.00–1.49)
PROTHROMBIN TIME: 23.6 s — AB (ref 11.6–15.2)

## 2013-10-07 SURGERY — IMPLANTABLE CARDIOVERTER DEFIBRILLATOR GENERATOR CHANGE
Anesthesia: LOCAL

## 2013-10-07 MED ORDER — CHLORHEXIDINE GLUCONATE 4 % EX LIQD
60.0000 mL | Freq: Once | CUTANEOUS | Status: DC
  Filled 2013-10-07: qty 60

## 2013-10-07 MED ORDER — SODIUM CHLORIDE 0.9 % IV SOLN: INTRAVENOUS | Status: AC

## 2013-10-07 MED ORDER — ACETAMINOPHEN 325 MG PO TABS
325.0000 mg | ORAL_TABLET | ORAL | Status: DC | PRN
  Filled 2013-10-07: qty 2

## 2013-10-07 MED ORDER — LIDOCAINE HCL (PF) 1 % IJ SOLN
INTRAMUSCULAR | Status: AC
  Filled 2013-10-07: qty 60

## 2013-10-07 MED ORDER — CEFAZOLIN SODIUM-DEXTROSE 2-3 GM-% IV SOLR: 2.0000 g | INTRAVENOUS | Status: DC

## 2013-10-07 MED ORDER — MUPIROCIN 2 % EX OINT
TOPICAL_OINTMENT | Freq: Two times a day (BID) | CUTANEOUS | Status: DC
  Administered 2013-10-07: 1 via NASAL
  Filled 2013-10-07: qty 22

## 2013-10-07 MED ORDER — MUPIROCIN 2 % EX OINT
TOPICAL_OINTMENT | CUTANEOUS | Status: AC
  Administered 2013-10-07: 1 via NASAL
  Filled 2013-10-07: qty 22

## 2013-10-07 MED ORDER — SODIUM CHLORIDE 0.9 % IR SOLN
80.0000 mg | Status: DC
  Filled 2013-10-07: qty 2

## 2013-10-07 MED ORDER — SODIUM CHLORIDE 0.9 % IV SOLN
INTRAVENOUS | Status: DC
Start: 2013-10-07 — End: 2013-10-09
  Administered 2013-10-07: 16:00:00 via INTRAVENOUS

## 2013-10-07 MED ORDER — ONDANSETRON HCL 4 MG/2ML IJ SOLN: 4.0000 mg | Freq: Four times a day (QID) | INTRAMUSCULAR | Status: DC | PRN

## 2013-10-07 NOTE — H&P (View-Only) (Signed)
 ELECTROPHYSIOLOGY OFFICE NOTE   Patient ID: [name] MRN: [id number], DOB/AGE: 08/15/1933   Date of Visit: 09/26/2013  Primary Physician: Thomas Jones, MD Primary Cardiologist: previously Stuckey, MD Primary EP: Klein, MD Reason for Visit: EP/device follow-up  History of Present Illness  [name] is a 79 y.o. male with an ischemic CM s/p ICD implantation, PMVT and monomorphic VT s/p appropriate ICD shocks, PAF and CAD s/p CABG who presents today for routine EP follow-up. His ICD battery has reached ERI.  Since last being seen in our clinic, he reports he is doing well and has no complaints. He is quite active. He denies chest pain or shortness of breath. He denies palpitations, dizziness, near syncope or syncope. He denies LE swelling, orthopnea, PND or recent weight gain. No ICD shocks. He is compliant and tolerating medications without difficulty.     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 He underwent Myoview scanning which demonstrated no perfusion defects. It was elected to treat this medically. Ejection fraction was 48%.   Past Medical History Past Medical History  Diagnosis Date  . DVT (deep venous thrombosis)   . Ischemic cardiomyopathy     CABG vx4 1986 and PTCA in 1991; stent 1997 cath 2008  . Dyslipidemia   . HTN (hypertension)   . Atrial fibrillation   . Ventricular tachycardia     With prior shock therapy and antitachycardia pacing  . Automatic implantable cardiac defibrillator St Judes 3/08    St. Jude  . History of colonoscopy 10/14/2002  . Coronary artery disease   . Fatigue 03/29/2012    Past Surgical History Past Surgical History  Procedure Laterality Date  . Cardiac defibrillator placement  3/08    St. Jude  . Ptca  1997  . Coronary artery bypass graft  1996    last cath reports an atretic LIMA to the LAD and patent SVG to RCA      Allergies/Intolerances No Known Allergies  Current Home Medications Current Outpatient Prescriptions  Medication Sig Dispense Refill  . amiodarone (PACERONE) 200 MG tablet Take 1 tablet (200 mg total) by mouth daily.  90 tablet  1  . atenolol (TENORMIN) 100 MG tablet Take 1 tablet (100 mg total) by mouth daily.  90 tablet  1  . benzocaine-menthol (CHLORAEPTIC) 6-10 MG lozenge Take 1 lozenge by mouth as needed for sore throat.      . clobetasol cream (TEMOVATE) 0.05 % Apply 1 application topically daily.       . digoxin (LANOXIN) 0.125 MG tablet Take 0.125 mg by mouth every other day.      . fish oil-omega-3 fatty acids 1000 MG capsule Take 1 g by mouth 2 (two) times daily.       . Multiple Vitamin (MULTIVITAMIN) tablet Take 1 tablet by mouth daily.       . nitroGLYCERIN (NITROSTAT) 0.4 MG SL tablet Place 0.4 mg under the tongue every 5 (five) minutes as needed for chest pain.       . ramipril (ALTACE) 5 MG capsule Take 5 mg by mouth daily.      . rosuvastatin (CRESTOR) 40 MG tablet Take 1 tablet (40 mg total) by mouth daily. STOP SIMVASTATIN  90 tablet  3  . warfarin (COUMADIN) 2 MG tablet Take 2 mg by mouth daily. Take 1 tablet daily as directed by coumadin clinic         No current facility-administered medications for this visit.    Social History History   Social History  . Marital Status: Married    Spouse Name: N/A    Number of Children: 4  . Years of Education: N/A   Occupational History  . Employed in Sales in the [employer]    Social History Main Topics  . Smoking status: Former Smoker    Types: Cigars  . Smokeless tobacco: Never Used     Comment: quit smoking cigerettes in 1996  . Alcohol Use: 10.5 oz/week    21 drink(s) per week     Comment: 2 glasses of wine daily  . Drug Use: No  . Sexual Activity: Not Currently   Other Topics Concern  . Not on file   Social History Narrative   HSG, College in '75 x 2 years. Married '57 - 11yr/divorced; married '69- 7  yrs/divorced; married '79 - 16yrs/divorced; '96 -. 3 boys - '58, '59, '60; 1 girl - '61; 9 grandchildren, 4 great-grands. 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 ACP - yes CPR, no long term mechanical ventilation; no heroic measures in the face of irreversible disease or disability.     Review of Systems General: No chills, fever, night sweats or weight changes Cardiovascular: No chest pain, dyspnea on exertion, edema, orthopnea, palpitations, paroxysmal nocturnal dyspnea Dermatological: No rash, lesions or masses Respiratory: No cough, dyspnea Urologic: No hematuria, dysuria Abdominal: No nausea, vomiting, diarrhea, bright red blood per rectum, melena, or hematemesis Neurologic: No visual changes, weakness, changes in mental status All other systems reviewed and are otherwise negative except as noted above.  Physical Exam Vitals: Blood pressure 110/66, pulse 76, weight 217 lb (98.431 kg).  General: Well developed, well appearing 79 y.o. male in no acute distress. HEENT: Normocephalic, atraumatic. EOMs intact. Sclera nonicteric. Oropharynx clear.  Neck: Supple. No JVD. Lungs: Respirations regular and unlabored, CTA bilaterally. No wheezes, rales or rhonchi. Heart: RRR. S1, S2 present. No murmurs, rub, S3 or S4. Abdomen: Soft, non-tender, non-distended. BS present x 4 quadrants.  Extremities: No clubbing, cyanosis or edema. PT/Radials 2+ and equal bilaterally. Psych: Normal affect. Neuro: Alert and oriented X 3. Moves all extremities spontaneously.   Diagnostics  Echocardiogram most recent from May 2012 Study Conclusions - Left ventricle: The cavity size was mildly dilated. Wall thickness was normal. Systolic function was mildly to moderately reduced. The estimated ejection fraction was in the range of 40% to 45%. Diffuse hypokinesis. Doppler parameters are consistent with abnormal left ventricular relaxation (grade 1 diastolic dysfunction). - Ventricular septum: Septal  motion showed dyssynergy. These changes are consistent with a left bundle branch block. - Mitral valve: Mild regurgitation. - Left atrium: The atrium was mildly dilated. - Right ventricle: The cavity size was mildly dilated. - Right atrium: The atrium was mildly dilated.  12-lead ECG today - A paced V sensed at 76 bpm, LBBB; PR 200, QRS 190, QTc 504 Device interrogation today - Battery reached ERI 08/30/2013 otherwise normal device function. Thresholds and sensing consistent with previous device measurements. Impedance trends stable over time. No evidence of any ventricular arrhythmias. 1 mode switch episode, 12 seconds in duration, EGM shows atrial tachycardia. Histogram distribution appropriate for patient and level of activity. Device programmed at appropriate safety margins. Device programmed to optimize intrinsic conduction. Patient education completed including shock plan. Scheduled for ICD generator change with Dr. Klein on 10/07/2013.  Assessment and Plan 1. ICD battery at ERI 2. Polymorphic and monomorphic VT s/p   appropriate ICD shocks 3. Ischemic cardiomyopathy 4. Chronic systolic HF 5. Coronary artery disease  [name] ICD battery reached ERI on 08/30/2013. Otherwise normal device function with no programming changes made. We discussed need for ICD generator change, including procedure risks and benefits. Risks, benefits and alternatives to ICD generator change were reviewed in detail. These risks include, but are not limited to, lead dislodgement, bleeding and infection. [name] expressed verbal understanding and agrees to proceed. This will be scheduled with Dr. Klein at the next available time. As required for NCDR ICD registry, will update echo. Of note, he has known LBBB with QRS duration of 190 msec; however, he is V paced only 9% of the time and he has minimal CHF symptoms and may not meet criteria for CRT at this time. Will confer with Dr. Klein but have tentatively scheduled  ICD generator change for 10/07/2013.  Signed, [name] Ybanez O Leone Mobley, PA-C 09/27/2013, 11:20 PM    

## 2013-10-07 NOTE — Discharge Instructions (Signed)
Pacemaker Battery Change, Care After  Refer to this sheet in the next few weeks. These instructions provide you with information on caring for yourself after your procedure. Your health care provider may also give you more specific instructions. Your treatment has been planned according to current medical practices, but problems sometimes occur. Call your health care provider if you have any problems or questions after your procedure. WHAT TO EXPECT AFTER THE PROCEDURE After your procedure, it is typical to have the following sensations:  Soreness at the pacemaker site. HOME CARE INSTRUCTIONS   Keep the incision clean and dry.  Unless advised otherwise, you may shower beginning 48 hours after your procedure.  For the first week after the replacement, avoid stretching motions that pull at the incision site and avoid heavy exercise with the arm on the same side as the incision.  Only take over-the-counter or prescription medicines for pain, discomfort, or fever as directed by your health care provider.  Your health care provider will tell you when you will need to next test your pacemaker by telephone or when to return to the office for follow up for removal of stitches. SEEK MEDICAL CARE IF:   You have pain at the incision site that is not relieved by over-the-counter or prescription medicine.  There is drainage or pus from the incision site.  There is swelling larger than a lime at the incision site.  You develop red streaking that extends above or below the incision site.  You feel brief, intermittent palpitations, lightheadedness, or any symptoms that you feel might be related to your heart. SEEK IMMEDIATE MEDICAL CARE IF:   You experience chest pain that is different than the pain at the pacemaker site.  Shortness of breath.  Palpitations or irregular heart beat.  Lightheadedness that does not go away quickly.  Fainting.  You have pain that gets worse and is not relieved by  medicine. MAKE SURE YOU:   Understand these instructions.  Will watch your condition.  Will get help right away if you are not doing well or get worse. Document Released: 02/27/2013 Document Reviewed: 11/21/2012 [facility] Patient Information 2014 [place], [place].    Remove bulky dressing in Am Dermabond will come off in next 10-14 days Keep wound dry until tomorrow evening  Wound check in office

## 2013-10-07 NOTE — CV Procedure (Signed)
Preoperative diagnosis ischemic cardiomyopathy  VT  Postoperative diagnosis same/   Procedure: Generator replacement     Following informed consent the patient was brought to the electrophysiology laboratory in place of the fluoroscopic table in the supine position after routine prep and drape lidocaine was infiltrated in the region of the previous incision and carried down to later the device pocket using sharp dissection and electrocautery. The pocket was opened the device was freed up and was explanted.  Interrogation of the previously implanted ICD ventricular lead St Jude [id number]  demonstrated an R wave of 11.8  millivolts., and impedance of 405 ohms, and a pacing threshold of 1.0 volts at 0.5 msec.    The previously implanted atrial lead St Jude [id number] demonstrated a P-wave amplitude of 1.4 milllivolts  and impedance of  452 ohms, and a pacing threshold of 0.5 volts at @ 0.9 milliseconds.  The leads were inspected. Repair was not needed. The leads were then attached to a St Jude  pulse generator, serial number [id number].    Through the device measurements were confirmed  High voltage impedances were 44 ohms  The pocket was irrigated with antibiotic containing saline solution hemostasis was assured and the leads and the device were placed in the pocket. The wound was then closed in 2 layers in normal fashion. Dermabond dressing was applied  The patient tolerated the procedure without apparent complication.  DFT testing was not performed  Sherryl Manges   \

## 2013-10-07 NOTE — Interval H&P Note (Signed)
  ICD Criteria  Current LVEF:35-40% ;Obtained < 1 month ago.  NYHA Functional Classification: Class I  Heart Failure History:  No.  Non-Ischemic Dilated Cardiomyopathy History:  No.  Atrial Fibrillation/Atrial Flutter:  No.  Ventricular Tachycardia History:  Yes, No hemodynamic instability. VT Type:  SVT - polymorphic.  Cardiac Arrest History:  No  History of Syndromes with Risk of Sudden Death:  No.  Previous ICD:  Yes, ICD Type:  Dual, Reason for ICD:  Secondary, reason for secondary prevention:  Syncope with Inducible VT.  Electrophysiology Study: No.  Prior MI: yes > 40 days  PPM: No.  OSA:  No  Patient Life Expectancy of >=1 year: Yes.  Anticoagulation Therapy:  Patient is NOT on anticoagulation therapy.   Beta Blocker Therapy:  Yes.   Ace Inhibitor/ARB Therapy:  Yes.History and Physical Interval Note:  10/07/2013 5:35 PM  [name]  has presented today for surgery, with the diagnosis of Battery Depletion  The various methods of treatment have been discussed with the patient and family. After consideration of risks, benefits and other options for treatment, the patient has consented to  Procedure(s): IMPLANTABLE CARDIOVERTER DEFIBRILLATOR GENERATOR CHANGE (N/A) as a surgical intervention .  The patient's history has been reviewed, patient examined, no change in status, stable for surgery.  I have reviewed the patient's chart and labs.  Questions were answered to the patient's satisfaction.     Duke Salvia

## 2013-10-18 ENCOUNTER — Ambulatory Visit (INDEPENDENT_AMBULATORY_CARE_PROVIDER_SITE_OTHER): Payer: Medicare Other | Admitting: *Deleted

## 2013-10-18 ENCOUNTER — Ambulatory Visit (INDEPENDENT_AMBULATORY_CARE_PROVIDER_SITE_OTHER): Payer: Medicare Other | Admitting: Surgery

## 2013-10-18 DIAGNOSIS — I2589 Other forms of chronic ischemic heart disease: Secondary | ICD-10-CM

## 2013-10-18 DIAGNOSIS — I509 Heart failure, unspecified: Secondary | ICD-10-CM

## 2013-10-18 DIAGNOSIS — I5042 Chronic combined systolic (congestive) and diastolic (congestive) heart failure: Secondary | ICD-10-CM

## 2013-10-18 DIAGNOSIS — Z7901 Long term (current) use of anticoagulants: Secondary | ICD-10-CM

## 2013-10-18 DIAGNOSIS — Z5181 Encounter for therapeutic drug level monitoring: Secondary | ICD-10-CM

## 2013-10-18 DIAGNOSIS — I4891 Unspecified atrial fibrillation: Secondary | ICD-10-CM

## 2013-10-18 DIAGNOSIS — I255 Ischemic cardiomyopathy: Secondary | ICD-10-CM

## 2013-10-18 LAB — MDC_IDC_ENUM_SESS_TYPE_INCLINIC
Brady Statistic RV Percent Paced: 26 %
HighPow Impedance: 35 Ohm
HighPow Impedance: 35.4311
Implantable Pulse Generator Serial Number: 7194807
Lead Channel Pacing Threshold Amplitude: 0.75 V
Lead Channel Pacing Threshold Amplitude: 0.75 V
Lead Channel Pacing Threshold Pulse Width: 0.5 ms
Lead Channel Pacing Threshold Pulse Width: 0.5 ms
Lead Channel Sensing Intrinsic Amplitude: 0.9 mV
Lead Channel Sensing Intrinsic Amplitude: 10.8 mV
Lead Channel Setting Pacing Amplitude: 2 V
Lead Channel Setting Pacing Amplitude: 2.5 V
Lead Channel Setting Pacing Pulse Width: 0.5 ms
Lead Channel Setting Sensing Sensitivity: 0.5 mV
MDC IDC MSMT BATTERY REMAINING LONGEVITY: 91.2 mo
MDC IDC MSMT LEADCHNL RA IMPEDANCE VALUE: 375 Ohm
MDC IDC MSMT LEADCHNL RA PACING THRESHOLD PULSEWIDTH: 0.5 ms
MDC IDC MSMT LEADCHNL RV IMPEDANCE VALUE: 337.5 Ohm
MDC IDC MSMT LEADCHNL RV PACING THRESHOLD AMPLITUDE: 1.25 V
MDC IDC MSMT LEADCHNL RV PACING THRESHOLD AMPLITUDE: 1.25 V
MDC IDC MSMT LEADCHNL RV PACING THRESHOLD PULSEWIDTH: 0.5 ms
MDC IDC SESS DTM: 20150529090559
MDC IDC SET ZONE DETECTION INTERVAL: 475 ms
MDC IDC STAT BRADY RA PERCENT PACED: 99.25 %
Zone Setting Detection Interval: 280 ms

## 2013-10-18 LAB — POCT INR: INR: 2.1

## 2013-10-18 NOTE — Progress Notes (Signed)
Wound check appointment. Steri-strips removed. Wound without redness or edema. Incision edges approximated, wound well healed. Normal device function. Thresholds, sensing, and impedances consistent with implant measurements. Device programmed at 3.5V for extra safety margin until 3 month visit. Histogram distribution appropriate for patient and level of activity. 135 mode switches all <1 minute, + coumadin.  No ventricular arrhythmias noted. Patient educated about wound care, arm mobility, lifting restrictions, shock plan. ROV in 3 months with implanting physician.

## 2013-10-22 ENCOUNTER — Other Ambulatory Visit (HOSPITAL_COMMUNITY): Payer: Medicare Other

## 2013-10-30 ENCOUNTER — Encounter: Payer: Medicare Other | Admitting: Internal Medicine

## 2013-10-31 ENCOUNTER — Encounter: Payer: Self-pay | Admitting: Internal Medicine

## 2013-11-06 ENCOUNTER — Encounter: Payer: Self-pay | Admitting: Cardiology

## 2013-11-08 ENCOUNTER — Ambulatory Visit (INDEPENDENT_AMBULATORY_CARE_PROVIDER_SITE_OTHER): Payer: Medicare Other | Admitting: *Deleted

## 2013-11-08 DIAGNOSIS — I4891 Unspecified atrial fibrillation: Secondary | ICD-10-CM

## 2013-11-08 DIAGNOSIS — Z7901 Long term (current) use of anticoagulants: Secondary | ICD-10-CM

## 2013-11-08 DIAGNOSIS — Z5181 Encounter for therapeutic drug level monitoring: Secondary | ICD-10-CM

## 2013-11-08 LAB — POCT INR: INR: 3.6

## 2013-11-29 ENCOUNTER — Other Ambulatory Visit: Payer: Self-pay | Admitting: *Deleted

## 2013-11-29 ENCOUNTER — Ambulatory Visit (INDEPENDENT_AMBULATORY_CARE_PROVIDER_SITE_OTHER): Payer: Medicare Other

## 2013-11-29 DIAGNOSIS — R55 Syncope and collapse: Secondary | ICD-10-CM

## 2013-11-29 DIAGNOSIS — I255 Ischemic cardiomyopathy: Secondary | ICD-10-CM

## 2013-11-29 DIAGNOSIS — I472 Ventricular tachycardia: Secondary | ICD-10-CM

## 2013-11-29 DIAGNOSIS — I5042 Chronic combined systolic (congestive) and diastolic (congestive) heart failure: Secondary | ICD-10-CM

## 2013-11-29 DIAGNOSIS — Z4502 Encounter for adjustment and management of automatic implantable cardiac defibrillator: Secondary | ICD-10-CM

## 2013-11-29 DIAGNOSIS — I4891 Unspecified atrial fibrillation: Secondary | ICD-10-CM

## 2013-11-29 DIAGNOSIS — Z5181 Encounter for therapeutic drug level monitoring: Secondary | ICD-10-CM

## 2013-11-29 DIAGNOSIS — I4729 Other ventricular tachycardia: Secondary | ICD-10-CM

## 2013-11-29 DIAGNOSIS — Z7901 Long term (current) use of anticoagulants: Secondary | ICD-10-CM

## 2013-11-29 LAB — POCT INR: INR: 2.9

## 2013-11-29 MED ORDER — RAMIPRIL 5 MG PO CAPS: 5.0000 mg | ORAL_CAPSULE | Freq: Every day | ORAL | Status: DC

## 2013-11-29 MED ORDER — AMIODARONE HCL 200 MG PO TABS: 200.0000 mg | ORAL_TABLET | Freq: Every day | ORAL | Status: DC

## 2013-11-29 MED ORDER — ATENOLOL 100 MG PO TABS: 100.0000 mg | ORAL_TABLET | Freq: Every day | ORAL | Status: DC

## 2013-12-02 ENCOUNTER — Other Ambulatory Visit: Payer: Self-pay | Admitting: Internal Medicine

## 2013-12-11 ENCOUNTER — Telehealth: Payer: Self-pay | Admitting: *Deleted

## 2013-12-11 NOTE — Telephone Encounter (Signed)
He has tremendous foot pain.  Dr. Ralene Cork said he has Arthritis.  I'm wondering should we take him to someone who specializes in Arthritis medicine?  Please call back.  I returned her call.  I informed her that Dr. Ralene Cork is not in the office this week.  He will return next week, at which time I can present him your question.  She stated that she scheduled him an appointment with his family doctor because what he really needs is pain management.  So, we'll see what he suggests.  Dr. Ralene Cork had stated that his next step would be to cut the nerve out and we definitely don't want that.  We'll call back if this doesn't work.

## 2013-12-17 ENCOUNTER — Encounter: Payer: Self-pay | Admitting: Internal Medicine

## 2013-12-17 ENCOUNTER — Ambulatory Visit (INDEPENDENT_AMBULATORY_CARE_PROVIDER_SITE_OTHER): Payer: Medicare Other | Admitting: Internal Medicine

## 2013-12-17 VITALS — BP 106/64 | HR 71 | Temp 98.7°F | Resp 16 | Ht 70.0 in | Wt 217.0 lb

## 2013-12-17 DIAGNOSIS — M19079 Primary osteoarthritis, unspecified ankle and foot: Secondary | ICD-10-CM

## 2013-12-17 DIAGNOSIS — M19072 Primary osteoarthritis, left ankle and foot: Principal | ICD-10-CM

## 2013-12-17 DIAGNOSIS — M19071 Primary osteoarthritis, right ankle and foot: Secondary | ICD-10-CM | POA: Insufficient documentation

## 2013-12-17 MED ORDER — TRAMADOL HCL 50 MG PO TABS: 50.0000 mg | ORAL_TABLET | Freq: Three times a day (TID) | ORAL | Status: DC | PRN

## 2013-12-17 MED ORDER — FLAVOCOXID-CIT ZN BISGLCINATE 500-50 MG PO CAPS: 1.0000 | ORAL_CAPSULE | Freq: Two times a day (BID) | ORAL | Status: DC

## 2013-12-17 NOTE — Assessment & Plan Note (Signed)
Will refer to ortho to see if surgery is an option For now will control the pain with tramadol and limbrel (safe with coumadin)

## 2013-12-17 NOTE — Progress Notes (Signed)
   Subjective:    Patient ID: [name], male    DOB: November 10, 1933, 80 y.o.   MRN: [id number]  Arthritis Presents for follow-up visit. The disease course has been fluctuating. He complains of pain. He reports no stiffness, joint swelling or joint warmth. Affected locations include the left foot and right foot. His pain is at a severity of 3/10. Associated symptoms include pain at night and pain while resting. Pertinent negatives include no diarrhea, dry eyes, dry mouth, dysuria, fatigue, fever, rash, Raynaud's syndrome, uveitis or weight loss. Past treatments include acetaminophen and NSAIDs. The treatment provided mild relief. Factors aggravating his arthritis include activity. Compliance with prior treatments has been good.      Review of Systems  Constitutional: Negative for fever, weight loss and fatigue.  Gastrointestinal: Negative for diarrhea.  Genitourinary: Negative for dysuria.  Musculoskeletal: Positive for arthritis. Negative for joint swelling and stiffness.  Skin: Negative for rash.  All other systems reviewed and are negative.      Objective:   Physical Exam  Vitals reviewed. Constitutional: He is oriented to person, place, and time. He appears well-developed and well-nourished. No distress.  HENT:  Head: Normocephalic and atraumatic.  Mouth/Throat: No oropharyngeal exudate.  Eyes: Conjunctivae are normal. Right eye exhibits no discharge. Left eye exhibits no discharge. No scleral icterus.  Neck: Normal range of motion. Neck supple. No JVD present. No tracheal deviation present. No thyromegaly present.  Cardiovascular: Normal rate, normal heart sounds and intact distal pulses.  Exam reveals no gallop and no friction rub.   No murmur heard. Pulmonary/Chest: Effort normal and breath sounds normal. No stridor. No respiratory distress. He has no wheezes. He has no rales. He exhibits no tenderness.  Abdominal: Soft. Bowel sounds are normal. He exhibits no distension and  no mass. There is no tenderness. There is no rebound and no guarding.  Musculoskeletal: Normal range of motion. He exhibits no edema.       Right foot: He exhibits tenderness (over the MTP joints). He exhibits normal range of motion, no bony tenderness, no swelling, normal capillary refill, no crepitus, no deformity and no laceration.       Left foot: He exhibits tenderness (over the MTP joints). He exhibits normal range of motion, no bony tenderness, no swelling, normal capillary refill, no crepitus, no deformity and no laceration.  Lymphadenopathy:    He has no cervical adenopathy.  Neurological: He is oriented to person, place, and time.  Skin: Skin is warm and dry. No rash noted. He is not diaphoretic. No erythema. No pallor.          Assessment & Plan:

## 2013-12-17 NOTE — Patient Instructions (Signed)

## 2013-12-17 NOTE — Progress Notes (Signed)
Pre visit review using our clinic review tool, if applicable. No additional management support is needed unless otherwise documented below in the visit note. 

## 2013-12-20 ENCOUNTER — Telehealth: Payer: Self-pay | Admitting: Internal Medicine

## 2013-12-20 DIAGNOSIS — M19072 Primary osteoarthritis, left ankle and foot: Principal | ICD-10-CM

## 2013-12-20 DIAGNOSIS — M19071 Primary osteoarthritis, right ankle and foot: Secondary | ICD-10-CM

## 2013-12-20 MED ORDER — FLAVOCOXID-CIT ZN BISGLCINATE 500-50 MG PO CAPS: 1.0000 | ORAL_CAPSULE | Freq: Two times a day (BID) | ORAL | Status: DC

## 2013-12-20 NOTE — Telephone Encounter (Signed)
Patient is requesting script for limbrel 500 to be called in at [facility] on [facility]

## 2013-12-20 NOTE — Telephone Encounter (Signed)
Rx approved and sent e-script to pharmacy.

## 2014-01-02 ENCOUNTER — Ambulatory Visit (INDEPENDENT_AMBULATORY_CARE_PROVIDER_SITE_OTHER): Payer: Medicare Other | Admitting: *Deleted

## 2014-01-02 DIAGNOSIS — Z7901 Long term (current) use of anticoagulants: Secondary | ICD-10-CM

## 2014-01-02 DIAGNOSIS — Z5181 Encounter for therapeutic drug level monitoring: Secondary | ICD-10-CM

## 2014-01-02 DIAGNOSIS — I4891 Unspecified atrial fibrillation: Secondary | ICD-10-CM

## 2014-01-02 LAB — POCT INR: INR: 2.9

## 2014-01-07 ENCOUNTER — Encounter: Payer: Medicare Other | Admitting: Cardiology

## 2014-01-07 ENCOUNTER — Ambulatory Visit (INDEPENDENT_AMBULATORY_CARE_PROVIDER_SITE_OTHER): Payer: Medicare Other | Admitting: Internal Medicine

## 2014-01-07 ENCOUNTER — Encounter: Payer: Self-pay | Admitting: Internal Medicine

## 2014-01-07 VITALS — BP 110/76 | HR 60 | Ht 70.0 in | Wt 216.0 lb

## 2014-01-07 DIAGNOSIS — I255 Ischemic cardiomyopathy: Secondary | ICD-10-CM

## 2014-01-07 DIAGNOSIS — Z95810 Presence of automatic (implantable) cardiac defibrillator: Secondary | ICD-10-CM

## 2014-01-07 DIAGNOSIS — I472 Ventricular tachycardia, unspecified: Secondary | ICD-10-CM

## 2014-01-07 DIAGNOSIS — I4891 Unspecified atrial fibrillation: Secondary | ICD-10-CM

## 2014-01-07 DIAGNOSIS — I480 Paroxysmal atrial fibrillation: Secondary | ICD-10-CM

## 2014-01-07 DIAGNOSIS — I509 Heart failure, unspecified: Secondary | ICD-10-CM

## 2014-01-07 DIAGNOSIS — I2589 Other forms of chronic ischemic heart disease: Secondary | ICD-10-CM

## 2014-01-07 DIAGNOSIS — I4729 Other ventricular tachycardia: Secondary | ICD-10-CM

## 2014-01-07 DIAGNOSIS — I5042 Chronic combined systolic (congestive) and diastolic (congestive) heart failure: Secondary | ICD-10-CM

## 2014-01-07 LAB — MDC_IDC_ENUM_SESS_TYPE_INCLINIC
Brady Statistic RV Percent Paced: 14 %
HIGH POWER IMPEDANCE MEASURED VALUE: 39.9682
Lead Channel Impedance Value: 412.5 Ohm
Lead Channel Pacing Threshold Amplitude: 1.25 V
Lead Channel Pacing Threshold Amplitude: 1.25 V
Lead Channel Pacing Threshold Pulse Width: 0.5 ms
Lead Channel Pacing Threshold Pulse Width: 0.5 ms
Lead Channel Sensing Intrinsic Amplitude: 12 mV
Lead Channel Setting Pacing Amplitude: 2 V
Lead Channel Setting Pacing Amplitude: 2.5 V
MDC IDC MSMT BATTERY REMAINING LONGEVITY: 91.2 mo
MDC IDC MSMT LEADCHNL RA PACING THRESHOLD AMPLITUDE: 0.75 V
MDC IDC MSMT LEADCHNL RA PACING THRESHOLD AMPLITUDE: 0.75 V
MDC IDC MSMT LEADCHNL RA PACING THRESHOLD PULSEWIDTH: 0.5 ms
MDC IDC MSMT LEADCHNL RA PACING THRESHOLD PULSEWIDTH: 0.5 ms
MDC IDC MSMT LEADCHNL RA SENSING INTR AMPL: 1.5 mV
MDC IDC MSMT LEADCHNL RV IMPEDANCE VALUE: 362.5 Ohm
MDC IDC PG SERIAL: 7194807
MDC IDC SESS DTM: 20150818155918
MDC IDC SET LEADCHNL RV PACING PULSEWIDTH: 0.5 ms
MDC IDC SET LEADCHNL RV SENSING SENSITIVITY: 0.5 mV
MDC IDC STAT BRADY RA PERCENT PACED: 99.41 %
Zone Setting Detection Interval: 280 ms
Zone Setting Detection Interval: 475 ms

## 2014-01-07 MED ORDER — ATENOLOL 100 MG PO TABS: 50.0000 mg | ORAL_TABLET | Freq: Every day | ORAL | Status: DC

## 2014-01-07 NOTE — Patient Instructions (Addendum)
Your physician has recommended you make the following change in your medication:  1) DECREASE Atenolol to 50 mg daily  Labs today: Digoxin level, LFT  Remote monitoring is used to monitor your ICD from home. This monitoring reduces the number of office visits required to check your device to one time per year. It allows [place] to keep an eye on the functioning of your device to ensure it is working properly. You are scheduled for a device check from home on 04-10-2014. You may send your transmission at any time that day. If you have a wireless device, the transmission will be sent automatically. After your physician reviews your transmission, you will receive a postcard with your next transmission date.  Your physician recommends that you schedule a follow-up appointment in: May 2016 with Dr.Klein

## 2014-01-07 NOTE — Progress Notes (Signed)
Patient Care Team: Etta Grandchildhomas L Jones, MD as PCP - General (Internal Medicine) Duke SalviaSteven C Klein, MD as Attending Physician (Cardiology)   HPI  [name] is a 80 y.o. male seen in followup for ventricular tachycardia and atrial fibrillation s/p ICD implantation with both appropriate and inappropriate therapy. He has a history of ischemic heart disease with prior CABG and moderate depression of LV systolic function.    And with device generator reimplantation 5/15.  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  Ejection fraction was 35-40% echo 2015    Past Medical History  Diagnosis Date  . DVT (deep venous thrombosis)   . Ischemic cardiomyopathy     CABG vx4 1986 and PTCA in 1991; stent 1997 cath 2008  . Dyslipidemia   . HTN (hypertension)   . Atrial fibrillation   . Ventricular tachycardia     With prior shock therapy and antitachycardia pacing  . Automatic implantable cardiac defibrillator St Judes 3/08    St. Jude  . History of colonoscopy 10/14/2002  . Coronary artery disease   . Fatigue 03/29/2012    Past Surgical History  Procedure Laterality Date  . Cardiac defibrillator placement  3/08    St. Jude  . Ptca  1997  . Coronary artery bypass graft  1996    last cath reports an atretic LIMA to the LAD and patent SVG to RCA    Current Outpatient Prescriptions  Medication Sig Dispense Refill  . amiodarone (PACERONE) 200 MG tablet Take 1 tablet (200 mg total) by mouth daily.  90 tablet  0  . atenolol (TENORMIN) 100 MG tablet Take 1 tablet (100 mg total) by mouth daily.  90 tablet  0  . clobetasol  cream (TEMOVATE) 0.05 % Apply 1 application topically daily.       . digoxin (LANOXIN) 0.125 MG tablet Take 0.125 mg by mouth every other day.      . fish oil-omega-3 fatty acids 1000 MG capsule Take 1 g by mouth 2 (two) times daily.       . Flavocoxid-Cit Zn Bisglcinate (LIMBREL500) 500-50 MG CAPS Take 1 capsule by mouth 2 (two) times daily.  60 capsule  11  . Multiple Vitamin (MULTIVITAMIN) tablet Take 1 tablet by mouth daily.       . nitroGLYCERIN (NITROSTAT) 0.4 MG SL tablet Place 0.4 mg under the tongue every 5 (five) minutes as needed for chest pain.       . ramipril (ALTACE) 5 MG capsule Take 1 capsule (5 mg total) by mouth daily.  90 capsule  0  . rosuvastatin (CRESTOR) 40 MG tablet Take 1 tablet (40 mg total) by mouth daily. STOP SIMVASTATIN  90 tablet  3  . traMADol (ULTRAM) 50 MG tablet Take 1 tablet (50 mg total) by mouth every 8 (eight) hours as needed.  65 tablet  5  . warfarin (COUMADIN) 2 MG tablet TAKE AS DIRECTED BY COUMADIN CLINIC  120 tablet  1   No current facility-administered medications for this visit.    No Known Allergies  Review of Systems negative except from HPI and PMH  Physical Exam BP 110/76  Pulse 60  Ht 5\' 10"  (1.778 m)  Wt 216 lb (97.977 kg)  BMI 30.99 kg/m2 Well developed and nourished in no acute distress HENT normal Neck supple with JVP-flat Clear Device pocket well healed; without hematoma or erythema.  There is no tethering Regular rate and rhythm, no murmurs or gallops Abd-soft with active BS No Clubbing cyanosis edema Skin-warm and dry A & Oriented  Grossly normal sensory and motor function  ECG AV pacing  Assessment and  Plan  Ventricular tachycardia-polymorphic/monomorphic  Ischemic cardiomyopathy with prior bypass; ejection fraction 35-40% 5/15  Implantable defibrillator  St Jude   Fatigue  Will decrease atenolol to 50 and see if any improvement Denies symptoms of sleep apnea  Euvolemic continue current meds   No  intercurrent Ventricular tachycardia

## 2014-01-08 LAB — HEPATIC FUNCTION PANEL
ALBUMIN: 3.3 g/dL — AB (ref 3.5–5.2)
ALT: 23 U/L (ref 0–53)
AST: 27 U/L (ref 0–37)
Alkaline Phosphatase: 65 U/L (ref 39–117)
BILIRUBIN TOTAL: 0.5 mg/dL (ref 0.2–1.2)
Bilirubin, Direct: 0.1 mg/dL (ref 0.0–0.3)
Total Protein: 7 g/dL (ref 6.0–8.3)

## 2014-01-08 LAB — DIGOXIN LEVEL: DIGOXIN LVL: 0.6 ng/mL — AB (ref 0.8–2.0)

## 2014-01-15 ENCOUNTER — Encounter: Payer: Self-pay | Admitting: Internal Medicine

## 2014-01-30 ENCOUNTER — Ambulatory Visit (INDEPENDENT_AMBULATORY_CARE_PROVIDER_SITE_OTHER): Payer: Medicare Other

## 2014-01-30 DIAGNOSIS — Z7901 Long term (current) use of anticoagulants: Secondary | ICD-10-CM

## 2014-01-30 DIAGNOSIS — I4891 Unspecified atrial fibrillation: Secondary | ICD-10-CM

## 2014-01-30 DIAGNOSIS — Z5181 Encounter for therapeutic drug level monitoring: Secondary | ICD-10-CM

## 2014-01-30 LAB — POCT INR: INR: 2.2

## 2014-02-06 ENCOUNTER — Encounter: Payer: Self-pay | Admitting: Nurse Practitioner

## 2014-02-06 ENCOUNTER — Ambulatory Visit (INDEPENDENT_AMBULATORY_CARE_PROVIDER_SITE_OTHER): Payer: Medicare Other | Admitting: Nurse Practitioner

## 2014-02-06 ENCOUNTER — Inpatient Hospital Stay (HOSPITAL_COMMUNITY)
Admission: EM | Admit: 2014-02-06 | Discharge: 2014-02-08 | DRG: 193 | Disposition: A | Discharge status: Home / Self Care | Payer: Medicare Other | Attending: Internal Medicine | Admitting: Internal Medicine

## 2014-02-06 ENCOUNTER — Emergency Department (HOSPITAL_COMMUNITY): Payer: Medicare Other

## 2014-02-06 ENCOUNTER — Encounter (HOSPITAL_COMMUNITY): Payer: Self-pay | Admitting: Emergency Medicine

## 2014-02-06 VITALS — BP 130/60 | HR 78 | Temp 98.4°F | Ht 70.0 in | Wt 203.5 lb

## 2014-02-06 DIAGNOSIS — I509 Heart failure, unspecified: Secondary | ICD-10-CM | POA: Diagnosis present

## 2014-02-06 DIAGNOSIS — Z950 Presence of cardiac pacemaker: Secondary | ICD-10-CM

## 2014-02-06 DIAGNOSIS — I4891 Unspecified atrial fibrillation: Secondary | ICD-10-CM | POA: Diagnosis present

## 2014-02-06 DIAGNOSIS — I472 Ventricular tachycardia, unspecified: Secondary | ICD-10-CM

## 2014-02-06 DIAGNOSIS — Z95810 Presence of automatic (implantable) cardiac defibrillator: Secondary | ICD-10-CM

## 2014-02-06 DIAGNOSIS — Z86718 Personal history of other venous thrombosis and embolism: Secondary | ICD-10-CM

## 2014-02-06 DIAGNOSIS — Z87891 Personal history of nicotine dependence: Secondary | ICD-10-CM

## 2014-02-06 DIAGNOSIS — I5022 Chronic systolic (congestive) heart failure: Secondary | ICD-10-CM | POA: Diagnosis present

## 2014-02-06 DIAGNOSIS — M129 Arthropathy, unspecified: Secondary | ICD-10-CM | POA: Diagnosis present

## 2014-02-06 DIAGNOSIS — J189 Pneumonia, unspecified organism: Secondary | ICD-10-CM

## 2014-02-06 DIAGNOSIS — Z951 Presence of aortocoronary bypass graft: Secondary | ICD-10-CM | POA: Diagnosis not present

## 2014-02-06 DIAGNOSIS — Z79899 Other long term (current) drug therapy: Secondary | ICD-10-CM

## 2014-02-06 DIAGNOSIS — D510 Vitamin B12 deficiency anemia due to intrinsic factor deficiency: Secondary | ICD-10-CM

## 2014-02-06 DIAGNOSIS — Z7901 Long term (current) use of anticoagulants: Secondary | ICD-10-CM | POA: Diagnosis not present

## 2014-02-06 DIAGNOSIS — N390 Urinary tract infection, site not specified: Secondary | ICD-10-CM | POA: Diagnosis present

## 2014-02-06 DIAGNOSIS — R918 Other nonspecific abnormal finding of lung field: Secondary | ICD-10-CM | POA: Diagnosis present

## 2014-02-06 DIAGNOSIS — I10 Essential (primary) hypertension: Secondary | ICD-10-CM | POA: Diagnosis present

## 2014-02-06 DIAGNOSIS — J9601 Acute respiratory failure with hypoxia: Secondary | ICD-10-CM | POA: Diagnosis present

## 2014-02-06 DIAGNOSIS — R0602 Shortness of breath: Secondary | ICD-10-CM | POA: Diagnosis present

## 2014-02-06 DIAGNOSIS — R0902 Hypoxemia: Secondary | ICD-10-CM

## 2014-02-06 DIAGNOSIS — I5042 Chronic combined systolic (congestive) and diastolic (congestive) heart failure: Secondary | ICD-10-CM

## 2014-02-06 DIAGNOSIS — E785 Hyperlipidemia, unspecified: Secondary | ICD-10-CM

## 2014-02-06 DIAGNOSIS — I4819 Other persistent atrial fibrillation: Secondary | ICD-10-CM

## 2014-02-06 DIAGNOSIS — A498 Other bacterial infections of unspecified site: Secondary | ICD-10-CM | POA: Diagnosis present

## 2014-02-06 DIAGNOSIS — R531 Weakness: Secondary | ICD-10-CM

## 2014-02-06 DIAGNOSIS — M19071 Primary osteoarthritis, right ankle and foot: Secondary | ICD-10-CM

## 2014-02-06 DIAGNOSIS — Z6829 Body mass index (BMI) 29.0-29.9, adult: Secondary | ICD-10-CM | POA: Diagnosis not present

## 2014-02-06 DIAGNOSIS — I25709 Atherosclerosis of coronary artery bypass graft(s), unspecified, with unspecified angina pectoris: Secondary | ICD-10-CM

## 2014-02-06 DIAGNOSIS — J9600 Acute respiratory failure, unspecified whether with hypoxia or hypercapnia: Secondary | ICD-10-CM | POA: Diagnosis present

## 2014-02-06 DIAGNOSIS — I2589 Other forms of chronic ischemic heart disease: Secondary | ICD-10-CM | POA: Diagnosis present

## 2014-02-06 DIAGNOSIS — I255 Ischemic cardiomyopathy: Secondary | ICD-10-CM

## 2014-02-06 DIAGNOSIS — I482 Chronic atrial fibrillation, unspecified: Secondary | ICD-10-CM

## 2014-02-06 DIAGNOSIS — Z5181 Encounter for therapeutic drug level monitoring: Secondary | ICD-10-CM

## 2014-02-06 DIAGNOSIS — R5381 Other malaise: Secondary | ICD-10-CM

## 2014-02-06 DIAGNOSIS — Z0000 Encounter for general adult medical examination without abnormal findings: Secondary | ICD-10-CM

## 2014-02-06 DIAGNOSIS — I4729 Other ventricular tachycardia: Secondary | ICD-10-CM

## 2014-02-06 DIAGNOSIS — I2510 Atherosclerotic heart disease of native coronary artery without angina pectoris: Secondary | ICD-10-CM | POA: Diagnosis present

## 2014-02-06 DIAGNOSIS — M19072 Primary osteoarthritis, left ankle and foot: Secondary | ICD-10-CM

## 2014-02-06 DIAGNOSIS — I340 Nonrheumatic mitral (valve) insufficiency: Secondary | ICD-10-CM

## 2014-02-06 DIAGNOSIS — R509 Fever, unspecified: Secondary | ICD-10-CM | POA: Diagnosis present

## 2014-02-06 DIAGNOSIS — R5383 Other fatigue: Secondary | ICD-10-CM

## 2014-02-06 DIAGNOSIS — E43 Unspecified severe protein-calorie malnutrition: Secondary | ICD-10-CM | POA: Insufficient documentation

## 2014-02-06 HISTORY — DX: Unspecified osteoarthritis, unspecified site: M19.90

## 2014-02-06 LAB — COMPREHENSIVE METABOLIC PANEL
ALK PHOS: 90 U/L (ref 39–117)
ALT: 28 U/L (ref 0–53)
AST: 41 U/L — ABNORMAL HIGH (ref 0–37)
Albumin: 2.9 g/dL — ABNORMAL LOW (ref 3.5–5.2)
Anion gap: 13 (ref 5–15)
BUN: 26 mg/dL — ABNORMAL HIGH (ref 6–23)
CO2: 23 meq/L (ref 19–32)
CREATININE: 1.18 mg/dL (ref 0.50–1.35)
Calcium: 9.5 mg/dL (ref 8.4–10.5)
Chloride: 99 mEq/L (ref 96–112)
GFR calc Af Amer: 66 mL/min — ABNORMAL LOW (ref >90)
GFR, EST NON AFRICAN AMERICAN: 57 mL/min — AB (ref >90)
GLUCOSE: 107 mg/dL — AB (ref 70–99)
Potassium: 4.4 mEq/L (ref 3.7–5.3)
Sodium: 135 mEq/L — ABNORMAL LOW (ref 137–147)
Total Bilirubin: 0.6 mg/dL (ref 0.3–1.2)
Total Protein: 7.1 g/dL (ref 6.0–8.3)

## 2014-02-06 LAB — CBC
HEMATOCRIT: 36.2 % — AB (ref 39.0–52.0)
HEMOGLOBIN: 11.7 g/dL — AB (ref 13.0–17.0)
MCH: 29.4 pg (ref 26.0–34.0)
MCHC: 32.3 g/dL (ref 30.0–36.0)
MCV: 91 fL (ref 78.0–100.0)
Platelets: 287 10*3/uL (ref 150–400)
RBC: 3.98 MIL/uL — ABNORMAL LOW (ref 4.22–5.81)
RDW: 13.8 % (ref 11.5–15.5)
WBC: 9.3 10*3/uL (ref 4.0–10.5)

## 2014-02-06 LAB — URINE MICROSCOPIC-ADD ON

## 2014-02-06 LAB — PRO B NATRIURETIC PEPTIDE: PRO B NATRI PEPTIDE: 1906 pg/mL — AB (ref 0–450)

## 2014-02-06 LAB — TROPONIN I: Troponin I: <0.3 ng/mL (ref <0.30)

## 2014-02-06 LAB — PROTIME-INR
INR: 2.25 — ABNORMAL HIGH (ref 0.00–1.49)
Prothrombin Time: 24.9 seconds — ABNORMAL HIGH (ref 11.6–15.2)

## 2014-02-06 LAB — URINALYSIS, ROUTINE W REFLEX MICROSCOPIC
GLUCOSE, UA: NEGATIVE mg/dL
KETONES UR: NEGATIVE mg/dL
Nitrite: POSITIVE — AB
Protein, ur: 100 mg/dL — AB
SPECIFIC GRAVITY, URINE: 1.025 (ref 1.005–1.030)
Urobilinogen, UA: 1 mg/dL (ref 0.0–1.0)
pH: 5.5 (ref 5.0–8.0)

## 2014-02-06 LAB — DIGOXIN LEVEL: Digoxin Level: 0.6 ng/mL — ABNORMAL LOW (ref 0.8–2.0)

## 2014-02-06 LAB — LACTIC ACID, PLASMA: Lactic Acid, Venous: 1.6 mmol/L (ref 0.5–2.2)

## 2014-02-06 MED ORDER — NITROGLYCERIN 0.4 MG SL SUBL: 0.4000 mg | SUBLINGUAL_TABLET | SUBLINGUAL | Status: DC | PRN

## 2014-02-06 MED ORDER — ATENOLOL 50 MG PO TABS
50.0000 mg | ORAL_TABLET | Freq: Every day | ORAL | Status: DC
  Administered 2014-02-06 – 2014-02-08 (×3): 50 mg via ORAL
  Filled 2014-02-06 (×3): qty 1

## 2014-02-06 MED ORDER — IPRATROPIUM-ALBUTEROL 0.5-2.5 (3) MG/3ML IN SOLN: 3.0000 mL | RESPIRATORY_TRACT | Status: DC

## 2014-02-06 MED ORDER — DIGOXIN 125 MCG PO TABS
0.1250 mg | ORAL_TABLET | ORAL | Status: DC
  Administered 2014-02-06 – 2014-02-08 (×2): 0.125 mg via ORAL
  Filled 2014-02-06 (×2): qty 1

## 2014-02-06 MED ORDER — DEXTROSE 5 % IV SOLN
500.0000 mg | Freq: Once | INTRAVENOUS | Status: AC
  Administered 2014-02-06: 500 mg via INTRAVENOUS
  Filled 2014-02-06: qty 500

## 2014-02-06 MED ORDER — GUAIFENESIN-DM 100-10 MG/5ML PO SYRP: 5.0000 mL | ORAL_SOLUTION | ORAL | Status: DC | PRN

## 2014-02-06 MED ORDER — SODIUM CHLORIDE 0.9 % IV SOLN
INTRAVENOUS | Status: AC
  Administered 2014-02-06: 15:00:00 via INTRAVENOUS

## 2014-02-06 MED ORDER — GUAIFENESIN ER 600 MG PO TB12
1200.0000 mg | ORAL_TABLET | Freq: Two times a day (BID) | ORAL | Status: DC
  Administered 2014-02-06 – 2014-02-08 (×4): 1200 mg via ORAL
  Filled 2014-02-06 (×5): qty 2

## 2014-02-06 MED ORDER — INFLUENZA VAC SPLIT QUAD 0.5 ML IM SUSY
0.5000 mL | PREFILLED_SYRINGE | INTRAMUSCULAR | Status: DC | PRN
  Filled 2014-02-06: qty 0.5

## 2014-02-06 MED ORDER — SODIUM CHLORIDE 0.9 % IV SOLN
INTRAVENOUS | Status: DC
  Administered 2014-02-06: 17:00:00 via INTRAVENOUS

## 2014-02-06 MED ORDER — DEXTROSE 5 % IV SOLN
1.0000 g | INTRAVENOUS | Status: DC
  Administered 2014-02-07: 1 g via INTRAVENOUS
  Filled 2014-02-06 (×3): qty 10

## 2014-02-06 MED ORDER — IPRATROPIUM BROMIDE 0.02 % IN SOLN: 0.5000 mg | Freq: Four times a day (QID) | RESPIRATORY_TRACT | Status: DC

## 2014-02-06 MED ORDER — FUROSEMIDE 10 MG/ML IJ SOLN
40.0000 mg | Freq: Once | INTRAMUSCULAR | Status: AC
  Administered 2014-02-06: 40 mg via INTRAVENOUS
  Filled 2014-02-06: qty 4

## 2014-02-06 MED ORDER — INFLUENZA VAC SPLIT QUAD 0.5 ML IM SUSY: 0.5000 mL | PREFILLED_SYRINGE | INTRAMUSCULAR | Status: DC

## 2014-02-06 MED ORDER — RAMIPRIL 5 MG PO CAPS
5.0000 mg | ORAL_CAPSULE | Freq: Every day | ORAL | Status: DC
  Administered 2014-02-06 – 2014-02-08 (×3): 5 mg via ORAL
  Filled 2014-02-06 (×3): qty 1

## 2014-02-06 MED ORDER — DEXTROSE 5 % IV SOLN
1.0000 g | Freq: Once | INTRAVENOUS | Status: AC
  Administered 2014-02-06: 1 g via INTRAVENOUS
  Filled 2014-02-06: qty 10

## 2014-02-06 MED ORDER — ALBUTEROL SULFATE (2.5 MG/3ML) 0.083% IN NEBU: 2.5000 mg | INHALATION_SOLUTION | Freq: Four times a day (QID) | RESPIRATORY_TRACT | Status: DC

## 2014-02-06 MED ORDER — WARFARIN SODIUM 2 MG PO TABS
2.0000 mg | ORAL_TABLET | Freq: Once | ORAL | Status: AC
  Administered 2014-02-06: 2 mg via ORAL
  Filled 2014-02-06: qty 1

## 2014-02-06 MED ORDER — IPRATROPIUM-ALBUTEROL 0.5-2.5 (3) MG/3ML IN SOLN
3.0000 mL | Freq: Four times a day (QID) | RESPIRATORY_TRACT | Status: DC
  Administered 2014-02-06: 3 mL via RESPIRATORY_TRACT
  Filled 2014-02-06: qty 3

## 2014-02-06 MED ORDER — SODIUM CHLORIDE 0.9 % IV SOLN
INTRAVENOUS | Status: DC
  Administered 2014-02-06: 20 mL/h via INTRAVENOUS

## 2014-02-06 MED ORDER — DEXTROSE 5 % IV SOLN
500.0000 mg | INTRAVENOUS | Status: DC
  Administered 2014-02-07: 500 mg via INTRAVENOUS
  Filled 2014-02-06 (×3): qty 500

## 2014-02-06 MED ORDER — ATORVASTATIN CALCIUM 20 MG PO TABS
20.0000 mg | ORAL_TABLET | Freq: Every day | ORAL | Status: DC
  Administered 2014-02-06 – 2014-02-07 (×2): 20 mg via ORAL
  Filled 2014-02-06 (×3): qty 1

## 2014-02-06 MED ORDER — WARFARIN - PHARMACIST DOSING INPATIENT: Freq: Every day | Status: DC

## 2014-02-06 MED ORDER — WARFARIN SODIUM 2 MG PO TABS: 2.0000 mg | ORAL_TABLET | Freq: Every day | ORAL | Status: DC

## 2014-02-06 MED ORDER — AMIODARONE HCL 200 MG PO TABS
200.0000 mg | ORAL_TABLET | Freq: Every day | ORAL | Status: DC
  Administered 2014-02-06 – 2014-02-08 (×3): 200 mg via ORAL
  Filled 2014-02-06 (×3): qty 1

## 2014-02-06 NOTE — ED Notes (Signed)
Pt went to [facility] approx an hour ago for general malaise and shortness of breath.  EMS reports [facility] obtained RA O2 sat of 77% and called EMS.  EMS placed 4L/O2/[place] and sats went up to 94% and pt reported feeling much better.  Pt had not reported any chest pain or pain at all.

## 2014-02-06 NOTE — ED Notes (Signed)
Bed: WA05 Expected date:  Expected time:  Means of arrival:  Comments: EMS-[facility] 

## 2014-02-06 NOTE — ED Notes (Signed)
MD hospitalist at bedside.

## 2014-02-06 NOTE — ED Notes (Signed)
Patient transported to X-ray 

## 2014-02-06 NOTE — Progress Notes (Signed)
Pre visit review using our clinic review tool, if applicable. No additional management support is needed unless otherwise documented below in the visit note. 

## 2014-02-06 NOTE — H&P (Signed)
[facility] History and Physical  [name]:[id number] DOB: 10-02-1933 DOA: 02/06/2014  Referring physician: Suzi Roots, MD PCP: Sanda Linger, MD   Chief Complaint: SOB  HPI: [name] is a 80 y.o. male with past medical history of atrial fibrillation, HTN and ventricular tachycardia came in to the hospital complaining about shortness of breath. Patient said for the past several weeks he was not feeling okay, having generalized aches and pains, easy fatigability and shortness of breath. For the past week or 2 he started to get fever and mild cough which is nonproductive. Today he went to see his primary care physician and was found to be hypoxic with oxygen saturation in the 70s so he was sent to the ED for further evaluation. He also reported fever of 101.6 at home. In the ED O2 sats was 77% on room air, placed on 4 L of oxygen to keep his oxygen saturation in the low 90s, chest x-ray showed right-sided pneumonia, urinalysis consistent with mild UTI.   Review of Systems:  Constitutional: has fevers and sweats Eyes: negative for irritation, redness and visual disturbance Ears, nose, mouth, throat, and face: negative for earaches, epistaxis, nasal congestion and sore throat Respiratory: has cough, dyspnea on exertion, and minimal sputum production Cardiovascular: negative for chest pain, dyspnea, lower extremity edema, orthopnea, palpitations and syncope Gastrointestinal: negative for abdominal pain, constipation, diarrhea, melena, nausea and vomiting Genitourinary:negative for dysuria, frequency and hematuria Hematologic/lymphatic: negative for bleeding, easy bruising and lymphadenopathy Musculoskeletal:negative for arthralgias, muscle weakness and stiff joints Neurological: negative for coordination problems, gait problems, headaches and weakness Endocrine: negative for diabetic symptoms including polydipsia, polyuria and weight loss Allergic/Immunologic: negative  for anaphylaxis, hay fever and urticaria  Past Medical History  Diagnosis Date  . DVT (deep venous thrombosis)   . Ischemic cardiomyopathy     CABG vx4 1986 and PTCA in 1991; stent 1997 cath 2008  . Dyslipidemia   . HTN (hypertension)   . Atrial fibrillation   . Ventricular tachycardia     With prior shock therapy and antitachycardia pacing  . Automatic implantable cardiac defibrillator St Judes 3/08    St. Jude  . History of colonoscopy 10/14/2002  . Coronary artery disease   . Fatigue 03/29/2012  . Arthritis    Past Surgical History  Procedure Laterality Date  . Cardiac defibrillator placement  3/08    St. Jude  . Ptca  1997  . Coronary artery bypass graft  1996    last cath reports an atretic LIMA to the LAD and patent SVG to RCA   Social History:   reports that he has quit smoking. His smoking use included Cigars. He has never used smokeless tobacco. He reports that he does not drink alcohol or use illicit drugs.  No Known Allergies  Family History  Problem Relation Age of Onset  . Diabetes    . Heart disease Father   . Ulcers Father   . Hyperlipidemia Father   . Hypertension Father   . Cancer Brother     lung cancer  . Cancer Brother     throat cancer     Prior to Admission medications   Medication Sig Start Date End Date Taking? Authorizing Provider  amiodarone (PACERONE) 200 MG tablet Take 1 tablet (200 mg total) by mouth daily. 11/29/13   Duke Salvia, MD  atenolol (TENORMIN) 100 MG tablet Take 0.5 tablets (50 mg total) by mouth daily. 01/07/14   Duke Salvia, MD  clobetasol cream (TEMOVATE) 0.05 % Apply 1 application topically daily.  11/23/11   Historical Provider, MD  digoxin (LANOXIN) 0.125 MG tablet Take 0.125 mg by mouth every other day.    Historical Provider, MD  fish oil-omega-3 fatty acids 1000 MG capsule Take 1 g by mouth 2 (two) times daily.     Historical Provider, MD  Flavocoxid-Cit Zn Bisglcinate (LIMBREL500) 500-50 MG CAPS Take 1 capsule by  mouth 2 (two) times daily. 12/20/13   Etta Grandchild, MD  Multiple Vitamin (MULTIVITAMIN) tablet Take 1 tablet by mouth daily.     Historical Provider, MD  nitroGLYCERIN (NITROSTAT) 0.4 MG SL tablet Place 0.4 mg under the tongue every 5 (five) minutes as needed for chest pain.     Historical Provider, MD  ramipril (ALTACE) 5 MG capsule Take 1 capsule (5 mg total) by mouth daily. 11/29/13   Duke Salvia, MD  rosuvastatin (CRESTOR) 40 MG tablet Take 1 tablet (40 mg total) by mouth daily. STOP SIMVASTATIN 05/31/13   Duke Salvia, MD  traMADol (ULTRAM) 50 MG tablet Take 1 tablet (50 mg total) by mouth every 8 (eight) hours as needed. 12/17/13   Etta Grandchild, MD  warfarin (COUMADIN) 2 MG tablet TAKE AS DIRECTED BY COUMADIN CLINIC 12/02/13   Duke Salvia, MD   Physical Exam: Filed Vitals:   02/06/14 1411  BP:   Pulse: 64  Temp:   Resp: 20   Constitutional: Oriented to person, place, and time. Well-developed and well-nourished. Cooperative.  Head: Normocephalic and atraumatic.  Nose: Nose normal.  Mouth/Throat: Uvula is midline, oropharynx is clear and moist and mucous membranes are normal.  Eyes: Conjunctivae and EOM are normal. Pupils are equal, round, and reactive to light.  Neck: Trachea normal and normal range of motion. Neck supple.  Cardiovascular: Normal rate, regular rhythm, S1 normal, S2 normal, normal heart sounds and intact distal pulses.   Pulmonary/Chest: scattered rhonchi in the right lung field Abdominal: Soft. Bowel sounds are normal. There is no hepatosplenomegaly. There is no tenderness.  Musculoskeletal: Normal range of motion.  Neurological: Alert and oriented to person, place, and time. Has normal strength. No cranial nerve deficit or sensory deficit.  Skin: Skin is warm, dry and intact.  Psychiatric: Has a normal mood and affect. Speech is normal and behavior is normal.   Labs on Admission:  Basic Metabolic Panel:  Recent Labs Lab 02/06/14 1310  NA 135*  K 4.4    CL 99  CO2 23  GLUCOSE 107*  BUN 26*  CREATININE 1.18  CALCIUM 9.5   Liver Function Tests:  Recent Labs Lab 02/06/14 1310  AST 41*  ALT 28  ALKPHOS 90  BILITOT 0.6  PROT 7.1  ALBUMIN 2.9*   No results found for this basename: LIPASE, AMYLASE,  in the last 168 hours No results found for this basename: AMMONIA,  in the last 168 hours CBC:  Recent Labs Lab 02/06/14 1310  WBC 9.3  HGB 11.7*  HCT 36.2*  MCV 91.0  PLT 287   Cardiac Enzymes:  Recent Labs Lab 02/06/14 1310  TROPONINI <0.30    BNP (last 3 results)  Recent Labs  02/06/14 1310  PROBNP 1906.0*   CBG: No results found for this basename: GLUCAP,  in the last 168 hours  Radiological Exams on Admission: Dg Chest 2 View  02/06/2014   CLINICAL DATA:  Shortness of breath and 15 with anorexia for 3 weeks; history of previous CABG and stent placement and  pacemaker placement. History of tobacco use  EXAM: CHEST  2 VIEW  COMPARISON:  Portable chest x-ray dated Sep 22, 2008  FINDINGS: The lungs are adequately inflated. The interstitial markings are coarse increased with areas of confluence inferiorly in the right lung. The cardiac silhouette is top-normal in size. The pulmonary vascularity is not clearly engorged. The patient has undergone previous CABG. A permanent pacemaker defibrillator is in reasonable position radiographically. The bony thorax is unremarkable.  IMPRESSION: Increased density specially in the right lower lobe is worrisome for pneumonia. One cannot exclude underlying low-grade CHF. Followup films following therapy are recommended to assure clearing.   Electronically Signed   By: David  [place]   On: 02/06/2014 13:32    EKG: Independently reviewed.   Assessment/Plan Principal Problem:   Acute respiratory failure with hypoxia Active Problems:   Atrial fibrillation   Fever   CAP (community acquired pneumonia)   UTI (lower urinary tract infection)    Acute respiratory failure with  hypoxia -This is likely secondary to right lower lobe pneumonia. -Patient has history of smoking probably has a degree of undiagnosed COPD. -Provide supplemental oxygen, treat community acquired pneumonia and follow closely.  Community acquired pneumonia -RLL pneumonia, patient started on Rocephin and azithromycin in the emergency department, I will continue. -Obtain blood and sputum cultures. -Provide supportive management with bronchodilators, mucolytics and oxygen.  Atrial fibrillation -Reported history of atrial fibrillation and VT, patient follows with Dr. Graciela Husbands. -Patient is on atenolol, amiodarone and digoxin, continue. -Patient is on Coumadin continue, I have asked pharmacy to dose it.  UTI -Urinalysis consistent with UTI, patient is on Rocephin for his pneumonia. -Follow urine culture.  Chronic systolic CHF -2-D echocardiogram in May of 2015 showed ejection fraction of 35-40%. -ProBNP slightly elevated, patient does not have symptoms or signs suggesting decompensation. -We will give one dose of IV Lasix to keep intake and output in the negative side.  Code Status: Full code Family Communication: Plan discussed with the patient in the presence of his wife at bedside Disposition Plan: Inpatient  Time spent: 70 minutes  [facility] Pager [phone]

## 2014-02-06 NOTE — ED Notes (Signed)
O2 increased to 4L/[place] from 3L/[place].

## 2014-02-06 NOTE — ED Notes (Signed)
Called to [facility] to give report.  Receiving RN unable to get report at this time.

## 2014-02-06 NOTE — Progress Notes (Signed)
Utilization Review completed.  Eleyna Brugh RN CM  

## 2014-02-06 NOTE — Progress Notes (Signed)
ANTICOAGULATION CONSULT NOTE - Initial Consult  Pharmacy Consult for Warfarin Indication: atrial fibrillation, Hx DVT  No Known Allergies  Patient Measurements:    Vital Signs: Temp: 98.5 F (36.9 C) (09/17 1211) Temp src: Oral (09/17 1211) BP: 120/64 mmHg (09/17 1530) Pulse Rate: 62 (09/17 1530)  Labs:  Recent Labs  02/06/14 1310  HGB 11.7*  HCT 36.2*  PLT 287  LABPROT 24.9*  INR 2.25*  CREATININE 1.18  TROPONINI <0.30    The CrCl is unknown because both a height and weight (above a minimum accepted value) are required for this calculation.   Medical History: Past Medical History  Diagnosis Date  . DVT (deep venous thrombosis)   . Ischemic cardiomyopathy     CABG vx4 1986 and PTCA in 1991; stent 1997 cath 2008  . Dyslipidemia   . HTN (hypertension)   . Atrial fibrillation   . Ventricular tachycardia     With prior shock therapy and antitachycardia pacing  . Automatic implantable cardiac defibrillator St Judes 3/08    St. Jude  . History of colonoscopy 10/14/2002  . Coronary artery disease   . Fatigue 03/29/2012  . Arthritis     Medications:  Scheduled:  . [START ON 02/07/2014] Influenza vac split quadrivalent PF  0.5 mL Intramuscular Tomorrow-1000   Infusions:  . sodium chloride Stopped (02/06/14 1459)  . sodium chloride 100 mL/hr at 02/06/14 1501    Assessment: 80 yoM admitted on 9/17 with CAP and UTI.  His PMH includes atrial fibrillation and DVT for which he is on chronic warfarin anticoagulation.  Prior to admission warfarin dosing was 2mg  daily except 1mg  on Mon and Fri.  INR 2.25 is therapeutic on admission.  Pharmacy is consulted to continue warfarin dosing inpatient.  9/17  INR 2.25, therapeutic  CBC: Hgb 11.7, Plt 287  Drug interactions: Azithromycin and ceftriaxone may contribute to prolonged INR.  Amiodarone may increase INR, but was continued from home and appears to be well compensated for at this time.  Diet: regular  Goal of  Therapy:  INR 2-3 Monitor platelets by anticoagulation protocol: Yes   Plan:   Warfarin 2mg  PO tonight at 1800   Daily INR  Lynann Beaver PharmD, BCPS Pager [phone] 02/06/2014 4:34 PM

## 2014-02-06 NOTE — Progress Notes (Signed)
  CARE MANAGEMENT ED NOTE 02/06/2014  Patient:  [name], [name]   Account Number:  [id number]  Date Initiated:  02/06/2014  Documentation initiated by:  Radford Pax  Subjective/Objective Assessment:   Patient presents to the ED with general malaise and shortness of breath.     Subjective/Objective Assessment Detail:   Pulse ox on room air per EMS was 77%.  Chest xray: Increased density specially in the right lower lobe is worrisome for  pneumonia. One cannot exclude underlying low-grade CHF. Followup  films following therapy are recommended to assure clearing. Patient on 3 liters of oxygen in the ED.     Action/Plan:   Action/Plan Detail:   Anticipated DC Date:       Status Recommendation to Physician:   Result of Recommendation:    Other ED Services  Consult Working Plan    DC [employer]  Other    Choice offered to / List presented to:  C-3 Spouse          Status of service:  Completed, signed off  ED Comments:   ED Comments Detail:  EDCM spoke to patient and his wife [name] at bedside [phone].  Patient lives at home with his wife. Patient does not have any homehealth services or medical equipment at home at this time.  Patient does NOT wear oxygen at home.  Patient reports he is usually able to perform his ADL's without difficulty.  Patient confirms his pcp is Dr. Ellwood Sayers of [place].  Patient's cardiologist is Dr. Loretta Plume.  EDCM provided patient with list of home health agencies in [facility] [place] [facility].  EDCM explained to patient and his wife that with home health, patient may receive a visiting RN, PT, OT, aide and social worker if needed.  Patient and his wife thankful for resources.  No further EDCM needs at this time.

## 2014-02-06 NOTE — Progress Notes (Signed)
   Subjective:    Patient ID: [name], male    DOB: [birth year], 80 y.o.   MRN: [id number]  HPI  Patient is seen in office today for 3 week history of progressive fatigue, muscle weakness, Increasing shortness of breath. Complains of extreme sleepiness.  Non productive cough.  Poor appetitie. Lightheadedness with position changes.  Reports temperature of 101.6 three days ago. C/o fever and chills and dry heaves    Review of Systems  Constitutional: Positive for fever, chills, appetite change and fatigue.  HENT: Negative.  Negative for congestion, postnasal drip and sinus pressure.   Eyes: Negative.   Respiratory: Positive for cough and shortness of breath. Negative for chest tightness and wheezing.   Cardiovascular: Negative.  Negative for chest pain, palpitations and leg swelling.  Gastrointestinal: Negative.   Genitourinary: Negative.  Negative for dysuria, frequency, hematuria and flank pain.  Skin: Negative.   Psychiatric/Behavioral: Negative.        Objective:   Physical Exam  Constitutional: He is oriented to person, place, and time. No distress.  Appears frail O2 sat 77-81 in office.  HENT:  Head: Normocephalic.  Right Ear: External ear normal.  Eyes: Pupils are equal, round, and reactive to light.  Cardiovascular: Normal rate.   Pulmonary/Chest: He has no wheezes. He has rales.  Fine rales in bases bilaterally  Abdominal: Soft. Bowel sounds are normal. He exhibits no distension and no mass. There is no tenderness. There is no guarding.  Lymphadenopathy:    He has no cervical adenopathy.  Neurological: He is alert and oriented to person, place, and time.  Skin: Skin is warm and dry. He is not diaphoretic.  Psychiatric: He has a normal mood and affect.          Assessment & Plan:  1. Shortness of breath O2 sat 77-81  Discussed patient symptoms with Dr. Yetta Barre.  Patient to be direct admitt to [facility].  DX Pneumonia possible sepsis.  2. Other  malaise and fatigue 3. Pneumonia, organism unspecified  911 called. Patient transported to [facility] for evaluation and treatment.  Wife was informed of patient's health status and hospitalization.

## 2014-02-06 NOTE — ED Provider Notes (Signed)
CSN: [id number]     Arrival date & time 02/06/14  1207 History   First MD Initiated Contact with Patient 02/06/14 1227     Chief Complaint  Patient presents with  . Shortness of Breath     (Consider location/radiation/quality/duration/timing/severity/associated sxs/prior Treatment) Patient is a 80 y.o. male presenting with shortness of breath. The history is provided by the patient.  Shortness of Breath Associated symptoms: cough and fever   Associated symptoms: no abdominal pain, no chest pain, no headaches, no neck pain, no rash, no sore throat and no vomiting   pt with hx cad, cabg, pacemaker/debif, ischemic CM, c/o sob and generalized weakness for the past couple weeks. Gradual onset, slowly progressive, worse this week. Is worse w activity or exertion. No associated cp or discomfort, nv or diaphoresis. No palpitations. defib has not fired. No syncope. Denies increased leg edema or pain. No orthopnea or pnd. Compliant w normal meds, no recent change in meds or doses. +non prod cough. No sore throat, runny nose or other uri c/o. Had fever 101 a few days ago. No dysuria or gu c/o.      Past Medical History  Diagnosis Date  . DVT (deep venous thrombosis)   . Ischemic cardiomyopathy     CABG vx4 1986 and PTCA in 1991; stent 1997 cath 2008  . Dyslipidemia   . HTN (hypertension)   . Atrial fibrillation   . Ventricular tachycardia     With prior shock therapy and antitachycardia pacing  . Automatic implantable cardiac defibrillator St Judes 3/08    St. Jude  . History of colonoscopy 10/14/2002  . Coronary artery disease   . Fatigue 03/29/2012  . Arthritis    Past Surgical History  Procedure Laterality Date  . Cardiac defibrillator placement  3/08    St. Jude  . Ptca  1997  . Coronary artery bypass graft  1996    last cath reports an atretic LIMA to the LAD and patent SVG to RCA   Family History  Problem Relation Age of Onset  . Diabetes    . Heart disease Father   . Ulcers  Father   . Hyperlipidemia Father   . Hypertension Father   . Cancer Brother     lung cancer  . Cancer Brother     throat cancer   History  Substance Use Topics  . Smoking status: Former Smoker    Types: Cigars  . Smokeless tobacco: Never Used     Comment: quit smoking cigerettes in 1996  . Alcohol Use: No     Comment: no alcohol in about a month    Review of Systems  Constitutional: Positive for fever. Negative for chills.  HENT: Negative for sore throat.   Eyes: Negative for redness.  Respiratory: Positive for cough and shortness of breath.   Cardiovascular: Negative for chest pain and leg swelling.  Gastrointestinal: Negative for vomiting, abdominal pain and diarrhea.  Endocrine: Negative for polyuria.  Genitourinary: Negative for dysuria and flank pain.  Musculoskeletal: Negative for back pain and neck pain.  Skin: Negative for rash.  Neurological: Negative for headaches.  Hematological: Does not bruise/bleed easily.  Psychiatric/Behavioral: Negative for confusion.      Allergies  Review of patient's allergies indicates no known allergies.  Home Medications   Prior to Admission medications   Medication Sig Start Date End Date Taking? Authorizing Provider  amiodarone (PACERONE) 200 MG tablet Take 1 tablet (200 mg total) by mouth daily. 11/29/13   Viviann Spare  Anabel Halon, MD  atenolol (TENORMIN) 100 MG tablet Take 0.5 tablets (50 mg total) by mouth daily. 01/07/14   Duke Salvia, MD  clobetasol cream (TEMOVATE) 0.05 % Apply 1 application topically daily.  11/23/11   Historical Provider, MD  digoxin (LANOXIN) 0.125 MG tablet Take 0.125 mg by mouth every other day.    Historical Provider, MD  fish oil-omega-3 fatty acids 1000 MG capsule Take 1 g by mouth 2 (two) times daily.     Historical Provider, MD  Flavocoxid-Cit Zn Bisglcinate (LIMBREL500) 500-50 MG CAPS Take 1 capsule by mouth 2 (two) times daily. 12/20/13   Etta Grandchild, MD  Multiple Vitamin (MULTIVITAMIN) tablet Take 1  tablet by mouth daily.     Historical Provider, MD  nitroGLYCERIN (NITROSTAT) 0.4 MG SL tablet Place 0.4 mg under the tongue every 5 (five) minutes as needed for chest pain.     Historical Provider, MD  ramipril (ALTACE) 5 MG capsule Take 1 capsule (5 mg total) by mouth daily. 11/29/13   Duke Salvia, MD  rosuvastatin (CRESTOR) 40 MG tablet Take 1 tablet (40 mg total) by mouth daily. STOP SIMVASTATIN 05/31/13   Duke Salvia, MD  traMADol (ULTRAM) 50 MG tablet Take 1 tablet (50 mg total) by mouth every 8 (eight) hours as needed. 12/17/13   Etta Grandchild, MD  warfarin (COUMADIN) 2 MG tablet TAKE AS DIRECTED BY COUMADIN CLINIC 12/02/13   Duke Salvia, MD   BP 130/61  Pulse 60  Temp(Src) 98.5 F (36.9 C) (Oral)  Resp 18  SpO2 94% Physical Exam  Nursing note and vitals reviewed. Constitutional: He is oriented to person, place, and time. He appears well-developed and well-nourished. No distress.  HENT:  Mouth/Throat: Oropharynx is clear and moist.  Eyes: Conjunctivae are normal. Pupils are equal, round, and reactive to light.  Neck: Normal range of motion. Neck supple. No tracheal deviation present.  Cardiovascular: Normal rate, regular rhythm, normal heart sounds and intact distal pulses.  Exam reveals no gallop and no friction rub.   No murmur heard. Pulmonary/Chest: No accessory muscle usage. No respiratory distress. He has rales.  Abdominal: Soft. Bowel sounds are normal. He exhibits no distension and no mass. There is no tenderness. There is no rebound and no guarding.  Genitourinary:  No cva tenderness  Musculoskeletal: Normal range of motion. He exhibits no edema and no tenderness.  Neurological: He is alert and oriented to person, place, and time.  Skin: Skin is warm and dry. No rash noted. He is not diaphoretic.  Psychiatric: He has a normal mood and affect.    ED Course  Procedures (including critical care time) Labs Review   Results for orders placed during the hospital  encounter of 02/06/14  CBC      Result Value Ref Range   WBC 9.3  4.0 - 10.5 K/uL   RBC 3.98 (*) 4.22 - 5.81 MIL/uL   Hemoglobin 11.7 (*) 13.0 - 17.0 g/dL   HCT 91.6 (*) 60.6 - 00.4 %   MCV 91.0  78.0 - 100.0 fL   MCH 29.4  26.0 - 34.0 pg   MCHC 32.3  30.0 - 36.0 g/dL   RDW 59.9  77.4 - 14.2 %   Platelets 287  150 - 400 K/uL  COMPREHENSIVE METABOLIC PANEL      Result Value Ref Range   Sodium 135 (*) 137 - 147 mEq/L   Potassium 4.4  3.7 - 5.3 mEq/L   Chloride 99  96 - 112 mEq/L   CO2 23  19 - 32 mEq/L   Glucose, Bld 107 (*) 70 - 99 mg/dL   BUN 26 (*) 6 - 23 mg/dL   Creatinine, Ser 7.56  0.50 - 1.35 mg/dL   Calcium 9.5  8.4 - 43.3 mg/dL   Total Protein 7.1  6.0 - 8.3 g/dL   Albumin 2.9 (*) 3.5 - 5.2 g/dL   AST 41 (*) 0 - 37 U/L   ALT 28  0 - 53 U/L   Alkaline Phosphatase 90  39 - 117 U/L   Total Bilirubin 0.6  0.3 - 1.2 mg/dL   GFR calc non Af Amer 57 (*) >90 mL/min   GFR calc Af Amer 66 (*) >90 mL/min   Anion gap 13  5 - 15  PRO B NATRIURETIC PEPTIDE      Result Value Ref Range   Pro B Natriuretic peptide (BNP) 1906.0 (*) 0 - 450 pg/mL  TROPONIN I      Result Value Ref Range   Troponin I <0.30  <0.30 ng/mL  PROTIME-INR      Result Value Ref Range   Prothrombin Time 24.9 (*) 11.6 - 15.2 seconds   INR 2.25 (*) 0.00 - 1.49  URINALYSIS, ROUTINE W REFLEX MICROSCOPIC      Result Value Ref Range   Color, Urine AMBER (*) YELLOW   APPearance CLOUDY (*) CLEAR   Specific Gravity, Urine 1.025  1.005 - 1.030   pH 5.5  5.0 - 8.0   Glucose, UA NEGATIVE  NEGATIVE mg/dL   Hgb urine dipstick SMALL (*) NEGATIVE   Bilirubin Urine SMALL (*) NEGATIVE   Ketones, ur NEGATIVE  NEGATIVE mg/dL   Protein, ur 295 (*) NEGATIVE mg/dL   Urobilinogen, UA 1.0  0.0 - 1.0 mg/dL   Nitrite POSITIVE (*) NEGATIVE   Leukocytes, UA MODERATE (*) NEGATIVE  LACTIC ACID, PLASMA      Result Value Ref Range   Lactic Acid, Venous 1.6  0.5 - 2.2 mmol/L  DIGOXIN LEVEL      Result Value Ref Range   Digoxin  Level 0.6 (*) 0.8 - 2.0 ng/mL  URINE MICROSCOPIC-ADD ON      Result Value Ref Range   Squamous Epithelial / LPF RARE  RARE   WBC, UA 11-20  <3 WBC/hpf   RBC / HPF 0-2  <3 RBC/hpf   Bacteria, UA MANY (*) RARE   Crystals CA OXALATE CRYSTALS (*) NEGATIVE   Dg Chest 2 View  02/06/2014   CLINICAL DATA:  Shortness of breath and 15 with anorexia for 3 weeks; history of previous CABG and stent placement and pacemaker placement. History of tobacco use  EXAM: CHEST  2 VIEW  COMPARISON:  Portable chest x-ray dated Sep 22, 2008  FINDINGS: The lungs are adequately inflated. The interstitial markings are coarse increased with areas of confluence inferiorly in the right lung. The cardiac silhouette is top-normal in size. The pulmonary vascularity is not clearly engorged. The patient has undergone previous CABG. A permanent pacemaker defibrillator is in reasonable position radiographically. The bony thorax is unremarkable.  IMPRESSION: Increased density specially in the right lower lobe is worrisome for pneumonia. One cannot exclude underlying low-grade CHF. Followup films following therapy are recommended to assure clearing.   Electronically Signed   By: David  [place]   On: 02/06/2014 13:32      EKG Interpretation   Date/Time:  Thursday February 06 2014 12:12:12 EDT Ventricular Rate:  65 PR Interval:  136 QRS Duration: 193 QT Interval:  472 QTC Calculation: 491 R Axis:   4 Text Interpretation:  Atrial-paced rhythm Left bundle branch block No  significant change since last tracing Confirmed by Denton Lank  MD, Caryn Bee  ([id number]) on 02/06/2014 12:33:56 PM      MDM  Iv ns. Continuous pulse ox and monitor. o2 [place]. Ecg. Cxr. Labs.  Reviewed nursing notes and prior charts for additional history.   cxr c/w cap, pt from home, no recent admissions.  Rocephin and zithromax iv.  ua returns, also pos, u cx.  Discussed w [facility] - will admit. States temp orders.      Suzi Roots, MD 02/06/14  1440

## 2014-02-07 DIAGNOSIS — Z95810 Presence of automatic (implantable) cardiac defibrillator: Secondary | ICD-10-CM

## 2014-02-07 DIAGNOSIS — E43 Unspecified severe protein-calorie malnutrition: Secondary | ICD-10-CM | POA: Insufficient documentation

## 2014-02-07 LAB — CBC
HCT: 32.8 % — ABNORMAL LOW (ref 39.0–52.0)
HEMOGLOBIN: 10.8 g/dL — AB (ref 13.0–17.0)
MCH: 30.3 pg (ref 26.0–34.0)
MCHC: 32.9 g/dL (ref 30.0–36.0)
MCV: 91.9 fL (ref 78.0–100.0)
Platelets: 244 10*3/uL (ref 150–400)
RBC: 3.57 MIL/uL — AB (ref 4.22–5.81)
RDW: 13.6 % (ref 11.5–15.5)
WBC: 7.1 10*3/uL (ref 4.0–10.5)

## 2014-02-07 LAB — HIV ANTIBODY (ROUTINE TESTING W REFLEX): HIV: NONREACTIVE

## 2014-02-07 LAB — BASIC METABOLIC PANEL
ANION GAP: 12 (ref 5–15)
BUN: 28 mg/dL — AB (ref 6–23)
CHLORIDE: 99 meq/L (ref 96–112)
CO2: 26 meq/L (ref 19–32)
Calcium: 8.8 mg/dL (ref 8.4–10.5)
Creatinine, Ser: 1.3 mg/dL (ref 0.50–1.35)
GFR calc Af Amer: 59 mL/min — ABNORMAL LOW (ref >90)
GFR calc non Af Amer: 51 mL/min — ABNORMAL LOW (ref >90)
Glucose, Bld: 102 mg/dL — ABNORMAL HIGH (ref 70–99)
POTASSIUM: 4.3 meq/L (ref 3.7–5.3)
Sodium: 137 mEq/L (ref 137–147)

## 2014-02-07 LAB — PROTIME-INR
INR: 2.75 — AB (ref 0.00–1.49)
Prothrombin Time: 29.1 seconds — ABNORMAL HIGH (ref 11.6–15.2)

## 2014-02-07 LAB — EXPECTORATED SPUTUM ASSESSMENT W REFEX TO RESP CULTURE

## 2014-02-07 LAB — EXPECTORATED SPUTUM ASSESSMENT W GRAM STAIN, RFLX TO RESP C

## 2014-02-07 LAB — STREP PNEUMONIAE URINARY ANTIGEN: STREP PNEUMO URINARY ANTIGEN: NEGATIVE

## 2014-02-07 MED ORDER — ALBUTEROL SULFATE (2.5 MG/3ML) 0.083% IN NEBU: 2.5000 mg | INHALATION_SOLUTION | RESPIRATORY_TRACT | Status: DC | PRN

## 2014-02-07 MED ORDER — WARFARIN SODIUM 1 MG PO TABS
1.0000 mg | ORAL_TABLET | Freq: Once | ORAL | Status: AC
  Administered 2014-02-07: 1 mg via ORAL
  Filled 2014-02-07: qty 1

## 2014-02-07 MED ORDER — ENSURE COMPLETE PO LIQD
237.0000 mL | Freq: Two times a day (BID) | ORAL | Status: DC
Start: 2014-02-07 — End: 2014-02-08
  Administered 2014-02-07 – 2014-02-08 (×3): 237 mL via ORAL

## 2014-02-07 MED ORDER — IPRATROPIUM-ALBUTEROL 0.5-2.5 (3) MG/3ML IN SOLN
3.0000 mL | Freq: Three times a day (TID) | RESPIRATORY_TRACT | Status: DC
  Administered 2014-02-07 – 2014-02-08 (×4): 3 mL via RESPIRATORY_TRACT
  Filled 2014-02-07 (×5): qty 3

## 2014-02-07 NOTE — Progress Notes (Signed)
ANTICOAGULATION CONSULT NOTE - Follow Up Consult  Pharmacy Consult for Warfarin Indication: atrial fibrillation, Hx DVT  No Known Allergies  Patient Measurements:    Vital Signs: Temp: 98.8 F (37.1 C) (09/17 2122) Temp src: Oral (09/17 2122) BP: 95/43 mmHg (09/17 2122) Pulse Rate: 66 (09/17 2122)  Labs:  Recent Labs  02/06/14 1310 02/07/14 0500  HGB 11.7* 10.8*  HCT 36.2* 32.8*  PLT 287 244  LABPROT 24.9* 29.1*  INR 2.25* 2.75*  CREATININE 1.18 1.30  TROPONINI <0.30  --     The CrCl is unknown because both a height and weight (above a minimum accepted value) are required for this calculation.   Medical History: Past Medical History  Diagnosis Date  . DVT (deep venous thrombosis)   . Ischemic cardiomyopathy     CABG vx4 1986 and PTCA in 1991; stent 1997 cath 2008  . Dyslipidemia   . HTN (hypertension)   . Atrial fibrillation   . Ventricular tachycardia     With prior shock therapy and antitachycardia pacing  . Automatic implantable cardiac defibrillator St Judes 3/08    St. Jude  . History of colonoscopy 10/14/2002  . Coronary artery disease   . Fatigue 03/29/2012  . Arthritis     Medications:  Scheduled:  . amiodarone  200 mg Oral Daily  . atenolol  50 mg Oral Daily  . atorvastatin  20 mg Oral q1800  . azithromycin  500 mg Intravenous Q24H  . cefTRIAXone (ROCEPHIN)  IV  1 g Intravenous Q24H  . digoxin  0.125 mg Oral QODAY  . guaiFENesin  1,200 mg Oral BID  . ipratropium-albuterol  3 mL Nebulization TID  . ramipril  5 mg Oral Daily  . Warfarin - Pharmacist Dosing Inpatient   Does not apply q1800   Infusions:  . sodium chloride 10 mL/hr at 02/06/14 1700   Inpatient warfarin doses administered: 9/17: 2 mg  Assessment: 46 yoM admitted on 9/17 with CAP and UTI.  His PMH includes atrial fibrillation and DVT for which he is on chronic warfarin anticoagulation.  Prior to admission warfarin dosing was 2mg  daily except 1mg  on Mon and Fri.  INR was  therapeutic on admission.  Pharmacy was consulted to continue warfarin dosing inpatient.  9/18  INR therapeutic but rising.  Due for lower warfarin dosage today per home regimen.  Slight Hgb drop, possibly dilutional.  Drug interactions: Azithromycin and ceftriaxone may contribute to prolonged INR.  Amiodarone may increase INR, but was continued from home and appears to be well compensated for at this time.  Diet: regular, charted as taking 100% dinner last night.  Goal of Therapy:  INR 2-3    Plan:   Warfarin 1mg  PO tonight at 1800   Daily INR  Elie Goody, PharmD, BCPS Pager: [phone] 02/07/2014  7:01 AM

## 2014-02-07 NOTE — Progress Notes (Signed)
INITIAL NUTRITION ASSESSMENT  Pt meets criteria for severe MALNUTRITION in the context of chronic illness as evidenced by <75% estimated energy intake with 6% weight loss in the past month.  DOCUMENTATION CODES Per approved criteria  -Severe malnutrition in the context of chronic illness   INTERVENTION: - Ensure Complete BID - Encouraged increased meal intake - Emphasized sodium restriction for CHF - RD to continue to monitor  NUTRITION DIAGNOSIS: Increased nutrient needs related to unintended weight loss as evidenced by pt report.   Goal: Pt to consume >90% of meals/supplements  Monitor:  Weights, labs, intake  Reason for Assessment: Malnutrition screening tool   80 y.o. male  Admitting Dx: Acute respiratory failure with hypoxia  ASSESSMENT: Pt discussed during multidisciplinary rounds. Pt with past medical history of atrial fibrillation, HTN and ventricular tachycardia came in to the hospital complaining about shortness of breath. Patient said for the past several weeks he was not feeling okay, having generalized aches and pains, easy fatigability and shortness of breath. For the past week or 2 he started to get fever and mild cough which is nonproductive. Today he went to see his primary care physician and was found to be hypoxic with oxygen saturation in the 70s so he was sent to the ED for further evaluation. He also reported fever of 101.34F at home. Found to have right-sided PNA, urinalysis consistent with mild UTI.  - Met with pt who reports barely eating anything for the past week other than some Amish bread one morning - Before then he was eating 2 meals/day - a late breakfast and then dinner around 6pm of meals like soups and sandwiches - Not on any nutritional supplements at home - [place] he ate 50% of his breakfast this morning - Denies any edema from CHF - Reports 12 pound unintended weight loss in the past 3 weeks  Nutrition Focused Physical Exam:  Subcutaneous  Fat:  Orbital Region: wnl Upper Arm Region: wnl Thoracic and Lumbar Region: wnl  Muscle:  Temple Region: wnl Clavicle Bone Region: wnl Clavicle and Acromion Bone Region: wnl Scapular Bone Region: NA Dorsal Hand: wnl Patellar Region: wnl Anterior Thigh Region: wnl Posterior Calf Region: wnl  Edema: none noted    Height: Ht Readings from Last 1 Encounters:  02/06/14 _0  (1.778 m)    Weight: Wt Readings from Last 1 Encounters:  02/06/14 203 lb 8 oz (92.307 kg)    Ideal Body Weight: 166 lbs  % Ideal Body Weight: 122%  Wt Readings from Last 10 Encounters:  02/06/14 203 lb 8 oz (92.307 kg)  01/07/14 216 lb (97.977 kg)  12/17/13 217 lb (98.431 kg)  10/07/13 218 lb (98.884 kg)  10/07/13 218 lb (98.884 kg)  09/30/13 218 lb (98.884 kg)  09/26/13 217 lb (98.431 kg)  09/19/13 210 lb (95.255 kg)  02/21/13 209 lb (94.802 kg)  12/26/12 209 lb 6.4 oz (94.983 kg)    Usual Body Weight: 215 lbs  % Usual Body Weight: 94%  BMI:  29.2 kg/(m^2)  Estimated Nutritional Needs: Kcal: 1900-2100 Protein: 90-110g Fluid: 1.9-2.1L/day   Skin: intact  Diet Order: Cardiac  EDUCATION NEEDS: -Education needs addressed - reviewed importance of sodium restriction for CHF   Intake/Output Summary (Last 24 hours) at 02/07/14 1011 Last data filed at 02/07/14 0500  Gross per 24 hour  Intake    480 ml  Output    375 ml  Net    105 ml    Last BM: PTA  Labs:  Recent Labs Lab 02/06/14 1310 02/07/14 0500  NA 135* 137  K 4.4 4.3  CL 99 99  CO2 23 26  BUN 26* 28*  CREATININE 1.18 1.30  CALCIUM 9.5 8.8  GLUCOSE 107* 102*    CBG (last 3)  No results found for this basename: GLUCAP,  in the last 72 hours  Scheduled Meds: . amiodarone  200 mg Oral Daily  . atenolol  50 mg Oral Daily  . atorvastatin  20 mg Oral q1800  . azithromycin  500 mg Intravenous Q24H  . cefTRIAXone (ROCEPHIN)  IV  1 g Intravenous Q24H  . digoxin  0.125 mg Oral QODAY  . guaiFENesin  1,200 mg  Oral BID  . ipratropium-albuterol  3 mL Nebulization TID  . ramipril  5 mg Oral Daily  . warfarin  1 mg Oral ONCE-1800  . Warfarin - Pharmacist Dosing Inpatient   Does not apply q1800    Continuous Infusions: . sodium chloride 10 mL/hr at 02/06/14 1700    Past Medical History  Diagnosis Date  . DVT (deep venous thrombosis)   . Ischemic cardiomyopathy     CABG vx4 1986 and PTCA in 1991; stent 1997 cath 2008  . Dyslipidemia   . HTN (hypertension)   . Atrial fibrillation   . Ventricular tachycardia     With prior shock therapy and antitachycardia pacing  . Automatic implantable cardiac defibrillator St Judes 3/08    St. Jude  . History of colonoscopy 10/14/2002  . Coronary artery disease   . Fatigue 03/29/2012  . Arthritis     Past Surgical History  Procedure Laterality Date  . Cardiac defibrillator placement  3/08    St. Jude  . Ptca  1997  . Coronary artery bypass graft  1996    last cath reports an atretic LIMA to the LAD and patent SVG to [place], [place], [place] Pager [phone] Weekend/After Hours Pager

## 2014-02-07 NOTE — Progress Notes (Signed)
Patient ID: [name], male   DOB: February 02, 1934, 80 y.o.   MRN: [id number] [facility] PROGRESS NOTE  [name]:[id number] DOB: Sep 30, 1933 DOA: 02/06/2014 PCP: Sanda Linger, MD  Brief narrative: 80 y.o. male with past medical history of atrial fibrillation on anticoagulation with coumadin, also on digoxin, HTN and ventricular tachycardia status post pacemaker and ICD who presented to [facility] ED 02/06/2014 with worsening shortness of breath for past 24 hours prior to this admission. Pt also noted to be hypoxic with oxygen saturation in 70's. CXR on admission concerning fot right side pneumonia. Pt started on azithromycin and rocephin. Pt laso had urinalysis consistent with UTI.  Assessment/Plan:    Principal Problem:   Acute respiratory failure with hypoxia / Community acquired pneumonia  Secondary to community acquired pneumonia  Respiratory status is stable. Oxygen saturation is 91% on 2 L [place] oxygen support.  Follow up blood culture results, resp culture results.   Continue azithromycin and rocephin for community acquired pneumonia  Continue nebulizer treatment with Duoneb TID and then albuterol and Atrovent every 4 hours PRN Active Problems:   Atrial fibrillation  Continue digoxin and amiodarone for rate control.  Coumadin per pharmacy dosing   Hypertension  Continue atenolol and ramipril   UTI (lower urinary tract infection)  Continue rocephin   Follow up urine culture results   Dyslipidemia  Continue statin therapy   Protein calorie malnutrition, mild  Pt ate whole breakfast this am. His weight is stable at 92 kg. I do not agree with severe protein calorie malnutrition.   DVT Prophylaxis   Coumadin per pharmacy dosing  Code Status: Full.  Family Communication:  plan of care discussed with the patient Disposition Plan: Home when stable.    IV Access:   Peripheral IV Procedures and diagnostic studies:    Dg Chest 2 View 02/06/2014  Increased  density specially in the right lower lobe is worrisome for pneumonia. One cannot exclude underlying low-grade CHF. Followup films following therapy are recommended to assure clearing.    Medical Consultants:   None  Other Consultants:   None  Anti-Infectives:   Azithromycin 02/06/2014 --> Rocephin 02/06/2014 -->   Manson Passey, MD  [facility] Pager [phone]  If 7PM-7AM, please contact night-coverage [email or web address] Password [email or web address] 02/07/2014, 8:04 AM   LOS: 1 day    HPI/Subjective: No acute overnight events.  Objective: Filed Vitals:   02/06/14 1954 02/06/14 2122 02/07/14 0600 02/07/14 0756  BP:  95/43 120/72   Pulse:  66 68   Temp:  98.8 F (37.1 C) 98.4 F (36.9 C)   TempSrc:  Oral Oral   Resp:  22 20   SpO2: 92% 92% 93% 95%    Intake/Output Summary (Last 24 hours) at 02/07/14 0804 Last data filed at 02/07/14 0500  Gross per 24 hour  Intake    480 ml  Output    375 ml  Net    105 ml    Exam:   General:  Pt is alert, follows commands appropriately, not in acute distress  Cardiovascular: irregular rhythm, rate controlled, S1/S2 appreciated, has pacemaker and ICD  Respiratory: Clear to auscultation bilaterally, no wheezing  Abdomen: Soft, non tender, non distended, bowel sounds present  Extremities: Pulses DP and PT palpable bilaterally  Neuro: Grossly nonfocal  Data Reviewed: Basic Metabolic Panel:  Recent Labs Lab 02/06/14 1310 02/07/14 0500  NA 135* 137  K 4.4 4.3  CL 99 99  CO2 23 26  GLUCOSE 107* 102*  BUN 26* 28*  CREATININE 1.18 1.30  CALCIUM 9.5 8.8   Liver Function Tests:  Recent Labs Lab 02/06/14 1310  AST 41*  ALT 28  ALKPHOS 90  BILITOT 0.6  PROT 7.1  ALBUMIN 2.9*   No results found for this basename: LIPASE, AMYLASE,  in the last 168 hours No results found for this basename: AMMONIA,  in the last 168 hours CBC:  Recent Labs Lab 02/06/14 1310 02/07/14 0500  WBC 9.3 7.1  HGB 11.7* 10.8*  HCT 36.2* 32.8*   MCV 91.0 91.9  PLT 287 244   Cardiac Enzymes:  Recent Labs Lab 02/06/14 1310  TROPONINI <0.30   BNP: No components found with this basename: POCBNP,  CBG: No results found for this basename: GLUCAP,  in the last 168 hours  No results found for this or any previous visit (from the past 240 hour(s)).   Scheduled Meds: . amiodarone  200 mg Oral Daily  . atenolol  50 mg Oral Daily  . atorvastatin  20 mg Oral q1800  . azithromycin  500 mg Intravenous Q24H  . cefTRIAXone  1 g Intravenous Q24H  . digoxin  0.125 mg Oral QODAY  . guaiFENesin  1,200 mg Oral BID  . ipratropium-albuterol  3 mL Nebulization TID  . ramipril  5 mg Oral Daily  . warfarin  1 mg Oral ONCE-1800   Continuous Infusions: . sodium chloride 10 mL/hr at 02/06/14 1700

## 2014-02-08 LAB — PROTIME-INR
INR: 2.73 — AB (ref 0.00–1.49)
Prothrombin Time: 28.9 seconds — ABNORMAL HIGH (ref 11.6–15.2)

## 2014-02-08 LAB — LEGIONELLA ANTIGEN, URINE: Legionella Antigen, Urine: NEGATIVE

## 2014-02-08 LAB — GLUCOSE, CAPILLARY: Glucose-Capillary: 111 mg/dL — ABNORMAL HIGH (ref 70–99)

## 2014-02-08 MED ORDER — CEFUROXIME AXETIL 500 MG PO TABS: 500.0000 mg | ORAL_TABLET | Freq: Two times a day (BID) | ORAL | Status: DC

## 2014-02-08 MED ORDER — WARFARIN SODIUM 1 MG PO TABS
1.0000 mg | ORAL_TABLET | Freq: Once | ORAL | Status: DC
  Filled 2014-02-08: qty 1

## 2014-02-08 NOTE — Progress Notes (Signed)
ANTICOAGULATION CONSULT NOTE - Follow Up  Pharmacy Consult for Warfarin Indication: atrial fibrillation, Hx DVT  No Known Allergies  Patient Measurements:    Vital Signs: Temp: 97.8 F (36.6 C) (09/19 0531) Temp src: Oral (09/19 0531) BP: 111/65 mmHg (09/19 0531) Pulse Rate: 66 (09/19 0531)  Labs:  Recent Labs  02/06/14 1310 02/07/14 0500 02/08/14 0520  HGB 11.7* 10.8*  --   HCT 36.2* 32.8*  --   PLT 287 244  --   LABPROT 24.9* 29.1* 28.9*  INR 2.25* 2.75* 2.73*  CREATININE 1.18 1.30  --   TROPONINI <0.30  --   --     The CrCl is unknown because both a height and weight (above a minimum accepted value) are required for this calculation.   Medical History: Past Medical History  Diagnosis Date  . DVT (deep venous thrombosis)   . Ischemic cardiomyopathy     CABG vx4 1986 and PTCA in 1991; stent 1997 cath 2008  . Dyslipidemia   . HTN (hypertension)   . Atrial fibrillation   . Ventricular tachycardia     With prior shock therapy and antitachycardia pacing  . Automatic implantable cardiac defibrillator [employer] 3/08    [employer]  . History of colonoscopy 10/14/2002  . Coronary artery disease   . Fatigue 03/29/2012  . Arthritis     Medications:  Scheduled:  . amiodarone  200 mg Oral Daily  . atenolol  50 mg Oral Daily  . atorvastatin  20 mg Oral q1800  . azithromycin  500 mg Intravenous Q24H  . cefTRIAXone (ROCEPHIN)  IV  1 g Intravenous Q24H  . digoxin  0.125 mg Oral QODAY  . feeding supplement (ENSURE COMPLETE)  237 mL Oral BID BM  . guaiFENesin  1,200 mg Oral BID  . ipratropium-albuterol  3 mL Nebulization TID  . ramipril  5 mg Oral Daily  . Warfarin - Pharmacist Dosing Inpatient   Does not apply q1800   Infusions:  . sodium chloride 10 mL/hr at 02/06/14 1700    Assessment: 80 yoM admitted on 9/17 with CAP and UTI.  His PMH includes atrial fibrillation and DVT for which he is on chronic warfarin anticoagulation.  Prior to admission warfarin dosing  was 2mg  daily except 1mg  on Mon and Fri.  INR was therapeutic on admission.  Pharmacy was consulted to continue warfarin dosing inpatient.  Significant events:  9/17: INR tx. Give 2 mg 9/18: INR tx but rising. Give 1mg   9/19  INR therapeutic, leveled-off.   Slight Hgb drop, possibly dilutional.  Drug interactions: Azithromycin and ceftriaxone may contribute to prolonged INR.  Amiodarone may increase INR, but was continued from home and appears to be well compensated for at this time.  Diet: regular, tolerating.  Goal of Therapy:  INR 2-3   Plan:   Warfarin 1mg  PO tonight at 1800   Daily INR  Charolotte Eke, PharmD, pager [phone]. 02/08/2014,8:49 AM.

## 2014-02-08 NOTE — Progress Notes (Signed)
Patients oxygen saturation on Room Air 94-95% while laying in bed, Ambulated in hall and oxygen saturation 92%.

## 2014-02-08 NOTE — Progress Notes (Signed)
Patient discharged to home with family via wheelchair, discharge instructions reviewed with patient who verbalized understanding. New RX given to patient. 

## 2014-02-08 NOTE — Discharge Summary (Signed)
Physician Discharge Summary  [name] RUE:[id number] DOB: [birth year] DOA: 02/06/2014  PCP: Sanda Linger, MD  Admit date: 02/06/2014 Discharge date: 02/08/2014  Recommendations for Outpatient Follow-up:  1. Please take Ceftin for 10 days on discharge which will cover for pneumonia and urinary tract infection. 2. Continue coumadin per prior home regimen. Please recheck INR in next 3 days from discharge to make sure it is within the range 2-3.  Discharge Diagnoses:  Principal Problem:   Acute respiratory failure with hypoxia Active Problems:   Atrial fibrillation   Fever   CAP (community acquired pneumonia)   UTI (lower urinary tract infection)   Protein-calorie malnutrition, severe    Discharge Condition: stable   Diet recommendation: as tolerated   History of present illness:  80 y.o. male with past medical history of atrial fibrillation on anticoagulation with coumadin, also on digoxin, HTN and ventricular tachycardia status post pacemaker and ICD who presented to [facility] ED 02/06/2014 with worsening shortness of breath for past 24 hours prior to this admission. Pt also noted to be hypoxic with oxygen saturation in 70's. CXR on admission concerning fot right side pneumonia. Pt started on azithromycin and rocephin. Pt laso had urinalysis consistent with UTI.   Assessment/Plan:   Principal Problem:  Acute respiratory failure with hypoxia / Community acquired pneumonia  Secondary to community acquired pneumonia  Respiratory status is stable. Will have pt ambulate prior to discharge and perhaps he does not need oxygen at home.   Blood cultures negative to date, strep pneumo and legionella negative  Was on azithromycin and rocephin for community acquired pneumonia. Switch to PO Ceftin for 10 days on discharge.  Pt received nebulizer treatment with Duoneb TID and albuterol and Atrovent every 4 hours PRN Active Problems:  Atrial fibrillation  Continue digoxin and amiodarone for rate  control.  Coumadin per prior home regimen. INR to be checked in 3 days after discharge.  Hypertension  Continue atenolol and ramipril UTI (lower urinary tract infection) secondary to E.Coli Continue Ceftin on discharge for 10 days. Dyslipidemia  Continue statin therapy Protein calorie malnutrition, mild  Pt ate whole breakfast. His weight is stable at 92 kg. I do not agree with severe protein calorie malnutrition. DVT Prophylaxis  Coumadin per prior home dosing.    Code Status: Full.  Family Communication: plan of care discussed with the patient   IV Access:   Peripheral IV Procedures and diagnostic studies:   Dg Chest 2 View 02/06/2014 Increased density specially in the right lower lobe is worrisome for pneumonia. One cannot exclude underlying low-grade CHF. Followup films following therapy are recommended to assure clearing.  Medical Consultants:   None  Other Consultants:   None  Anti-Infectives:   Azithromycin 02/06/2014 --> 02/08/2014 Rocephin 02/06/2014 --> 02/08/2014 Ceftin for 10 days on discharge   Signed:  Manson Passey, MD  [facility] 02/08/2014, 1:23 PM  Pager #: [phone]   Discharge Exam: Filed Vitals:   02/08/14 0531  BP: 111/65  Pulse: 66  Temp: 97.8 F (36.6 C)  Resp: 20   Filed Vitals:   02/08/14 1000 02/08/14 1025 02/08/14 1026 02/08/14 1053  BP:      Pulse:      Temp:      TempSrc:      Resp:      SpO2: 96% 89% 94% 94%    General: Pt is alert, not in acute distress Cardiovascular: S1/S2 appreciated, pacer and ICD (+) Respiratory: Clear to auscultation bilaterally, no wheezing, no crackles,  no rhonchi Abdominal: Soft, non tender, non distended, bowel sounds +, no guarding Extremities: no edema, no cyanosis, pulses palpable bilaterally DP and PT Neuro: Grossly nonfocal  Discharge Instructions  Discharge Instructions   Call MD for:  difficulty breathing, headache or visual disturbances    Complete by:  As directed      Call MD  for:  persistant dizziness or light-headedness    Complete by:  As directed      Call MD for:  persistant nausea and vomiting    Complete by:  As directed      Call MD for:  severe uncontrolled pain    Complete by:  As directed      Diet - low sodium heart healthy    Complete by:  As directed      Discharge instructions    Complete by:  As directed   1. Please take Ceftin for 10 days on discharge which will cover for pneumonia and urinary tract infection. 2. Continue coumadin per prior home regimen. Please recheck INR in next 3 days from discharge to make sure it is within the range 2-3.     Increase activity slowly    Complete by:  As directed             Medication List         amiodarone 200 MG tablet  Commonly known as:  PACERONE  Take 1 tablet (200 mg total) by mouth daily.     atenolol 100 MG tablet  Commonly known as:  TENORMIN  Take 0.5 tablets (50 mg total) by mouth daily.     cefUROXime 500 MG tablet  Commonly known as:  CEFTIN  Take 1 tablet (500 mg total) by mouth 2 (two) times daily with a meal.     clobetasol cream 0.05 %  Commonly known as:  TEMOVATE  Apply 1 application topically daily.     digoxin 0.125 MG tablet  Commonly known as:  LANOXIN  Take 0.125 mg by mouth every other day.     fish oil-omega-3 fatty acids 1000 MG capsule  Take 1 g by mouth 2 (two) times daily.     Flavocoxid-Cit Zn Bisglcinate 500-50 MG Caps  Take 1 capsule by mouth 2 (two) times daily.     multivitamin tablet  Take 1 tablet by mouth daily.     nitroGLYCERIN 0.4 MG SL tablet  Commonly known as:  NITROSTAT  Place 0.4 mg under the tongue every 5 (five) minutes as needed for chest pain.     ramipril 5 MG capsule  Commonly known as:  ALTACE  Take 1 capsule (5 mg total) by mouth daily.     rosuvastatin 40 MG tablet  Commonly known as:  CRESTOR  Take 1 tablet (40 mg total) by mouth daily. STOP SIMVASTATIN     traMADol 50 MG tablet  Commonly known as:  ULTRAM  Take 50  mg by mouth every 6 (six) hours as needed for moderate pain.     warfarin 2 MG tablet  Commonly known as:  COUMADIN  Take 1-2 mg by mouth daily at 6 PM.  on Sunday Tuesday Wednesday  Thursday Saturday. 1 mg Monday  Friday           Follow-up Information   Follow up with Sanda Linger, MD. Schedule an appointment as soon as possible for a visit in 1 week. (Follow up appt after recent hospitalization)    Specialty:  Internal Medicine   Contact  information:   [address] [place] [address] [phone]        The results of significant diagnostics from this hospitalization (including imaging, microbiology, ancillary and laboratory) are listed below for reference.    Significant Diagnostic Studies: Dg Chest 2 View  02/06/2014   CLINICAL DATA:  Shortness of breath and 15 with anorexia for 3 weeks; history of previous CABG and stent placement and pacemaker placement. History of tobacco use  EXAM: CHEST  2 VIEW  COMPARISON:  Portable chest x-ray dated Sep 22, 2008  FINDINGS: The lungs are adequately inflated. The interstitial markings are coarse increased with areas of confluence inferiorly in the right lung. The cardiac silhouette is top-normal in size. The pulmonary vascularity is not clearly engorged. The patient has undergone previous CABG. A permanent pacemaker defibrillator is in reasonable position radiographically. The bony thorax is unremarkable.  IMPRESSION: Increased density specially in the right lower lobe is worrisome for pneumonia. One cannot exclude underlying low-grade CHF. Followup films following therapy are recommended to assure clearing.   Electronically Signed   By: David  [place]   On: 02/06/2014 13:32    Microbiology: Recent Results (from the past 240 hour(s))  URINE CULTURE     Status: None   Collection Time    02/06/14  2:41 PM      Result Value Ref Range Status   Specimen Description URINE, CLEAN CATCH   Final   Special Requests NONE   Final    Culture  Setup Time     Final   Value: 02/06/2014 21:03     Performed at [facility] Count     Final   Value: >=100,000 COLONIES/ML     Performed at [facility]   Culture     Final   Value: ESCHERICHIA COLI     Performed at [facility]   Report Status PENDING   Incomplete  CULTURE, BLOOD (ROUTINE X 2)     Status: None   Collection Time    02/06/14  5:17 PM      Result Value Ref Range Status   Specimen Description BLOOD RIGHT ARM   Final   Special Requests BOTTLES DRAWN AEROBIC AND ANAEROBIC 8CC   Final   Culture  Setup Time     Final   Value: 02/06/2014 20:31     Performed at [facility]   Culture     Final   Value:        BLOOD CULTURE RECEIVED NO GROWTH TO DATE CULTURE WILL BE HELD FOR 5 DAYS BEFORE ISSUING A FINAL NEGATIVE REPORT     Performed at [facility]   Report Status PENDING   Incomplete  CULTURE, BLOOD (ROUTINE X 2)     Status: None   Collection Time    02/06/14  5:27 PM      Result Value Ref Range Status   Specimen Description BLOOD RIGHT ARM   Final   Special Requests BOTTLES DRAWN AEROBIC AND ANAEROBIC 10CC   Final   Culture  Setup Time     Final   Value: 02/06/2014 20:32     Performed at [facility]   Culture     Final   Value:        BLOOD CULTURE RECEIVED NO GROWTH TO DATE CULTURE WILL BE HELD FOR 5 DAYS BEFORE ISSUING A FINAL NEGATIVE REPORT     Performed at [facility]  Report Status PENDING   Incomplete  CULTURE, EXPECTORATED SPUTUM-ASSESSMENT     Status: None   Collection Time    02/07/14 10:02 AM      Result Value Ref Range Status   Specimen Description SPUTUM   Final   Special Requests NONE   Final   Sputum evaluation     Final   Value: MICROSCOPIC FINDINGS SUGGEST THAT THIS SPECIMEN IS NOT REPRESENTATIVE OF LOWER RESPIRATORY SECRETIONS. PLEASE RECOLLECT.     Gayland Curry RN AT 1030 ON 09.18.15 BY SHUEA   Report Status 02/07/2014 FINAL   Final     Labs: Basic  Metabolic Panel:  Recent Labs Lab 02/06/14 1310 02/07/14 0500  NA 135* 137  K 4.4 4.3  CL 99 99  CO2 23 26  GLUCOSE 107* 102*  BUN 26* 28*  CREATININE 1.18 1.30  CALCIUM 9.5 8.8   Liver Function Tests:  Recent Labs Lab 02/06/14 1310  AST 41*  ALT 28  ALKPHOS 90  BILITOT 0.6  PROT 7.1  ALBUMIN 2.9*   No results found for this basename: LIPASE, AMYLASE,  in the last 168 hours No results found for this basename: AMMONIA,  in the last 168 hours CBC:  Recent Labs Lab 02/06/14 1310 02/07/14 0500  WBC 9.3 7.1  HGB 11.7* 10.8*  HCT 36.2* 32.8*  MCV 91.0 91.9  PLT 287 244   Cardiac Enzymes:  Recent Labs Lab 02/06/14 1310  TROPONINI <0.30   BNP: BNP (last 3 results)  Recent Labs  02/06/14 1310  PROBNP 1906.0*   CBG:  Recent Labs Lab 02/08/14 0715  GLUCAP 111*    Time coordinating discharge: Over 30 minutes

## 2014-02-08 NOTE — Discharge Instructions (Signed)
Pneumonia °Pneumonia is an infection of the lungs.  °CAUSES °Pneumonia may be caused by bacteria or a virus. Usually, these infections are caused by breathing infectious particles into the lungs (respiratory tract). °SIGNS AND SYMPTOMS  °· Cough. °· Fever. °· Chest pain. °· Increased rate of breathing. °· Wheezing. °· Mucus production. °DIAGNOSIS  °If you have the common symptoms of pneumonia, your health care provider will typically confirm the diagnosis with a chest X-ray. The X-ray will show an abnormality in the lung (pulmonary infiltrate) if you have pneumonia. Other tests of your blood, urine, or sputum may be done to find the specific cause of your pneumonia. Your health care provider may also do tests (blood gases or pulse oximetry) to see how well your lungs are working. °TREATMENT  °Some forms of pneumonia may be spread to other people when you cough or sneeze. You may be asked to wear a mask before and during your exam. Pneumonia that is caused by bacteria is treated with antibiotic medicine. Pneumonia that is caused by the influenza virus may be treated with an antiviral medicine. Most other viral infections must run their course. These infections will not respond to antibiotics.  °HOME CARE INSTRUCTIONS  °· Cough suppressants may be used if you are losing too much rest. However, coughing protects you by clearing your lungs. You should avoid using cough suppressants if you can. °· Your health care provider may have prescribed medicine if he or she thinks your pneumonia is caused by bacteria or influenza. Finish your medicine even if you start to feel better. °· Your health care provider may also prescribe an expectorant. This loosens the mucus to be coughed up. °· Take medicines only as directed by your health care provider. °· Do not smoke. Smoking is a common cause of bronchitis and can contribute to pneumonia. If you are a smoker and continue to smoke, your cough may last several weeks after your  pneumonia has cleared. °· A cold steam vaporizer or humidifier in your room or home may help loosen mucus. °· Coughing is often worse at night. Sleeping in a semi-upright position in a recliner or using a couple pillows under your head will help with this. °· Get rest as you feel it is needed. Your body will usually let you know when you need to rest. °PREVENTION °A pneumococcal shot (vaccine) is available to prevent a common bacterial cause of pneumonia. This is usually suggested for: °· People over 65 years old. °· Patients on chemotherapy. °· People with chronic lung problems, such as bronchitis or emphysema. °· People with immune system problems. °If you are over 65 or have a high risk condition, you may receive the pneumococcal vaccine if you have not received it before. In some countries, a routine influenza vaccine is also recommended. This vaccine can help prevent some cases of pneumonia. You may be offered the influenza vaccine as part of your care. °If you smoke, it is time to quit. You may receive instructions on how to stop smoking. Your health care provider can provide medicines and counseling to help you quit. °SEEK MEDICAL CARE IF: °You have a fever. °SEEK IMMEDIATE MEDICAL CARE IF:  °· Your illness becomes worse. This is especially true if you are elderly or weakened from any other disease. °· You cannot control your cough with suppressants and are losing sleep. °· You begin coughing up blood. °· You develop pain which is getting worse or is uncontrolled with medicines. °· Any of the symptoms   which initially brought you in for treatment are getting worse rather than better. °· You develop shortness of breath or chest pain. °MAKE SURE YOU:  °· Understand these instructions. °· Will watch your condition. °· Will get help right away if you are not doing well or get worse. °Document Released: 05/09/2005 Document Revised: 09/23/2013 Document Reviewed: 07/29/2010 °ExitCare® Patient Information ©2015  [employer]. This information is not intended to replace advice given to you by your health care provider. Make sure you discuss any questions you have with your health care provider. ° °Urinary Tract Infection °Urinary tract infections (UTIs) can develop anywhere along your urinary tract. Your urinary tract is your body's drainage system for removing wastes and extra water. Your urinary tract includes two kidneys, two ureters, a bladder, and a urethra. Your kidneys are a pair of bean-shaped organs. Each kidney is about the size of your fist. They are located below your ribs, one on each side of your spine. °CAUSES °Infections are caused by microbes, which are microscopic organisms, including fungi, viruses, and bacteria. These organisms are so small that they can only be seen through a microscope. Bacteria are the microbes that most commonly cause UTIs. °SYMPTOMS  °Symptoms of UTIs may vary by age and gender of the patient and by the location of the infection. Symptoms in young women typically include a frequent and intense urge to urinate and a painful, burning feeling in the bladder or urethra during urination. Older women and men are more likely to be tired, shaky, and weak and have muscle aches and abdominal pain. A fever may mean the infection is in your kidneys. Other symptoms of a kidney infection include pain in your back or sides below the ribs, nausea, and vomiting. °DIAGNOSIS °To diagnose a UTI, your caregiver will ask you about your symptoms. Your caregiver also will ask to provide a urine sample. The urine sample will be tested for bacteria and white blood cells. White blood cells are made by your body to help fight infection. °TREATMENT  °Typically, UTIs can be treated with medication. Because most UTIs are caused by a bacterial infection, they usually can be treated with the use of antibiotics. The choice of antibiotic and length of treatment depend on your symptoms and the type of bacteria causing  your infection. °HOME CARE INSTRUCTIONS °· If you were prescribed antibiotics, take them exactly as your caregiver instructs you. Finish the medication even if you feel better after you have only taken some of the medication. °· Drink enough water and fluids to keep your urine clear or pale yellow. °· Avoid caffeine, tea, and carbonated beverages. They tend to irritate your bladder. °· Empty your bladder often. Avoid holding urine for long periods of time. °· Empty your bladder before and after sexual intercourse. °· After a bowel movement, women should cleanse from front to back. Use each tissue only once. °SEEK MEDICAL CARE IF:  °· You have back pain. °· You develop a fever. °· Your symptoms do not begin to resolve within 3 days. °SEEK IMMEDIATE MEDICAL CARE IF:  °· You have severe back pain or lower abdominal pain. °· You develop chills. °· You have nausea or vomiting. °· You have continued burning or discomfort with urination. °MAKE SURE YOU:  °· Understand these instructions. °· Will watch your condition. °· Will get help right away if you are not doing well or get worse. °Document Released: 02/16/2005 Document Revised: 11/08/2011 Document Reviewed: 06/17/2011 °ExitCare® Patient Information ©2015 [employer].   This information is not intended to replace advice given to you by your health care provider. Make sure you discuss any questions you have with your health care provider. ° °

## 2014-02-09 LAB — URINE CULTURE: Colony Count: >=100000

## 2014-02-10 ENCOUNTER — Telehealth: Payer: Self-pay | Admitting: *Deleted

## 2014-02-10 NOTE — Telephone Encounter (Signed)
Transition Care Management Follow-up Telephone Call How have you been since you were released from the hospital? Pt stated he is feeling wonderful   Do you understand why you were in the hospital? YES, he stated he understood why he was admitted. He is very thankful for out Nurse Practitioner Juli for sending him to hospital   Do you understand the discharge instrcutions? YES, he stated he understood his instructions  Items Reviewed:  Medications reviewed: {YES, went over all meds & made sure he is still taking the antibiotic  Allergies reviewed: {YES, no changes  Dietary changes reviewed: YES, he stated he is able to eat what he want & drink fine  Referrals reviewed: no referral   Functional Questionnaire:   Activities of Daily Living (ADLs):   He states he is independent able to get around, and do for himself       States he does not need any assistance   Any transportation issues/concerns?: NO  Any patient concerns? No pt didn't have any concerns   Confirmed importance and date/time of follow-up visits scheduled: YES, which his wife has already called this am to set up appt for Friday 02/14/14 @ 10:30   Confirmed with patient if condition begins to worsen call PCP or go to the ER.  Patient was given the Call-a-Nurse line [phone]: YES, provided call-a-nurse #

## 2014-02-12 LAB — CULTURE, BLOOD (ROUTINE X 2)
CULTURE: NO GROWTH
Culture: NO GROWTH

## 2014-02-14 ENCOUNTER — Ambulatory Visit (INDEPENDENT_AMBULATORY_CARE_PROVIDER_SITE_OTHER): Payer: Medicare Other | Admitting: *Deleted

## 2014-02-14 ENCOUNTER — Encounter: Payer: Self-pay | Admitting: Internal Medicine

## 2014-02-14 ENCOUNTER — Ambulatory Visit (INDEPENDENT_AMBULATORY_CARE_PROVIDER_SITE_OTHER): Payer: Medicare Other | Admitting: Internal Medicine

## 2014-02-14 ENCOUNTER — Ambulatory Visit (INDEPENDENT_AMBULATORY_CARE_PROVIDER_SITE_OTHER)
Admission: RE | Admit: 2014-02-14 | Discharge: 2014-02-14 | Disposition: A | Discharge status: Home / Self Care | Payer: Medicare Other | Source: Ambulatory Visit | Attending: Internal Medicine | Admitting: Internal Medicine

## 2014-02-14 VITALS — BP 122/64 | HR 86 | Temp 97.8°F | Resp 16 | Ht 70.0 in | Wt 208.0 lb

## 2014-02-14 DIAGNOSIS — Z23 Encounter for immunization: Secondary | ICD-10-CM

## 2014-02-14 DIAGNOSIS — J309 Allergic rhinitis, unspecified: Secondary | ICD-10-CM

## 2014-02-14 DIAGNOSIS — J189 Pneumonia, unspecified organism: Secondary | ICD-10-CM

## 2014-02-14 DIAGNOSIS — I472 Ventricular tachycardia: Secondary | ICD-10-CM

## 2014-02-14 DIAGNOSIS — Z5181 Encounter for therapeutic drug level monitoring: Secondary | ICD-10-CM

## 2014-02-14 DIAGNOSIS — Z7901 Long term (current) use of anticoagulants: Secondary | ICD-10-CM

## 2014-02-14 DIAGNOSIS — R0902 Hypoxemia: Secondary | ICD-10-CM

## 2014-02-14 DIAGNOSIS — I4729 Other ventricular tachycardia: Secondary | ICD-10-CM

## 2014-02-14 DIAGNOSIS — I4891 Unspecified atrial fibrillation: Secondary | ICD-10-CM

## 2014-02-14 DIAGNOSIS — I480 Paroxysmal atrial fibrillation: Secondary | ICD-10-CM

## 2014-02-14 DIAGNOSIS — N390 Urinary tract infection, site not specified: Secondary | ICD-10-CM

## 2014-02-14 LAB — POCT INR: INR: 1.8

## 2014-02-14 MED ORDER — AMOXICILLIN-POT CLAVULANATE 875-125 MG PO TABS: 1.0000 | ORAL_TABLET | Freq: Two times a day (BID) | ORAL | Status: DC

## 2014-02-14 MED ORDER — AZELASTINE-FLUTICASONE 137-50 MCG/ACT NA SUSP: 2.0000 | Freq: Two times a day (BID) | NASAL | Status: DC

## 2014-02-14 NOTE — Assessment & Plan Note (Addendum)
He appears to be improving Will recheck his CXR today Will cont ceftin to complete the course  Late note - the area of PNA appears to be worsening, will change his antibiotic to augmentin

## 2014-02-14 NOTE — Progress Notes (Signed)
Pre visit review using our clinic review tool, if applicable. No additional management support is needed unless otherwise documented below in the visit note. 

## 2014-02-14 NOTE — Addendum Note (Signed)
Addended by: Etta Grandchild on: 02/14/2014 11:44 AM   Modules accepted: Orders, Medications

## 2014-02-14 NOTE — Progress Notes (Signed)
Subjective:    Patient ID: [name], male    DOB: Aug 10, 1933, 80 y.o.   MRN: [id number]  Cough This is a recurrent problem. The current episode started in the past 7 days. The problem has been rapidly improving. The problem occurs every few hours. The cough is non-productive. Associated symptoms include nasal congestion, postnasal drip, rhinorrhea and shortness of breath. Pertinent negatives include no chest pain, chills, ear congestion, ear pain, fever, headaches, heartburn, hemoptysis, myalgias, rash, sore throat, sweats, weight loss or wheezing. The treatment provided moderate relief. His past medical history is significant for environmental allergies and pneumonia. There is no history of asthma, bronchiectasis, bronchitis, COPD or emphysema.      Review of Systems  Constitutional: Negative.  Negative for fever, chills, weight loss, diaphoresis, appetite change and fatigue.  HENT: Positive for congestion, postnasal drip and rhinorrhea. Negative for ear pain, nosebleeds, sinus pressure, sneezing, sore throat, tinnitus, trouble swallowing and voice change.   Eyes: Negative.  Negative for visual disturbance.  Respiratory: Positive for cough and shortness of breath. Negative for hemoptysis and wheezing.   Cardiovascular: Negative.  Negative for chest pain, palpitations and leg swelling.  Gastrointestinal: Negative.  Negative for heartburn, nausea, vomiting, abdominal pain, diarrhea, constipation and blood in stool.  Endocrine: Negative.   Genitourinary: Negative.   Musculoskeletal: Negative.  Negative for arthralgias, joint swelling, myalgias, neck pain and neck stiffness.  Skin: Negative.  Negative for pallor and rash.  Allergic/Immunologic: Positive for environmental allergies.  Neurological: Negative.  Negative for dizziness, syncope, speech difficulty, weakness, light-headedness, numbness and headaches.  Hematological: Negative.  Negative for adenopathy. Does not bruise/bleed  easily.  Psychiatric/Behavioral: Negative.        Objective:   Physical Exam  Vitals reviewed. Constitutional: He is oriented to person, place, and time. He appears well-developed and well-nourished.  Non-toxic appearance. He does not have a sickly appearance. He does not appear ill. No distress.  HENT:  Head: Normocephalic and atraumatic.  Right Ear: Hearing, tympanic membrane, external ear and ear canal normal.  Left Ear: Hearing, tympanic membrane, external ear and ear canal normal.  Nose: No mucosal edema, rhinorrhea, sinus tenderness, nasal deformity, septal deviation or nasal septal hematoma. No epistaxis.  No foreign bodies. Right sinus exhibits no maxillary sinus tenderness and no frontal sinus tenderness. Left sinus exhibits no maxillary sinus tenderness and no frontal sinus tenderness.  Mouth/Throat: Oropharynx is clear and moist and mucous membranes are normal. Mucous membranes are not pale, not dry and not cyanotic. No oral lesions. No trismus in the jaw. No uvula swelling. No oropharyngeal exudate, posterior oropharyngeal edema, posterior oropharyngeal erythema or tonsillar abscesses.  Eyes: Conjunctivae are normal. Right eye exhibits no discharge. Left eye exhibits no discharge. No scleral icterus.  Neck: Normal range of motion. Neck supple. No JVD present. No tracheal deviation present. No thyromegaly present.  Cardiovascular: Normal rate, regular rhythm, normal heart sounds and intact distal pulses.  Exam reveals no gallop and no friction rub.   No murmur heard. Pulmonary/Chest: Effort normal. No accessory muscle usage or stridor. Not tachypneic. No respiratory distress. He has no decreased breath sounds. He has no wheezes. He has no rhonchi. He has rales in the right lower field. He exhibits no tenderness.  Abdominal: Soft. Bowel sounds are normal. He exhibits no distension and no mass. There is no tenderness. There is no rebound and no guarding.  Musculoskeletal: Normal range  of motion. He exhibits no edema and no tenderness.  Lymphadenopathy:  He has no cervical adenopathy.  Neurological: He is oriented to person, place, and time.  Skin: Skin is warm and dry. No rash noted. He is not diaphoretic. No erythema. No pallor.  Psychiatric: He has a normal mood and affect. His behavior is normal. Judgment and thought content normal.     Lab Results  Component Value Date   WBC 7.1 02/07/2014   HGB 10.8* 02/07/2014   HCT 32.8* 02/07/2014   PLT 244 02/07/2014   GLUCOSE 102* 02/07/2014   CHOL 110 09/30/2013   TRIG 79.0 09/30/2013   HDL 49.50 09/30/2013   LDLCALC 45 09/30/2013   ALT 28 02/06/2014   AST 41* 02/06/2014   NA 137 02/07/2014   K 4.3 02/07/2014   CL 99 02/07/2014   CREATININE 1.30 02/07/2014   BUN 28* 02/07/2014   CO2 26 02/07/2014   TSH 1.14 09/30/2013   INR 2.73* 02/08/2014       Assessment & Plan:

## 2014-02-14 NOTE — Assessment & Plan Note (Signed)
He has DOE with activity, I think he needs O2 and have sent a stat order to [facility] for evaluation and treatment

## 2014-02-14 NOTE — Assessment & Plan Note (Signed)
Will start dymista for this 

## 2014-02-14 NOTE — Patient Instructions (Signed)

## 2014-02-26 ENCOUNTER — Telehealth: Payer: Self-pay | Admitting: Internal Medicine

## 2014-02-26 NOTE — Telephone Encounter (Signed)
Have received office notes but not order for oxygen.  They are needing qualifying oxygen saturation of 89% or less and insurance info.  [name] goes to lunch between 2 and 3

## 2014-02-27 NOTE — Telephone Encounter (Signed)
Order,note and insurance card refax today.

## 2014-03-09 DIAGNOSIS — J309 Allergic rhinitis, unspecified: Secondary | ICD-10-CM

## 2014-03-09 DIAGNOSIS — J189 Pneumonia, unspecified organism: Secondary | ICD-10-CM

## 2014-03-09 DIAGNOSIS — N390 Urinary tract infection, site not specified: Secondary | ICD-10-CM

## 2014-03-09 DIAGNOSIS — R0902 Hypoxemia: Secondary | ICD-10-CM

## 2014-03-13 ENCOUNTER — Ambulatory Visit (INDEPENDENT_AMBULATORY_CARE_PROVIDER_SITE_OTHER): Payer: Medicare Other | Admitting: *Deleted

## 2014-03-13 DIAGNOSIS — I480 Paroxysmal atrial fibrillation: Secondary | ICD-10-CM

## 2014-03-13 DIAGNOSIS — Z5181 Encounter for therapeutic drug level monitoring: Secondary | ICD-10-CM

## 2014-03-13 DIAGNOSIS — Z7901 Long term (current) use of anticoagulants: Secondary | ICD-10-CM

## 2014-03-13 DIAGNOSIS — I4891 Unspecified atrial fibrillation: Secondary | ICD-10-CM

## 2014-03-13 LAB — POCT INR: INR: 2.8

## 2014-03-23 ENCOUNTER — Other Ambulatory Visit: Payer: Self-pay

## 2014-03-23 MED ORDER — RAMIPRIL 5 MG PO CAPS: 5.0000 mg | ORAL_CAPSULE | Freq: Every day | ORAL | Status: DC

## 2014-03-23 MED ORDER — ATENOLOL 100 MG PO TABS: 50.0000 mg | ORAL_TABLET | Freq: Every day | ORAL | Status: DC

## 2014-03-25 ENCOUNTER — Other Ambulatory Visit: Payer: Self-pay

## 2014-03-25 MED ORDER — RAMIPRIL 5 MG PO CAPS: 5.0000 mg | ORAL_CAPSULE | Freq: Every day | ORAL | Status: DC

## 2014-03-28 ENCOUNTER — Other Ambulatory Visit: Payer: Self-pay | Admitting: Internal Medicine

## 2014-03-28 DIAGNOSIS — I5042 Chronic combined systolic (congestive) and diastolic (congestive) heart failure: Secondary | ICD-10-CM

## 2014-03-28 DIAGNOSIS — I255 Ischemic cardiomyopathy: Secondary | ICD-10-CM

## 2014-03-28 DIAGNOSIS — R55 Syncope and collapse: Secondary | ICD-10-CM

## 2014-03-28 DIAGNOSIS — I4729 Other ventricular tachycardia: Secondary | ICD-10-CM

## 2014-03-28 DIAGNOSIS — I472 Ventricular tachycardia: Secondary | ICD-10-CM

## 2014-03-28 DIAGNOSIS — Z4502 Encounter for adjustment and management of automatic implantable cardiac defibrillator: Secondary | ICD-10-CM

## 2014-03-28 MED ORDER — AMIODARONE HCL 200 MG PO TABS: 200.0000 mg | ORAL_TABLET | Freq: Every day | ORAL | Status: DC

## 2014-03-31 ENCOUNTER — Encounter: Payer: Self-pay | Admitting: Internal Medicine

## 2014-04-04 ENCOUNTER — Ambulatory Visit (INDEPENDENT_AMBULATORY_CARE_PROVIDER_SITE_OTHER): Payer: Medicare Other | Admitting: Internal Medicine

## 2014-04-04 ENCOUNTER — Encounter: Payer: Self-pay | Admitting: Internal Medicine

## 2014-04-04 ENCOUNTER — Ambulatory Visit (INDEPENDENT_AMBULATORY_CARE_PROVIDER_SITE_OTHER)
Admission: RE | Admit: 2014-04-04 | Discharge: 2014-04-04 | Disposition: A | Discharge status: Home / Self Care | Payer: Medicare Other | Source: Ambulatory Visit | Attending: Internal Medicine | Admitting: Internal Medicine

## 2014-04-04 VITALS — BP 118/68 | HR 91 | Temp 98.3°F | Resp 16 | Ht 70.0 in | Wt 207.0 lb

## 2014-04-04 DIAGNOSIS — J189 Pneumonia, unspecified organism: Secondary | ICD-10-CM

## 2014-04-04 DIAGNOSIS — J984 Other disorders of lung: Secondary | ICD-10-CM

## 2014-04-04 DIAGNOSIS — I10 Essential (primary) hypertension: Secondary | ICD-10-CM

## 2014-04-04 DIAGNOSIS — R918 Other nonspecific abnormal finding of lung field: Secondary | ICD-10-CM

## 2014-04-04 DIAGNOSIS — R0902 Hypoxemia: Secondary | ICD-10-CM

## 2014-04-04 NOTE — Patient Instructions (Signed)

## 2014-04-04 NOTE — Progress Notes (Signed)
Pre visit review using our clinic review tool, if applicable. No additional management support is needed unless otherwise documented below in the visit note. 

## 2014-04-05 ENCOUNTER — Encounter: Payer: Self-pay | Admitting: Internal Medicine

## 2014-04-05 DIAGNOSIS — R918 Other nonspecific abnormal finding of lung field: Secondary | ICD-10-CM | POA: Insufficient documentation

## 2014-04-05 NOTE — Assessment & Plan Note (Signed)
His quit smoking 2 years ago so he is high risk for bronchogenic ca Will get a CT done and consider referral to pulmonary medicine

## 2014-04-05 NOTE — Assessment & Plan Note (Signed)
Based upon his symptoms and the xray appearance his PNA has resolved Will get a CT scan done to get more info about the mass

## 2014-04-05 NOTE — Assessment & Plan Note (Signed)
Improvement noted 

## 2014-04-05 NOTE — Progress Notes (Signed)
Subjective:    Patient ID: [name], male    DOB: May 19, 1934, 80 y.o.   MRN: [id number]  Cough This is a recurrent problem. The current episode started more than 1 month ago. The problem has been gradually improving. The problem occurs every few hours. The cough is non-productive. Associated symptoms include shortness of breath. Pertinent negatives include no chest pain, chills, ear congestion, ear pain, fever, headaches, heartburn, hemoptysis, myalgias, nasal congestion, postnasal drip, rash, rhinorrhea, sore throat, sweats, weight loss or wheezing. Nothing aggravates the symptoms. He has tried prescription cough suppressant for the symptoms. The treatment provided significant relief. His past medical history is significant for COPD and pneumonia. There is no history of asthma, bronchiectasis, bronchitis, emphysema or environmental allergies.      Review of Systems  Constitutional: Negative.  Negative for fever, chills, weight loss, diaphoresis, appetite change and fatigue.  HENT: Negative.  Negative for ear pain, postnasal drip, rhinorrhea and sore throat.   Eyes: Negative.   Respiratory: Positive for cough and shortness of breath. Negative for hemoptysis and wheezing.   Cardiovascular: Negative.  Negative for chest pain, palpitations and leg swelling.  Gastrointestinal: Negative.  Negative for heartburn, nausea, vomiting, abdominal pain, diarrhea, constipation and blood in stool.  Endocrine: Negative.   Genitourinary: Negative.   Musculoskeletal: Negative.  Negative for myalgias.  Skin: Negative.  Negative for rash.  Allergic/Immunologic: Negative.  Negative for environmental allergies.  Neurological: Negative.  Negative for headaches.  Hematological: Negative.  Negative for adenopathy. Does not bruise/bleed easily.  Psychiatric/Behavioral: Negative.        Objective:   Physical Exam  Constitutional: He is oriented to person, place, and time. He appears well-developed and  well-nourished. No distress.  HENT:  Head: Normocephalic and atraumatic.  Mouth/Throat: Oropharynx is clear and moist.  Eyes: Conjunctivae are normal. Right eye exhibits no discharge. Left eye exhibits no discharge. No scleral icterus.  Neck: Normal range of motion. Neck supple. No JVD present. No tracheal deviation present. No thyromegaly present.  Cardiovascular: Normal rate, regular rhythm, normal heart sounds and intact distal pulses.  Exam reveals no gallop and no friction rub.   No murmur heard. Pulmonary/Chest: Effort normal and breath sounds normal. No stridor. No respiratory distress. He has no wheezes. He has no rales. He exhibits no tenderness.  Abdominal: Soft. Bowel sounds are normal. He exhibits no distension and no mass. There is no tenderness. There is no rebound and no guarding.  Musculoskeletal: Normal range of motion. He exhibits no edema or tenderness.  Lymphadenopathy:    He has no cervical adenopathy.  Neurological: He is oriented to person, place, and time.  Skin: Skin is warm and dry. No rash noted. He is not diaphoretic. No erythema. No pallor.  Psychiatric: He has a normal mood and affect. His behavior is normal. Judgment and thought content normal.  Vitals reviewed.    Lab Results  Component Value Date   WBC 7.1 02/07/2014   HGB 10.8* 02/07/2014   HCT 32.8* 02/07/2014   PLT 244 02/07/2014   GLUCOSE 102* 02/07/2014   CHOL 110 09/30/2013   TRIG 79.0 09/30/2013   HDL 49.50 09/30/2013   LDLCALC 45 09/30/2013   ALT 28 02/06/2014   AST 41* 02/06/2014   NA 137 02/07/2014   K 4.3 02/07/2014   CL 99 02/07/2014   CREATININE 1.30 02/07/2014   BUN 28* 02/07/2014   CO2 26 02/07/2014   TSH 1.14 09/30/2013   INR 2.8 03/13/2014  Assessment & Plan:

## 2014-04-07 NOTE — Addendum Note (Signed)
Addended by: Etta Grandchild on: 04/07/2014 03:24 PM   Modules accepted: Orders

## 2014-04-08 ENCOUNTER — Other Ambulatory Visit: Payer: Medicare Other

## 2014-04-08 ENCOUNTER — Other Ambulatory Visit: Payer: Self-pay | Admitting: Internal Medicine

## 2014-04-08 ENCOUNTER — Ambulatory Visit (INDEPENDENT_AMBULATORY_CARE_PROVIDER_SITE_OTHER)
Admission: RE | Admit: 2014-04-08 | Discharge: 2014-04-08 | Disposition: A | Discharge status: Home / Self Care | Payer: Medicare Other | Source: Ambulatory Visit | Attending: Internal Medicine | Admitting: Internal Medicine

## 2014-04-08 ENCOUNTER — Ambulatory Visit (INDEPENDENT_AMBULATORY_CARE_PROVIDER_SITE_OTHER): Payer: Medicare Other

## 2014-04-08 DIAGNOSIS — I10 Essential (primary) hypertension: Secondary | ICD-10-CM

## 2014-04-08 DIAGNOSIS — J189 Pneumonia, unspecified organism: Secondary | ICD-10-CM

## 2014-04-08 DIAGNOSIS — R918 Other nonspecific abnormal finding of lung field: Secondary | ICD-10-CM

## 2014-04-08 DIAGNOSIS — Z5181 Encounter for therapeutic drug level monitoring: Secondary | ICD-10-CM

## 2014-04-08 DIAGNOSIS — I480 Paroxysmal atrial fibrillation: Secondary | ICD-10-CM

## 2014-04-08 DIAGNOSIS — I4891 Unspecified atrial fibrillation: Secondary | ICD-10-CM

## 2014-04-08 DIAGNOSIS — Z7901 Long term (current) use of anticoagulants: Secondary | ICD-10-CM

## 2014-04-08 DIAGNOSIS — J984 Other disorders of lung: Secondary | ICD-10-CM

## 2014-04-08 LAB — BASIC METABOLIC PANEL
BUN: 17 mg/dL (ref 6–23)
CHLORIDE: 106 meq/L (ref 96–112)
CO2: 28 meq/L (ref 19–32)
CREATININE: 1.1 mg/dL (ref 0.50–1.35)
Calcium: 9.6 mg/dL (ref 8.4–10.5)
GLUCOSE: 126 mg/dL — AB (ref 70–99)
Potassium: 4.7 mEq/L (ref 3.5–5.3)
Sodium: 143 mEq/L (ref 135–145)

## 2014-04-08 LAB — POCT INR: INR: 1.3

## 2014-04-08 MED ORDER — IOHEXOL 300 MG/ML  SOLN
80.0000 mL | Freq: Once | INTRAMUSCULAR | Status: AC | PRN
  Administered 2014-04-08: 80 mL via INTRAVENOUS

## 2014-04-09 ENCOUNTER — Other Ambulatory Visit: Payer: Self-pay | Admitting: Internal Medicine

## 2014-04-09 ENCOUNTER — Encounter: Payer: Self-pay | Admitting: Internal Medicine

## 2014-04-09 DIAGNOSIS — R918 Other nonspecific abnormal finding of lung field: Secondary | ICD-10-CM

## 2014-04-10 ENCOUNTER — Ambulatory Visit (INDEPENDENT_AMBULATORY_CARE_PROVIDER_SITE_OTHER): Payer: Medicare Other | Admitting: *Deleted

## 2014-04-10 ENCOUNTER — Encounter: Payer: Self-pay | Admitting: Internal Medicine

## 2014-04-10 DIAGNOSIS — I5042 Chronic combined systolic (congestive) and diastolic (congestive) heart failure: Secondary | ICD-10-CM

## 2014-04-10 DIAGNOSIS — I255 Ischemic cardiomyopathy: Secondary | ICD-10-CM

## 2014-04-10 LAB — MDC_IDC_ENUM_SESS_TYPE_REMOTE
Battery Remaining Longevity: 86 mo
Battery Remaining Percentage: 93 %
Brady Statistic AP VP Percent: 4.5 %
Brady Statistic AP VS Percent: 95 %
Brady Statistic AS VS Percent: 1 %
Brady Statistic RA Percent Paced: 99 %
Date Time Interrogation Session: 20151119142015
HIGH POWER IMPEDANCE MEASURED VALUE: 37 Ohm
HIGH POWER IMPEDANCE MEASURED VALUE: 37 Ohm
Lead Channel Impedance Value: 410 Ohm
Lead Channel Pacing Threshold Amplitude: 0.75 V
Lead Channel Pacing Threshold Amplitude: 1.25 V
Lead Channel Pacing Threshold Pulse Width: 0.5 ms
Lead Channel Sensing Intrinsic Amplitude: 1.4 mV
Lead Channel Setting Pacing Amplitude: 2.5 V
Lead Channel Setting Pacing Pulse Width: 0.5 ms
Lead Channel Setting Sensing Sensitivity: 0.5 mV
MDC IDC MSMT BATTERY VOLTAGE: 3.16 V
MDC IDC MSMT LEADCHNL RV IMPEDANCE VALUE: 350 Ohm
MDC IDC MSMT LEADCHNL RV PACING THRESHOLD PULSEWIDTH: 0.5 ms
MDC IDC MSMT LEADCHNL RV SENSING INTR AMPL: 11.9 mV
MDC IDC PG SERIAL: 7194807
MDC IDC SET LEADCHNL RA PACING AMPLITUDE: 2 V
MDC IDC SET ZONE DETECTION INTERVAL: 475 ms
MDC IDC STAT BRADY AS VP PERCENT: 1 %
MDC IDC STAT BRADY RV PERCENT PACED: 4.5 %
Zone Setting Detection Interval: 280 ms

## 2014-04-10 NOTE — Progress Notes (Signed)
Remote ICD transmission.   

## 2014-04-16 ENCOUNTER — Ambulatory Visit (INDEPENDENT_AMBULATORY_CARE_PROVIDER_SITE_OTHER): Payer: Medicare Other | Admitting: Pharmacist

## 2014-04-16 DIAGNOSIS — I4891 Unspecified atrial fibrillation: Secondary | ICD-10-CM

## 2014-04-16 DIAGNOSIS — Z7901 Long term (current) use of anticoagulants: Secondary | ICD-10-CM

## 2014-04-16 DIAGNOSIS — I480 Paroxysmal atrial fibrillation: Secondary | ICD-10-CM

## 2014-04-16 DIAGNOSIS — Z5181 Encounter for therapeutic drug level monitoring: Secondary | ICD-10-CM

## 2014-04-16 LAB — POCT INR: INR: 1.9

## 2014-04-23 ENCOUNTER — Encounter: Payer: Self-pay | Admitting: Internal Medicine

## 2014-04-23 ENCOUNTER — Other Ambulatory Visit (INDEPENDENT_AMBULATORY_CARE_PROVIDER_SITE_OTHER): Payer: Medicare Other

## 2014-04-23 ENCOUNTER — Ambulatory Visit (INDEPENDENT_AMBULATORY_CARE_PROVIDER_SITE_OTHER): Payer: Medicare Other | Admitting: Internal Medicine

## 2014-04-23 VITALS — BP 106/60 | HR 60 | Ht 70.0 in | Wt 212.6 lb

## 2014-04-23 DIAGNOSIS — R0600 Dyspnea, unspecified: Secondary | ICD-10-CM

## 2014-04-23 DIAGNOSIS — R918 Other nonspecific abnormal finding of lung field: Secondary | ICD-10-CM

## 2014-04-23 DIAGNOSIS — J984 Other disorders of lung: Secondary | ICD-10-CM

## 2014-04-23 LAB — CBC WITH DIFFERENTIAL/PLATELET
Basophils Absolute: 0 10*3/uL (ref 0.0–0.1)
Basophils Relative: 0.4 % (ref 0.0–3.0)
Eosinophils Absolute: 0.3 10*3/uL (ref 0.0–0.7)
Eosinophils Relative: 3.4 % (ref 0.0–5.0)
HCT: 37.2 % — ABNORMAL LOW (ref 39.0–52.0)
HEMOGLOBIN: 12.3 g/dL — AB (ref 13.0–17.0)
LYMPHS ABS: 0.8 10*3/uL (ref 0.7–4.0)
Lymphocytes Relative: 9.3 % — ABNORMAL LOW (ref 12.0–46.0)
MCHC: 33.1 g/dL (ref 30.0–36.0)
MCV: 89.1 fl (ref 78.0–100.0)
MONOS PCT: 5.4 % (ref 3.0–12.0)
Monocytes Absolute: 0.5 10*3/uL (ref 0.1–1.0)
NEUTROS ABS: 7.3 10*3/uL (ref 1.4–7.7)
Neutrophils Relative %: 81.5 % — ABNORMAL HIGH (ref 43.0–77.0)
Platelets: 227 10*3/uL (ref 150.0–400.0)
RBC: 4.17 Mil/uL — ABNORMAL LOW (ref 4.22–5.81)
RDW: 15.3 % (ref 11.5–15.5)
WBC: 9 10*3/uL (ref 4.0–10.5)

## 2014-04-23 LAB — TSH: TSH: 2.18 u[IU]/mL (ref 0.35–4.50)

## 2014-04-23 LAB — BRAIN NATRIURETIC PEPTIDE: Pro B Natriuretic peptide (BNP): 155 pg/mL — ABNORMAL HIGH (ref 0.0–100.0)

## 2014-04-23 LAB — SEDIMENTATION RATE: Sed Rate: 58 mm/hr — ABNORMAL HIGH (ref 0–22)

## 2014-04-23 NOTE — Patient Instructions (Signed)
Please remember to go to the lab  department downstairs for your tests - we will call you with the results when they are available.  For now just use the 02 at bedtime and as needed during the day (keep your 02 above 90% that's the goal)  Please schedule a follow up office visit in 6 weeks, call sooner if needed pfts and cxr

## 2014-04-23 NOTE — Progress Notes (Signed)
Subjective:    Patient ID: [name], male    DOB: 1933/08/16, MRN: [id number]  HPI  77 yowm quit April 2013 s respiratory sequelae felt bad for about 3-4 weeks before being seen 02/06/14 > admitted with dx cap / d/c on 02    Admit date: 02/06/2014 Discharge date: 02/08/2014  Recommendations for Outpatient Follow-up:  1. Please take Ceftin for 10 days on discharge which will cover for pneumonia and urinary tract infection. 2. Continue coumadin per prior home regimen. Please recheck INR in next 3 days from discharge to make sure it is within the range 2-3.  Discharge Diagnoses:  Principal Problem:  Acute respiratory failure with hypoxia Active Problems:  Atrial fibrillation  Fever  CAP (community acquired pneumonia)  UTI (lower urinary tract infection)  Protein-calorie malnutrition, severe    Discharge Condition: stable   Diet recommendation: as tolerated   History of present illness:  80 y.o. male with past medical history of atrial fibrillation on anticoagulation with coumadin, also on digoxin, HTN and ventricular tachycardia status post pacemaker and ICD who presented to [facility] ED 02/06/2014 with worsening shortness of breath for past 24 hours prior to this admission. Pt also noted to be hypoxic with oxygen saturation in 70's. CXR on admission concerning fot right side pneumonia. Pt started on azithromycin and rocephin. Pt laso had urinalysis consistent with UTI.   Assessment/Plan:   Principal Problem:  Acute respiratory failure with hypoxia / Community acquired pneumonia   Secondary to community acquired pneumonia   Respiratory status is stable. Will have pt ambulate prior to discharge and perhaps he does not need oxygen at home.   Blood cultures negative to date, strep pneumo and legionella negative   Was on azithromycin and rocephin for community acquired pneumonia. Switch to PO Ceftin for 10 days on discharge.   Pt received nebulizer treatment  with Duoneb TID and albuterol and Atrovent every 4 hours PRN Active Problems:  Atrial fibrillation   Continue digoxin and amiodarone for rate control.   Coumadin per prior home regimen. INR to be checked in 3 days after discharge. Hypertension   Continue atenolol and ramipril UTI (lower urinary tract infection) secondary to E.Coli  Continue Ceftin on discharge for 10 days. Dyslipidemia   Continue statin therapy Protein calorie malnutrition, mild   Pt ate whole breakfast. His weight is stable at 92 kg. I do not agree with severe protein calorie malnutrition. DVT Prophylaxis   Coumadin per prior home dosing.    04/23/2014 1st [facility] office visit/ [name]   Chief Complaint  Patient presents with  . Pulmonary Consult    Referred by Dr. Sanda Linger for eval of MPN. Pt c/o SOB since mid Oct 2015 when he was admitted to hosp with PNA. He gets SOB with climbing stairs.    not sure he needs 02 / not using during the day at all  Feet slow him down more than breathing   No obvious other patterns in day to day or daytime variabilty or assoc chronic cough or cp or chest tightness, subjective wheeze overt sinus or hb symptoms. No unusual exp hx or h/o childhood pna/ asthma or knowledge of premature birth.  Sleeping ok without nocturnal  or early am exacerbation  of respiratory  c/o's or need for noct saba. Also denies any obvious fluctuation of symptoms with weather or environmental changes or other aggravating or alleviating factors except as outlined above   Current Medications, Allergies, Complete Past Medical History, Past  Surgical History, Family History, and Social History were reviewed in [facility] electronic medical record.           Review of Systems  Constitutional: Negative for fever, chills, activity change, appetite change and unexpected weight change.  HENT: Negative for congestion, dental problem, postnasal drip, rhinorrhea, sneezing, sore throat, trouble  swallowing and voice change.   Eyes: Negative for visual disturbance.  Respiratory: Positive for shortness of breath. Negative for cough and choking.   Cardiovascular: Negative for chest pain and leg swelling.  Gastrointestinal: Negative for nausea, vomiting and abdominal pain.  Genitourinary: Negative for difficulty urinating.  Musculoskeletal: Negative for arthralgias.  Skin: Negative for rash.  Psychiatric/Behavioral: Negative for behavioral problems and confusion.       Objective:   Physical Exam  Pleasant amb wm nad  Wt Readings from Last 3 Encounters:  04/23/14 212 lb 9.6 oz (96.435 kg)  04/04/14 207 lb (93.895 kg)  02/14/14 208 lb (94.348 kg)    Vital signs reviewed  HEENT: nl dentition, turbinates, and orophanx. Nl external ear canals without cough reflex   NECK :  without JVD/Nodes/TM/ nl carotid upstrokes bilaterally   LUNGS: no acc muscle use, clear to A and P bilaterally without cough on insp or exp maneuvers   CV:  RRR  no s3 or murmur or increase in P2, no edema   ABD:  soft and nontender with nl excursion in the supine position. No bruits or organomegaly, bowel sounds nl  MS:  warm without deformities, calf tenderness, cyanosis or clubbing  SKIN: warm and dry without lesions    NEURO:  alert, approp, no deficits   Ct chest with contrast 04/08/14 1. No evidence of right lower lobe lung mass as questioned recently. 2. Peribronchovascular opacities throughout both lungs, greatest in the right lower lobe, associated with micronodules in a perilymphatic distribution. This does not have the typical appearance of community acquired pneumonia. Sarcoidosis, amyloidosis, and inhalational      Chemistry      Component Value Date/Time   NA 143 04/08/2014 1307   K 4.7 04/08/2014 1307   CL 106 04/08/2014 1307   CO2 28 04/08/2014 1307   BUN 17 04/08/2014 1307   CREATININE 1.10 04/08/2014 1307   CREATININE 1.30 02/07/2014 0500      Component Value  Date/Time   CALCIUM 9.6 04/08/2014 1307   ALKPHOS 90 02/06/2014 1310   AST 41* 02/06/2014 1310   ALT 28 02/06/2014 1310   BILITOT 0.6 02/06/2014 1310      Recent Labs Lab 04/23/14 1146  HGB 12.3*  HCT 37.2*  WBC 9.0  PLT 227.0     Lab Results  Component Value Date   TSH 2.18 04/23/2014     Lab Results  Component Value Date   PROBNP 155.0* 04/23/2014     Lab Results  Component Value Date   ESRSEDRATE 58* 04/23/2014          Assessment & Plan:

## 2014-04-24 NOTE — Progress Notes (Signed)
Quick Note:  Spoke with pt and notified of results per Dr. Wert. Pt verbalized understanding and denied any questions.  ______ 

## 2014-04-26 NOTE — Assessment & Plan Note (Signed)
No evidence of a lung mass. He does have microscopic nodules and Although there are clearly abnormalities on CT scan, they should probably be considered "microscopic" since not obvious on plain cxr .     In the setting of obvious "macroscopic" health issues,  I am very reluctatnt to embark on an invasive w/u at this point but will arrange consevative  follow up and in the meantime see what we can do to address the patient's subjective concerns and f/u on plain cxr with pfts on return  (unless persistent unexplained symptoms in setting of multiple nodule there's really no need to do serial cts)   See instructions for specific recommendations which were reviewed directly with the patient who was given a copy with highlighter outlining the key components.

## 2014-04-26 NOTE — Assessment & Plan Note (Signed)
-   04/23/2014  Walked RA x 3 laps @ 185 ft each stopped due to end of study, no sob or desat at a nl pace     Resolved to his satisfaction

## 2014-05-01 ENCOUNTER — Ambulatory Visit (INDEPENDENT_AMBULATORY_CARE_PROVIDER_SITE_OTHER): Payer: Medicare Other

## 2014-05-01 ENCOUNTER — Encounter (HOSPITAL_COMMUNITY): Payer: Self-pay | Admitting: Internal Medicine

## 2014-05-01 DIAGNOSIS — Z5181 Encounter for therapeutic drug level monitoring: Secondary | ICD-10-CM

## 2014-05-01 DIAGNOSIS — I4891 Unspecified atrial fibrillation: Secondary | ICD-10-CM

## 2014-05-01 LAB — POCT INR: INR: 2.3

## 2014-05-14 ENCOUNTER — Encounter: Payer: Self-pay | Admitting: Cardiology

## 2014-05-22 ENCOUNTER — Ambulatory Visit (INDEPENDENT_AMBULATORY_CARE_PROVIDER_SITE_OTHER): Payer: Medicare Other | Admitting: Pharmacist Clinician (PhC)/ Clinical Pharmacy Specialist

## 2014-05-22 DIAGNOSIS — Z7901 Long term (current) use of anticoagulants: Secondary | ICD-10-CM

## 2014-05-22 DIAGNOSIS — I4891 Unspecified atrial fibrillation: Secondary | ICD-10-CM

## 2014-05-22 DIAGNOSIS — Z5181 Encounter for therapeutic drug level monitoring: Secondary | ICD-10-CM

## 2014-05-22 LAB — POCT INR: INR: 3.3

## 2014-05-26 ENCOUNTER — Encounter: Payer: Self-pay | Admitting: Internal Medicine

## 2014-06-03 ENCOUNTER — Telehealth: Payer: Self-pay | Admitting: *Deleted

## 2014-06-03 ENCOUNTER — Ambulatory Visit (INDEPENDENT_AMBULATORY_CARE_PROVIDER_SITE_OTHER): Payer: Medicare Other | Admitting: Internal Medicine

## 2014-06-03 ENCOUNTER — Other Ambulatory Visit (INDEPENDENT_AMBULATORY_CARE_PROVIDER_SITE_OTHER): Payer: Medicare Other

## 2014-06-03 ENCOUNTER — Ambulatory Visit (INDEPENDENT_AMBULATORY_CARE_PROVIDER_SITE_OTHER)
Admission: RE | Admit: 2014-06-03 | Discharge: 2014-06-03 | Disposition: A | Discharge status: Home / Self Care | Payer: Medicare Other | Source: Ambulatory Visit | Attending: Internal Medicine | Admitting: Internal Medicine

## 2014-06-03 ENCOUNTER — Encounter: Payer: Self-pay | Admitting: Internal Medicine

## 2014-06-03 VITALS — BP 90/56 | HR 60 | Ht 70.0 in | Wt 211.0 lb

## 2014-06-03 DIAGNOSIS — R0902 Hypoxemia: Secondary | ICD-10-CM

## 2014-06-03 DIAGNOSIS — R0600 Dyspnea, unspecified: Secondary | ICD-10-CM

## 2014-06-03 DIAGNOSIS — I10 Essential (primary) hypertension: Secondary | ICD-10-CM

## 2014-06-03 LAB — PULMONARY FUNCTION TEST
DL/VA % PRED: 72 %
DL/VA: 3.31 ml/min/mmHg/L
DLCO UNC % PRED: 54 %
DLCO unc: 17.47 ml/min/mmHg
FEF 25-75 PRE: 1.5 L/s
FEF 25-75 Post: 2.17 L/sec
FEF2575-%CHANGE-POST: 44 %
FEF2575-%PRED-POST: 105 %
FEF2575-%PRED-PRE: 72 %
FEV1-%CHANGE-POST: 9 %
FEV1-%PRED-POST: 91 %
FEV1-%Pred-Pre: 83 %
FEV1-PRE: 2.45 L
FEV1-Post: 2.68 L
FEV1FVC-%Change-Post: 0 %
FEV1FVC-%PRED-PRE: 97 %
FEV6-%Change-Post: 9 %
FEV6-%PRED-PRE: 90 %
FEV6-%Pred-Post: 98 %
FEV6-PRE: 3.44 L
FEV6-Post: 3.77 L
FEV6FVC-%Change-Post: -1 %
FEV6FVC-%Pred-Post: 104 %
FEV6FVC-%Pred-Pre: 106 %
FVC-%Change-Post: 10 %
FVC-%PRED-PRE: 85 %
FVC-%Pred-Post: 94 %
FVC-PRE: 3.51 L
FVC-Post: 3.87 L
Post FEV1/FVC ratio: 69 %
Post FEV6/FVC ratio: 97 %
Pre FEV1/FVC ratio: 70 %
Pre FEV6/FVC Ratio: 99 %
RV % pred: 91 %
RV: 2.42 L
TLC % PRED: 88 %
TLC: 6.23 L

## 2014-06-03 LAB — SEDIMENTATION RATE: Sed Rate: 43 mm/hr — ABNORMAL HIGH (ref 0–22)

## 2014-06-03 NOTE — Telephone Encounter (Signed)
Called patient in reference to holding procedure prior to on 06/24/14, patient states he got approval to hold coumadin. I have a telephone message by Dr. Graciela Husbands not to hold the coumadin if elective per Dr. Dagoberto Ligas. Instructed patient, that I will follow up with Dr. Graciela Husbands in reference to what he would like the patient to do and will call him back.

## 2014-06-03 NOTE — Telephone Encounter (Signed)
-----   Message from Duke Salvia, MD sent at 05/25/2014  2:22 PM EST ----- If elective would not hold  ----- Message -----    From: Olin Hauser, RN    Sent: 05/22/2014   2:08 PM      To: Duke Salvia, MD  Patient is having a surgical procedure-Pterygium excision on eye by Dr. Dagoberto Ligas on 06/24/14.  Dr. Dagoberto Ligas advises that holding coumadin is optional but they prefer to hold Coumadin 3-5 days if approved by you.  Please advise if you would like the patient to hold the coumadin and if so how many days. Thanks.

## 2014-06-03 NOTE — Patient Instructions (Addendum)
Your throat clearing is probably due to altace but at this point is a nuisance, not a threat to your health  If you start losing ground with walking due to shortness of breath, I need you to return right away to pulmonary clinic  Please see patient coordinator before you leave today  to schedule overnight 02 study on room air   Please remember to go to the lab  department downstairs for your tests - we will call you with the results when they are available.    Pulmonary follow up is as needed

## 2014-06-03 NOTE — Progress Notes (Signed)
PFT done today. 

## 2014-06-03 NOTE — Progress Notes (Signed)
Subjective:    Patient ID: [name], male    DOB: [birth year], MRN: [id number]    Brief patient profile:  66 yowm quit April 2013 s respiratory sequelae felt bad for about 3-4 weeks before being seen 02/06/14 > admitted with dx cap / d/c on 02    Admit date: 02/06/2014 Discharge date: 02/08/2014  Recommendations for Outpatient Follow-up:  1. Please take Ceftin for 10 days on discharge which will cover for pneumonia and urinary tract infection. 2. Continue coumadin per prior home regimen. Please recheck INR in next 3 days from discharge to make sure it is within the range 2-3.  Discharge Diagnoses:  Principal Problem:  Acute respiratory failure with hypoxia Active Problems:  Atrial fibrillation  Fever  CAP (community acquired pneumonia)  UTI (lower urinary tract infection)  Protein-calorie malnutrition, severe     History of present illness:  81 y.o. male with past medical history of atrial fibrillation on anticoagulation with coumadin, also on digoxin, HTN and ventricular tachycardia status post pacemaker and ICD who presented to [facility] ED 02/06/2014 with worsening shortness of breath for past 24 hours prior to this admission. Pt also noted to be hypoxic with oxygen saturation in 70's. CXR on admission concerning fot right side pneumonia. Pt started on azithromycin and rocephin. Pt laso had urinalysis consistent with UTI.   Assessment/Plan:   Principal Problem:  Acute respiratory failure with hypoxia / Community acquired pneumonia   Secondary to community acquired pneumonia   Respiratory status is stable. Will have pt ambulate prior to discharge and perhaps he does not need oxygen at home.   Blood cultures negative to date, strep pneumo and legionella negative   Was on azithromycin and rocephin for community acquired pneumonia. Switch to PO Ceftin for 10 days on discharge.   Pt received nebulizer treatment with Duoneb TID and albuterol and Atrovent every 4  hours PRN Active Problems:  Atrial fibrillation   Continue digoxin and amiodarone for rate control.   Coumadin per prior home regimen. INR to be checked in 3 days after discharge. Hypertension   Continue atenolol and ramipril UTI (lower urinary tract infection) secondary to E.Coli  Continue Ceftin on discharge for 10 days. Dyslipidemia   Continue statin therapy Protein calorie malnutrition, mild   Pt ate whole breakfast. His weight is stable at 92 kg. I do not agree with severe protein calorie malnutrition. DVT Prophylaxis   Coumadin per prior home dosing.   History of Present Illness  04/23/2014 1st [facility] office visit/ Gillis Boardley   Chief Complaint  Patient presents with  . Pulmonary Consult    Referred by Dr. Sanda Linger for eval of MPN. Pt c/o SOB since mid Oct 2015 when he was admitted to hosp with PNA. He gets SOB with climbing stairs.    not sure he needs 02 / not using during the day at all  Feet slow him down more than breathing  rec For now just use the 02 at bedtime and as needed during the day (keep your 02 above 90% that's the goal) Please schedule a follow up office visit in 6 weeks, call sooner if needed pfts and cxr    06/03/2014 f/u ov/Fawn Desrocher re: chronic resp failure sp apparent cap  / on amiodarone and altace Chief Complaint  Patient presents with  . Follow-up    PFT and CXR was done today.  Pt states that his breathing is much improved, back to his normal baseline. No new co's today.  does not think he needs daytime 02 at this point, wants to be checked at night also  Can do [employer] but needs scooter for [employer] due to sob  Feels urge to clear his throat chronically x years per wife daytime, worse with voice use   No obvious day to day or daytime variabilty or assoc excess or purulent mucus or cp or chest tightness, subjective wheeze overt sinus or hb symptoms. No unusual exp hx or h/o childhood pna/ asthma or knowledge of premature  birth.  Sleeping ok without nocturnal  or early am exacerbation  of respiratory  c/o's or need for noct saba. Also denies any obvious fluctuation of symptoms with weather or environmental changes or other aggravating or alleviating factors except as outlined above   Current Medications, Allergies, Complete Past Medical History, Past Surgical History, Family History, and Social History were reviewed in [employer] record.  ROS  The following are not active complaints unless bolded sore throat, dysphagia, dental problems, itching, sneezing,  nasal congestion or excess/ purulent secretions, ear ache,   fever, chills, sweats, unintended wt loss, pleuritic or exertional cp, hemoptysis,  orthopnea pnd or leg swelling, presyncope, palpitations, heartburn, abdominal pain, anorexia, nausea, vomiting, diarrhea  or change in bowel or urinary habits, change in stools or urine, dysuria,hematuria,  rash, arthralgias, visual complaints, headache, numbness weakness or ataxia or problems with walking or coordination,  change in mood/affect or memory.                             Objective:   Physical Exam  Pleasant amb wm nad  06/03/2014       211  Wt Readings from Last 3 Encounters:  04/23/14 212 lb 9.6 oz (96.435 kg)  04/04/14 207 lb (93.895 kg)  02/14/14 208 lb (94.348 kg)    Vital signs reviewed  HEENT: nl dentition, turbinates, and orophanx. Nl external ear canals without cough reflex   NECK :  without JVD/Nodes/TM/ nl carotid upstrokes bilaterally   LUNGS: no acc muscle use, clear to A and P bilaterally without cough on insp or exp maneuvers   CV:  RRR  no s3 or murmur or increase in P2, no edema   ABD:  soft and nontender with nl excursion in the supine position. No bruits or organomegaly, bowel sounds nl  MS:  warm without deformities, calf tenderness, cyanosis or clubbing  SKIN: warm and dry without lesions    NEURO:  alert, approp, no deficits   Ct  chest with contrast 04/08/14 images reviewed  1. No evidence of right lower lobe lung mass as questioned recently. 2. Peribronchovascular opacities throughout both lungs, greatest in the right lower lobe, associated with micronodules in a perilymphatic distribution. This does not have the typical appearance of community acquired pneumonia. Sarcoidosis, amyloidosis, and inhalational       Labs ordered today / images reviewed include:      Lab Results  Component Value Date   ESRSEDRATE 43* 06/03/2014   ESRSEDRATE 58* 04/23/2014   ESRSEDRATE 29* 12/26/2012   CXR PA and Lateral:   06/03/2014 :   1. Minimal residual right base infiltrate.  2. Prior CABG. Stable cardiomegaly. Cardiac pacer stable. No CHF.        Assessment & Plan:

## 2014-06-04 ENCOUNTER — Telehealth: Payer: Self-pay | Admitting: Internal Medicine

## 2014-06-04 NOTE — Telephone Encounter (Signed)
Result Note     Call pt: Reviewed cxr and no acute change so no change in recommendations made at ov   Result Note     Call patient : Study is unremarkable, no change in recs - the improving esr is typical of a normal healing process, not an active disease  ---   I spoke with patient about results and he verbalized understanding and had no questions

## 2014-06-04 NOTE — Progress Notes (Signed)
Quick Note:  LMTCB ______ 

## 2014-06-05 ENCOUNTER — Encounter: Payer: Self-pay | Admitting: Internal Medicine

## 2014-06-05 NOTE — Assessment & Plan Note (Signed)
He has a classic ACEi daytime cough but at this point it is a nuisance and he is happy with his bp control so ok to stay on it

## 2014-06-05 NOTE — Assessment & Plan Note (Signed)
Clearly does not need day time 02 at rest or with walking > will repeat ono RA and see if safe to d/c noct 02

## 2014-06-05 NOTE — Assessment & Plan Note (Addendum)
-  04/23/2014  Walked RA x 3 laps @ 185 ft each stopped due to end of study, no sob or desat at a nl pace  - 04/23/14  ESR 58 vs bnp 155 >  ESR 43 06/03/14 so unlikely related to ongoing use of amiodarone  - 06/03/2014  Walked RA x 3 laps @ 185 ft each stopped due to nl pace, no sob, no desat  - PFTs 06/03/2014   FEV1  2.68 (91%) ratio 69 and no change with saba, dlco 54 corrects 72   Really not limited by breathing at this point and should try to do more to prevent geriatric deconditioning if possible but no further pulmonary f/u needed

## 2014-06-06 ENCOUNTER — Other Ambulatory Visit: Payer: Self-pay | Admitting: Internal Medicine

## 2014-06-12 ENCOUNTER — Ambulatory Visit (INDEPENDENT_AMBULATORY_CARE_PROVIDER_SITE_OTHER): Payer: Medicare Other | Admitting: *Deleted

## 2014-06-12 DIAGNOSIS — I4891 Unspecified atrial fibrillation: Secondary | ICD-10-CM

## 2014-06-12 DIAGNOSIS — Z7901 Long term (current) use of anticoagulants: Secondary | ICD-10-CM

## 2014-06-12 DIAGNOSIS — Z5181 Encounter for therapeutic drug level monitoring: Secondary | ICD-10-CM

## 2014-06-12 LAB — POCT INR: INR: 2.4

## 2014-06-13 ENCOUNTER — Encounter: Payer: Self-pay | Admitting: Internal Medicine

## 2014-06-16 ENCOUNTER — Other Ambulatory Visit: Payer: Self-pay | Admitting: Internal Medicine

## 2014-06-16 DIAGNOSIS — E785 Hyperlipidemia, unspecified: Secondary | ICD-10-CM

## 2014-06-16 MED ORDER — ATORVASTATIN CALCIUM 80 MG PO TABS: 80.0000 mg | ORAL_TABLET | Freq: Every day | ORAL | Status: DC

## 2014-06-19 ENCOUNTER — Other Ambulatory Visit: Payer: Self-pay | Admitting: Internal Medicine

## 2014-06-19 DIAGNOSIS — R0902 Hypoxemia: Secondary | ICD-10-CM

## 2014-07-01 ENCOUNTER — Ambulatory Visit (INDEPENDENT_AMBULATORY_CARE_PROVIDER_SITE_OTHER): Payer: Medicare Other

## 2014-07-01 DIAGNOSIS — Z7901 Long term (current) use of anticoagulants: Secondary | ICD-10-CM

## 2014-07-01 DIAGNOSIS — Z5181 Encounter for therapeutic drug level monitoring: Secondary | ICD-10-CM

## 2014-07-01 DIAGNOSIS — I4891 Unspecified atrial fibrillation: Secondary | ICD-10-CM

## 2014-07-01 LAB — POCT INR: INR: 1.8

## 2014-07-14 ENCOUNTER — Ambulatory Visit (INDEPENDENT_AMBULATORY_CARE_PROVIDER_SITE_OTHER): Payer: Medicare Other | Admitting: *Deleted

## 2014-07-14 DIAGNOSIS — I255 Ischemic cardiomyopathy: Secondary | ICD-10-CM

## 2014-07-14 DIAGNOSIS — Z7901 Long term (current) use of anticoagulants: Secondary | ICD-10-CM

## 2014-07-14 DIAGNOSIS — I4891 Unspecified atrial fibrillation: Secondary | ICD-10-CM

## 2014-07-14 DIAGNOSIS — I5042 Chronic combined systolic (congestive) and diastolic (congestive) heart failure: Secondary | ICD-10-CM

## 2014-07-14 DIAGNOSIS — Z5181 Encounter for therapeutic drug level monitoring: Secondary | ICD-10-CM

## 2014-07-14 LAB — POCT INR: INR: 2.6

## 2014-07-14 NOTE — Progress Notes (Signed)
Remote ICD transmission.   

## 2014-07-16 LAB — MDC_IDC_ENUM_SESS_TYPE_REMOTE
Battery Remaining Longevity: 82 mo
Battery Remaining Percentage: 90 %
Battery Voltage: 3.08 V
Brady Statistic AP VP Percent: 8.8 %
Brady Statistic AP VS Percent: 91 %
Brady Statistic AS VP Percent: 1 %
Brady Statistic RA Percent Paced: 99 %
Date Time Interrogation Session: 20160222070017
HighPow Impedance: 39 Ohm
HighPow Impedance: 39 Ohm
Lead Channel Impedance Value: 340 Ohm
Lead Channel Pacing Threshold Amplitude: 0.75 V
Lead Channel Pacing Threshold Pulse Width: 0.5 ms
Lead Channel Sensing Intrinsic Amplitude: 1.5 mV
Lead Channel Setting Pacing Amplitude: 2.5 V
Lead Channel Setting Pacing Pulse Width: 0.5 ms
MDC IDC MSMT LEADCHNL RA IMPEDANCE VALUE: 400 Ohm
MDC IDC MSMT LEADCHNL RA PACING THRESHOLD PULSEWIDTH: 0.5 ms
MDC IDC MSMT LEADCHNL RV PACING THRESHOLD AMPLITUDE: 1.25 V
MDC IDC MSMT LEADCHNL RV SENSING INTR AMPL: 9 mV
MDC IDC PG SERIAL: 7194807
MDC IDC SET LEADCHNL RA PACING AMPLITUDE: 2 V
MDC IDC SET LEADCHNL RV SENSING SENSITIVITY: 0.5 mV
MDC IDC STAT BRADY AS VS PERCENT: 1 %
MDC IDC STAT BRADY RV PERCENT PACED: 8.8 %
Zone Setting Detection Interval: 280 ms
Zone Setting Detection Interval: 475 ms

## 2014-07-30 ENCOUNTER — Encounter: Payer: Self-pay | Admitting: *Deleted

## 2014-07-31 ENCOUNTER — Encounter: Payer: Self-pay | Admitting: Internal Medicine

## 2014-08-11 ENCOUNTER — Ambulatory Visit (INDEPENDENT_AMBULATORY_CARE_PROVIDER_SITE_OTHER): Payer: Medicare Other | Admitting: *Deleted

## 2014-08-11 DIAGNOSIS — Z7901 Long term (current) use of anticoagulants: Secondary | ICD-10-CM

## 2014-08-11 DIAGNOSIS — Z5181 Encounter for therapeutic drug level monitoring: Secondary | ICD-10-CM

## 2014-08-11 DIAGNOSIS — I4891 Unspecified atrial fibrillation: Secondary | ICD-10-CM

## 2014-08-11 LAB — POCT INR: INR: 2.4

## 2014-08-28 ENCOUNTER — Encounter: Payer: Self-pay | Admitting: Internal Medicine

## 2014-08-28 ENCOUNTER — Ambulatory Visit (INDEPENDENT_AMBULATORY_CARE_PROVIDER_SITE_OTHER): Payer: Medicare Other | Admitting: Internal Medicine

## 2014-08-28 ENCOUNTER — Other Ambulatory Visit (INDEPENDENT_AMBULATORY_CARE_PROVIDER_SITE_OTHER): Payer: Medicare Other

## 2014-08-28 VITALS — BP 120/80 | HR 92 | Temp 98.5°F | Resp 16 | Ht 70.0 in | Wt 216.0 lb

## 2014-08-28 DIAGNOSIS — I10 Essential (primary) hypertension: Secondary | ICD-10-CM

## 2014-08-28 DIAGNOSIS — E785 Hyperlipidemia, unspecified: Secondary | ICD-10-CM | POA: Diagnosis not present

## 2014-08-28 DIAGNOSIS — D510 Vitamin B12 deficiency anemia due to intrinsic factor deficiency: Secondary | ICD-10-CM | POA: Diagnosis not present

## 2014-08-28 DIAGNOSIS — I5042 Chronic combined systolic (congestive) and diastolic (congestive) heart failure: Secondary | ICD-10-CM | POA: Diagnosis not present

## 2014-08-28 DIAGNOSIS — Z0000 Encounter for general adult medical examination without abnormal findings: Secondary | ICD-10-CM | POA: Diagnosis not present

## 2014-08-28 LAB — CBC WITH DIFFERENTIAL/PLATELET
Basophils Absolute: 0 10*3/uL (ref 0.0–0.1)
Basophils Relative: 0.7 % (ref 0.0–3.0)
EOS PCT: 3.2 % (ref 0.0–5.0)
Eosinophils Absolute: 0.2 10*3/uL (ref 0.0–0.7)
HCT: 38 % — ABNORMAL LOW (ref 39.0–52.0)
HEMOGLOBIN: 12.9 g/dL — AB (ref 13.0–17.0)
LYMPHS PCT: 10.8 % — AB (ref 12.0–46.0)
Lymphs Abs: 0.6 10*3/uL — ABNORMAL LOW (ref 0.7–4.0)
MCHC: 33.8 g/dL (ref 30.0–36.0)
MCV: 91.6 fl (ref 78.0–100.0)
MONOS PCT: 7.7 % (ref 3.0–12.0)
Monocytes Absolute: 0.5 10*3/uL (ref 0.1–1.0)
Neutro Abs: 4.6 10*3/uL (ref 1.4–7.7)
Neutrophils Relative %: 77.6 % — ABNORMAL HIGH (ref 43.0–77.0)
Platelets: 202 10*3/uL (ref 150.0–400.0)
RBC: 4.15 Mil/uL — ABNORMAL LOW (ref 4.22–5.81)
RDW: 13.6 % (ref 11.5–15.5)
WBC: 5.9 10*3/uL (ref 4.0–10.5)

## 2014-08-28 LAB — BASIC METABOLIC PANEL
BUN: 21 mg/dL (ref 6–23)
CALCIUM: 9.2 mg/dL (ref 8.4–10.5)
CHLORIDE: 102 meq/L (ref 96–112)
CO2: 27 mEq/L (ref 19–32)
Creatinine, Ser: 1.19 mg/dL (ref 0.40–1.50)
GFR: 62.72 mL/min (ref >60.00)
Glucose, Bld: 87 mg/dL (ref 70–99)
POTASSIUM: 4.8 meq/L (ref 3.5–5.1)
Sodium: 135 mEq/L (ref 135–145)

## 2014-08-28 LAB — LIPID PANEL
CHOL/HDL RATIO: 4
Cholesterol: 143 mg/dL (ref 0–200)
HDL: 40.2 mg/dL (ref >39.00)
LDL Cholesterol: 83 mg/dL (ref 0–99)
NONHDL: 102.8
Triglycerides: 100 mg/dL (ref 0.0–149.0)
VLDL: 20 mg/dL (ref 0.0–40.0)

## 2014-08-28 LAB — FECAL OCCULT BLOOD, GUAIAC: Fecal Occult Blood: NEGATIVE

## 2014-08-28 LAB — TSH: TSH: 2.17 u[IU]/mL (ref 0.35–4.50)

## 2014-08-28 NOTE — Patient Instructions (Signed)

## 2014-08-28 NOTE — Progress Notes (Signed)
Subjective:    Patient ID: [name], male    DOB: May 30, 1933, 81 y.o.   MRN: [id number]  Hypertension This is a chronic problem. The current episode started more than 1 year ago. The problem is unchanged. The problem is controlled. Pertinent negatives include no anxiety, blurred vision, chest pain, headaches, malaise/fatigue, neck pain, orthopnea, palpitations, peripheral edema, PND, shortness of breath or sweats. There are no associated agents to hypertension. Past treatments include ACE inhibitors and beta blockers. The current treatment provides moderate improvement. There are no compliance problems.  Hypertensive end-organ damage includes heart failure and left ventricular hypertrophy.      Review of Systems  Constitutional: Negative.  Negative for fever, chills, malaise/fatigue, diaphoresis, activity change, appetite change and fatigue.  HENT: Negative.   Eyes: Negative.  Negative for blurred vision.  Respiratory: Negative.  Negative for cough, choking, chest tightness, shortness of breath and stridor.   Cardiovascular: Negative.  Negative for chest pain, palpitations, orthopnea, leg swelling and PND.  Gastrointestinal: Positive for blood in stool and anal bleeding. Negative for nausea, vomiting, abdominal pain, diarrhea, constipation, abdominal distention and rectal pain.       He was straining to have a BM about 3 weeks ago and after that he noticed some blood on the toilet paper, there has not been any pain or bleeding since then.  Endocrine: Negative.   Genitourinary: Negative.  Negative for difficulty urinating.  Musculoskeletal: Negative.  Negative for neck pain.  Skin: Negative.   Allergic/Immunologic: Negative.   Neurological: Negative.  Negative for dizziness, tremors, syncope, light-headedness, numbness and headaches.  Hematological: Negative.  Negative for adenopathy. Does not bruise/bleed easily.  Psychiatric/Behavioral: Negative.        Objective:   Physical  Exam  Constitutional: He is oriented to person, place, and time. He appears well-developed and well-nourished. No distress.  HENT:  Head: Normocephalic and atraumatic.  Mouth/Throat: Oropharynx is clear and moist. No oropharyngeal exudate.  Eyes: Conjunctivae are normal. Right eye exhibits no discharge. Left eye exhibits no discharge. No scleral icterus.  Neck: Normal range of motion. Neck supple. No JVD present. No tracheal deviation present. No thyromegaly present.  Cardiovascular: Normal rate, regular rhythm, normal heart sounds and intact distal pulses.  Exam reveals no gallop and no friction rub.   No murmur heard. Pulmonary/Chest: Effort normal and breath sounds normal. No stridor. No respiratory distress. He has no wheezes. He has no rales. He exhibits no tenderness.  Abdominal: Soft. Bowel sounds are normal. He exhibits no distension and no mass. There is no tenderness. There is no rebound and no guarding. Hernia confirmed negative in the right inguinal area and confirmed negative in the left inguinal area.  Genitourinary: Testes normal and penis normal. Rectal exam shows external hemorrhoid and internal hemorrhoid. Rectal exam shows no fissure, no mass, no tenderness and anal tone normal. Guaiac negative stool. Prostate is enlarged (1+ smooth symm BPH). Prostate is not tender. Right testis shows no mass, no swelling and no tenderness. Right testis is descended. Left testis shows no mass, no swelling and no tenderness. Left testis is descended. Uncircumcised. No phimosis, paraphimosis, hypospadias, penile erythema or penile tenderness. No discharge found.  Musculoskeletal: Normal range of motion. He exhibits no edema or tenderness.  Lymphadenopathy:    He has no cervical adenopathy.       Right: No inguinal adenopathy present.       Left: No inguinal adenopathy present.  Neurological: He is oriented to person, place, and time.  Skin: Skin is warm and dry. No rash noted. He is not  diaphoretic. No erythema. No pallor.  Psychiatric: He has a normal mood and affect. His behavior is normal. Judgment and thought content normal.  Vitals reviewed.     Lab Results  Component Value Date   WBC 9.0 04/23/2014   HGB 12.3* 04/23/2014   HCT 37.2* 04/23/2014   PLT 227.0 04/23/2014   GLUCOSE 126* 04/08/2014   CHOL 110 09/30/2013   TRIG 79.0 09/30/2013   HDL 49.50 09/30/2013   LDLCALC 45 09/30/2013   ALT 28 02/06/2014   AST 41* 02/06/2014   NA 143 04/08/2014   K 4.7 04/08/2014   CL 106 04/08/2014   CREATININE 1.10 04/08/2014   BUN 17 04/08/2014   CO2 28 04/08/2014   TSH 2.18 04/23/2014   INR 2.4 08/11/2014      Assessment & Plan:

## 2014-08-28 NOTE — Assessment & Plan Note (Signed)
His BP is well controlled I will monitor his lytes and renal function 

## 2014-08-28 NOTE — Assessment & Plan Note (Signed)

## 2014-08-28 NOTE — Progress Notes (Signed)
Pre visit review using our clinic review tool, if applicable. No additional management support is needed unless otherwise documented below in the visit note. 

## 2014-08-28 NOTE — Assessment & Plan Note (Signed)
He has a normal volume status today Will cont the current meds 

## 2014-08-28 NOTE — Assessment & Plan Note (Signed)
I will recheck his CBC to see if this has changed Will evaluate further if needed

## 2014-09-15 ENCOUNTER — Ambulatory Visit (INDEPENDENT_AMBULATORY_CARE_PROVIDER_SITE_OTHER): Payer: Medicare Other | Admitting: *Deleted

## 2014-09-15 DIAGNOSIS — Z5181 Encounter for therapeutic drug level monitoring: Secondary | ICD-10-CM

## 2014-09-15 DIAGNOSIS — I4891 Unspecified atrial fibrillation: Secondary | ICD-10-CM | POA: Diagnosis not present

## 2014-09-15 DIAGNOSIS — Z7901 Long term (current) use of anticoagulants: Secondary | ICD-10-CM | POA: Diagnosis not present

## 2014-09-15 LAB — POCT INR: INR: 1.6

## 2014-09-25 ENCOUNTER — Encounter: Payer: Self-pay | Admitting: Internal Medicine

## 2014-10-15 ENCOUNTER — Ambulatory Visit (INDEPENDENT_AMBULATORY_CARE_PROVIDER_SITE_OTHER): Payer: Medicare Other

## 2014-10-15 ENCOUNTER — Encounter: Payer: Self-pay | Admitting: Internal Medicine

## 2014-10-15 ENCOUNTER — Ambulatory Visit (INDEPENDENT_AMBULATORY_CARE_PROVIDER_SITE_OTHER): Payer: Medicare Other | Admitting: Internal Medicine

## 2014-10-15 VITALS — BP 124/70 | HR 60 | Ht 70.0 in | Wt 207.2 lb

## 2014-10-15 DIAGNOSIS — Z79899 Other long term (current) drug therapy: Secondary | ICD-10-CM | POA: Diagnosis not present

## 2014-10-15 DIAGNOSIS — Z5181 Encounter for therapeutic drug level monitoring: Secondary | ICD-10-CM

## 2014-10-15 DIAGNOSIS — Z4502 Encounter for adjustment and management of automatic implantable cardiac defibrillator: Secondary | ICD-10-CM | POA: Diagnosis not present

## 2014-10-15 DIAGNOSIS — I255 Ischemic cardiomyopathy: Secondary | ICD-10-CM | POA: Diagnosis not present

## 2014-10-15 DIAGNOSIS — I472 Ventricular tachycardia: Secondary | ICD-10-CM | POA: Diagnosis not present

## 2014-10-15 DIAGNOSIS — I4729 Other ventricular tachycardia: Secondary | ICD-10-CM

## 2014-10-15 DIAGNOSIS — I4891 Unspecified atrial fibrillation: Secondary | ICD-10-CM

## 2014-10-15 LAB — CUP PACEART INCLINIC DEVICE CHECK
Brady Statistic RV Percent Paced: 11 %
Date Time Interrogation Session: 20160525110642
HighPow Impedance: 42.395
Lead Channel Impedance Value: 437.5 Ohm
Lead Channel Pacing Threshold Pulse Width: 0.5 ms
Lead Channel Sensing Intrinsic Amplitude: 12 mV
Lead Channel Setting Pacing Amplitude: 2 V
Lead Channel Setting Sensing Sensitivity: 0.5 mV
MDC IDC MSMT BATTERY REMAINING LONGEVITY: 82.8 mo
MDC IDC MSMT LEADCHNL RA PACING THRESHOLD AMPLITUDE: 0.75 V
MDC IDC MSMT LEADCHNL RA SENSING INTR AMPL: 1.4 mV
MDC IDC MSMT LEADCHNL RV IMPEDANCE VALUE: 375 Ohm
MDC IDC MSMT LEADCHNL RV PACING THRESHOLD AMPLITUDE: 1.25 V
MDC IDC MSMT LEADCHNL RV PACING THRESHOLD PULSEWIDTH: 0.7 ms
MDC IDC SET LEADCHNL RV PACING AMPLITUDE: 2.5 V
MDC IDC SET LEADCHNL RV PACING PULSEWIDTH: 0.7 ms
MDC IDC STAT BRADY RA PERCENT PACED: 99.54 %
Pulse Gen Serial Number: 7194807
Zone Setting Detection Interval: 280 ms
Zone Setting Detection Interval: 475 ms

## 2014-10-15 LAB — HEPATIC FUNCTION PANEL
ALK PHOS: 83 U/L (ref 39–117)
ALT: 31 U/L (ref 0–53)
AST: 27 U/L (ref 0–37)
Albumin: 4.1 g/dL (ref 3.5–5.2)
Bilirubin, Direct: 0.2 mg/dL (ref 0.0–0.3)
Total Bilirubin: 0.7 mg/dL (ref 0.2–1.2)
Total Protein: 6.9 g/dL (ref 6.0–8.3)

## 2014-10-15 LAB — POCT INR: INR: 1.6

## 2014-10-15 MED ORDER — METOPROLOL SUCCINATE ER 50 MG PO TB24: 50.0000 mg | ORAL_TABLET | Freq: Every day | ORAL | Status: DC

## 2014-10-15 NOTE — Patient Instructions (Addendum)
Medication Instructions:  Your physician has recommended you make the following change in your medication:  1) STOP Atenolol  You have been given 2 different prescriptions to try. You may take them in any order. DO NOT take these medications at the same time. Handwritten prescriptions were given to you for the following medications. 1) Metoprolol Succinate 50 mg daily 2) Carvedilol 12.5 mg twice daily   Labwork: Digoxin level & Liver function test today  Testing/Procedures: None ordered  Follow-Up: Remote monitoring is used to monitor your Pacemaker of ICD from home. This monitoring reduces the number of office visits required to check your device to one time per year. It allows [place] to keep an eye on the functioning of your device to ensure it is working properly. You are scheduled for a device check from home on 01/14/15. You may send your transmission at any time that day. If you have a wireless device, the transmission will be sent automatically. After your physician reviews your transmission, you will receive a postcard with your next transmission date.  Your physician wants you to follow-up in: 1 year with Dr. Graciela Husbands.  You will receive a reminder letter in the mail two months in advance. If you don't receive a letter, please call our office to schedule the follow-up appointment.   Any Other Special Instructions Will Be Listed Below (If Applicable). Please call us once you have made a decision on which medication is working best for you.

## 2014-10-15 NOTE — Progress Notes (Signed)
Patient Care Team: Etta Grandchild, MD as PCP - General (Internal Medicine) Duke Salvia, MD as Attending Physician (Cardiology)   HPI  [name] is a 81 y.o. male seen in followup for ventricular tachycardia and atrial fibrillation s/p ICD implantation with both appropriate and inappropriate therapy. He has a history of ischemic heart disease with prior CABG and moderate depression of LV systolic function.   He underwent device generator reimplantation 5/15.  He's had episodes of polymorphic and monomorphic ventricular tachycardi;  tikosyn was discontinued and amiodarone initiated.  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  Ejection fraction was 35-40% echo 2015    Past Medical History  Diagnosis Date  . DVT (deep venous thrombosis)   . Ischemic cardiomyopathy     CABG vx4 1986 and PTCA in 1991; stent 1997 cath 2008  . Dyslipidemia   . HTN (hypertension)   . Atrial fibrillation   . Ventricular tachycardia     With prior shock therapy and antitachycardia pacing  . Automatic implantable cardiac defibrillator St Judes 3/08    St. Jude  . History of colonoscopy 10/14/2002  . Coronary artery disease   . Fatigue 03/29/2012  . Arthritis     Past Surgical History  Procedure Laterality Date  . Cardiac defibrillator placement  3/08    St. Jude  . Ptca  1997  . Coronary artery bypass graft  1996    last cath reports an atretic LIMA to the LAD and patent SVG to RCA  . Implantable cardioverter defibrillator generator change N/A 10/07/2013    Procedure: IMPLANTABLE CARDIOVERTER DEFIBRILLATOR GENERATOR CHANGE;  Surgeon: Duke Salvia, MD;  Location: [facility] CATH LAB;  Service: Cardiovascular;  Laterality: N/A;    Current Outpatient Prescriptions   Medication Sig Dispense Refill  . amiodarone (PACERONE) 200 MG tablet Take 1 tablet (200 mg total) by mouth daily. 90 tablet 1  . atenolol (TENORMIN) 100 MG tablet Take 0.5 tablets (50 mg total) by mouth daily. 135 tablet 2  . atorvastatin (LIPITOR) 80 MG tablet Take 1 tablet (80 mg total) by mouth daily. 90 tablet 3  . clobetasol cream (TEMOVATE) 0.05 % Apply 1 application topically daily.     . digoxin (LANOXIN) 0.125 MG tablet Take 0.125 mg by mouth every other day.    . fish oil-omega-3 fatty acids 1000 MG capsule Take 1 g by mouth 2 (two) times daily.     . Multiple Vitamin (MULTIVITAMIN) tablet Take 1 tablet by mouth daily.     . nitroGLYCERIN (NITROSTAT) 0.4 MG SL tablet Place 0.4 mg under the tongue every 5 (five) minutes as needed for chest pain.     . ramipril (ALTACE) 5 MG capsule Take 1 capsule (5 mg total) by mouth daily. 90 capsule 2  . warfarin (COUMADIN) 2 MG tablet TAKE AS DIRECTED BY COUMADIN CLINIC 120 tablet 1   No current facility-administered medications for this visit.    No Known Allergies  Review of Systems negative except from HPI and PMH  Physical Exam BP 124/70 mmHg  Pulse 60  Ht  (1.778 m)  Wt 207 lb 3.2 oz (93.985 kg)  BMI 29.73 kg/m2 Well developed and nourished in no acute distress HENT normal Neck supple with JVP-flat Clear Device pocket  well healed; without hematoma or erythema.  There is no tethering Regular rate and rhythm heart sounds are faint and there is a 2/6 systolic murmur  Abd-soft with active BS No Clubbing cyanosis edema Skin-warm and dry A & Oriented  Grossly normal sensory and motor function  ECG A pacing with intervals 34/19/44   Assessment and  Plan  Ventricular tachycardia-polymorphic/monomorphic  Ischemic cardiomyopathy with prior bypass; ejection fraction 35-40% 5/15  Implantable defibrillator  St Jude  The patient's device was interrogated and the information was fully reviewed.  The device was reprogrammed to  minimize drain on the battery. His ventricular pacing chamber.  Fatigue  Sinus node dysfunction  Left bundle branch block  First-degree AV block  Fatigue improved with down titration of his beta blocker; however, with his depressed left ventricular function, we will switch him to a guideline directed recommendation either carvedilol or metoprolol. I have given him prescriptions for both, 12.5 twice a day and 50. He will let us know which of these she tolerates better.  Euvolemic continue current meds   No intercurrent Ventricular tachycardia  Nonsustained atrial tachycardia is noted.  He has no intrinsic sinus rate; heart rate excursion is reasonable.  first degree AV block his profound; his symptoms however did not justify reconfigure in his device was CRT upgrade.

## 2014-10-16 LAB — DIGOXIN LEVEL: DIGOXIN LVL: 0.9 ng/mL (ref 0.8–2.0)

## 2014-10-29 ENCOUNTER — Ambulatory Visit (INDEPENDENT_AMBULATORY_CARE_PROVIDER_SITE_OTHER): Payer: Medicare Other | Admitting: *Deleted

## 2014-10-29 DIAGNOSIS — I4891 Unspecified atrial fibrillation: Secondary | ICD-10-CM

## 2014-10-29 DIAGNOSIS — Z7901 Long term (current) use of anticoagulants: Secondary | ICD-10-CM | POA: Diagnosis not present

## 2014-10-29 DIAGNOSIS — Z5181 Encounter for therapeutic drug level monitoring: Secondary | ICD-10-CM

## 2014-10-29 LAB — POCT INR: INR: 2.7

## 2014-10-31 ENCOUNTER — Other Ambulatory Visit: Payer: Self-pay | Admitting: *Deleted

## 2014-10-31 MED ORDER — DIGOXIN 125 MCG PO TABS: 0.1250 mg | ORAL_TABLET | ORAL | Status: DC

## 2014-11-10 ENCOUNTER — Telehealth: Payer: Self-pay | Admitting: Internal Medicine

## 2014-11-10 MED ORDER — METOPROLOL SUCCINATE ER 50 MG PO TB24: 50.0000 mg | ORAL_TABLET | Freq: Every day | ORAL | Status: DC

## 2014-11-10 NOTE — Telephone Encounter (Signed)
Patient states Metoprolol Succinate 50 mg daily is working well for him. Requested rx sent to [facility] per pt request.

## 2014-11-10 NOTE — Telephone Encounter (Signed)
New Message       Pt calling stating that he was told to call back and let Sherri know which medication he felt the best on and he states it is Metoprolol. Pt states he needs a prescription for this. Please call back and advise.

## 2014-11-27 ENCOUNTER — Ambulatory Visit (INDEPENDENT_AMBULATORY_CARE_PROVIDER_SITE_OTHER): Payer: Medicare Other | Admitting: *Deleted

## 2014-11-27 DIAGNOSIS — Z7901 Long term (current) use of anticoagulants: Secondary | ICD-10-CM | POA: Diagnosis not present

## 2014-11-27 DIAGNOSIS — I4891 Unspecified atrial fibrillation: Secondary | ICD-10-CM

## 2014-11-27 DIAGNOSIS — Z5181 Encounter for therapeutic drug level monitoring: Secondary | ICD-10-CM

## 2014-11-27 LAB — POCT INR: INR: 2

## 2014-12-08 ENCOUNTER — Other Ambulatory Visit: Payer: Self-pay | Admitting: Internal Medicine

## 2014-12-25 ENCOUNTER — Ambulatory Visit (INDEPENDENT_AMBULATORY_CARE_PROVIDER_SITE_OTHER): Payer: Medicare Other | Admitting: Pharmacist

## 2014-12-25 DIAGNOSIS — Z5181 Encounter for therapeutic drug level monitoring: Secondary | ICD-10-CM | POA: Diagnosis not present

## 2014-12-25 DIAGNOSIS — I4891 Unspecified atrial fibrillation: Secondary | ICD-10-CM | POA: Diagnosis not present

## 2014-12-25 LAB — POCT INR: INR: 2

## 2014-12-25 MED ORDER — WARFARIN SODIUM 2 MG PO TABS: 2.0000 mg | ORAL_TABLET | Freq: Every day | ORAL | Status: DC

## 2015-01-14 ENCOUNTER — Ambulatory Visit (INDEPENDENT_AMBULATORY_CARE_PROVIDER_SITE_OTHER): Payer: Medicare Other | Admitting: *Deleted

## 2015-01-14 DIAGNOSIS — I5042 Chronic combined systolic (congestive) and diastolic (congestive) heart failure: Secondary | ICD-10-CM | POA: Diagnosis not present

## 2015-01-14 DIAGNOSIS — I255 Ischemic cardiomyopathy: Secondary | ICD-10-CM

## 2015-01-14 NOTE — Progress Notes (Signed)
Remote ICD transmission.   

## 2015-01-17 LAB — CUP PACEART REMOTE DEVICE CHECK
Battery Remaining Percentage: 85 %
Battery Voltage: 2.99 V
Brady Statistic AP VP Percent: 12 %
Brady Statistic AP VS Percent: 88 %
Brady Statistic AS VP Percent: 1 %
Brady Statistic AS VS Percent: 1 %
Brady Statistic RV Percent Paced: 12 %
Date Time Interrogation Session: 20160824060020
HIGH POWER IMPEDANCE MEASURED VALUE: 40 Ohm
HighPow Impedance: 40 Ohm
Lead Channel Impedance Value: 350 Ohm
Lead Channel Pacing Threshold Amplitude: 0.75 V
Lead Channel Pacing Threshold Pulse Width: 0.5 ms
Lead Channel Sensing Intrinsic Amplitude: 12 mV
Lead Channel Setting Pacing Amplitude: 2 V
Lead Channel Setting Pacing Amplitude: 2.5 V
Lead Channel Setting Pacing Pulse Width: 0.7 ms
MDC IDC MSMT BATTERY REMAINING LONGEVITY: 76 mo
MDC IDC MSMT LEADCHNL RA IMPEDANCE VALUE: 400 Ohm
MDC IDC MSMT LEADCHNL RA SENSING INTR AMPL: 0.8 mV
MDC IDC MSMT LEADCHNL RV PACING THRESHOLD AMPLITUDE: 1.25 V
MDC IDC MSMT LEADCHNL RV PACING THRESHOLD PULSEWIDTH: 0.7 ms
MDC IDC SET LEADCHNL RV SENSING SENSITIVITY: 0.5 mV
MDC IDC SET ZONE DETECTION INTERVAL: 475 ms
MDC IDC STAT BRADY RA PERCENT PACED: 99 %
Pulse Gen Serial Number: 7194807
Zone Setting Detection Interval: 280 ms

## 2015-01-30 ENCOUNTER — Encounter: Payer: Self-pay | Admitting: Cardiology

## 2015-02-05 ENCOUNTER — Ambulatory Visit (INDEPENDENT_AMBULATORY_CARE_PROVIDER_SITE_OTHER): Payer: Medicare Other | Admitting: *Deleted

## 2015-02-05 DIAGNOSIS — I4891 Unspecified atrial fibrillation: Secondary | ICD-10-CM

## 2015-02-05 DIAGNOSIS — Z5181 Encounter for therapeutic drug level monitoring: Secondary | ICD-10-CM | POA: Diagnosis not present

## 2015-02-05 LAB — POCT INR: INR: 2

## 2015-02-11 ENCOUNTER — Encounter: Payer: Self-pay | Admitting: Internal Medicine

## 2015-02-20 ENCOUNTER — Ambulatory Visit (INDEPENDENT_AMBULATORY_CARE_PROVIDER_SITE_OTHER): Payer: Medicare Other

## 2015-02-20 DIAGNOSIS — Z23 Encounter for immunization: Secondary | ICD-10-CM

## 2015-03-02 ENCOUNTER — Other Ambulatory Visit: Payer: Self-pay | Admitting: Internal Medicine

## 2015-03-19 ENCOUNTER — Ambulatory Visit (INDEPENDENT_AMBULATORY_CARE_PROVIDER_SITE_OTHER): Payer: Medicare Other | Admitting: *Deleted

## 2015-03-19 DIAGNOSIS — I4891 Unspecified atrial fibrillation: Secondary | ICD-10-CM

## 2015-03-19 DIAGNOSIS — Z5181 Encounter for therapeutic drug level monitoring: Secondary | ICD-10-CM | POA: Diagnosis not present

## 2015-03-19 LAB — POCT INR: INR: 2.2

## 2015-04-15 ENCOUNTER — Ambulatory Visit (INDEPENDENT_AMBULATORY_CARE_PROVIDER_SITE_OTHER): Payer: Medicare Other | Admitting: *Deleted

## 2015-04-15 DIAGNOSIS — I5042 Chronic combined systolic (congestive) and diastolic (congestive) heart failure: Secondary | ICD-10-CM

## 2015-04-15 DIAGNOSIS — I255 Ischemic cardiomyopathy: Secondary | ICD-10-CM

## 2015-04-15 NOTE — Progress Notes (Signed)
Remote ICD transmission.   

## 2015-04-27 LAB — CUP PACEART REMOTE DEVICE CHECK
Battery Remaining Longevity: 73 mo
Battery Remaining Percentage: 83 %
Brady Statistic AP VS Percent: 86 %
Brady Statistic AS VP Percent: 1 %
Brady Statistic AS VS Percent: 1 %
Brady Statistic RV Percent Paced: 14 %
HIGH POWER IMPEDANCE MEASURED VALUE: 35 Ohm
HighPow Impedance: 36 Ohm
Implantable Lead Implant Date: 20080324
Lead Channel Impedance Value: 340 Ohm
Lead Channel Pacing Threshold Amplitude: 0.75 V
Lead Channel Pacing Threshold Amplitude: 1.25 V
Lead Channel Pacing Threshold Pulse Width: 0.7 ms
Lead Channel Sensing Intrinsic Amplitude: 1.3 mV
Lead Channel Setting Sensing Sensitivity: 0.5 mV
MDC IDC LEAD IMPLANT DT: 20080324
MDC IDC LEAD LOCATION: 753859
MDC IDC LEAD LOCATION: 753860
MDC IDC LEAD MODEL: 7120
MDC IDC MSMT BATTERY VOLTAGE: 2.98 V
MDC IDC MSMT LEADCHNL RA IMPEDANCE VALUE: 390 Ohm
MDC IDC MSMT LEADCHNL RA PACING THRESHOLD PULSEWIDTH: 0.5 ms
MDC IDC MSMT LEADCHNL RV SENSING INTR AMPL: 9.1 mV
MDC IDC PG SERIAL: 7194807
MDC IDC SESS DTM: 20161123070015
MDC IDC SET LEADCHNL RA PACING AMPLITUDE: 2 V
MDC IDC SET LEADCHNL RV PACING AMPLITUDE: 2.5 V
MDC IDC SET LEADCHNL RV PACING PULSEWIDTH: 0.7 ms
MDC IDC STAT BRADY AP VP PERCENT: 14 %
MDC IDC STAT BRADY RA PERCENT PACED: 99 %

## 2015-04-28 ENCOUNTER — Encounter: Payer: Self-pay | Admitting: Cardiology

## 2015-04-30 ENCOUNTER — Ambulatory Visit (INDEPENDENT_AMBULATORY_CARE_PROVIDER_SITE_OTHER): Payer: Medicare Other | Admitting: *Deleted

## 2015-04-30 DIAGNOSIS — I4891 Unspecified atrial fibrillation: Secondary | ICD-10-CM

## 2015-04-30 DIAGNOSIS — Z7901 Long term (current) use of anticoagulants: Secondary | ICD-10-CM | POA: Diagnosis not present

## 2015-04-30 DIAGNOSIS — Z5181 Encounter for therapeutic drug level monitoring: Secondary | ICD-10-CM | POA: Diagnosis not present

## 2015-04-30 LAB — POCT INR: INR: 1.5

## 2015-05-14 ENCOUNTER — Ambulatory Visit (INDEPENDENT_AMBULATORY_CARE_PROVIDER_SITE_OTHER): Payer: Medicare Other | Admitting: *Deleted

## 2015-05-14 DIAGNOSIS — I4891 Unspecified atrial fibrillation: Secondary | ICD-10-CM | POA: Diagnosis not present

## 2015-05-14 DIAGNOSIS — Z5181 Encounter for therapeutic drug level monitoring: Secondary | ICD-10-CM

## 2015-05-14 LAB — POCT INR: INR: 1.6

## 2015-06-04 ENCOUNTER — Ambulatory Visit (INDEPENDENT_AMBULATORY_CARE_PROVIDER_SITE_OTHER): Payer: Medicare Other | Admitting: *Deleted

## 2015-06-04 DIAGNOSIS — Z5181 Encounter for therapeutic drug level monitoring: Secondary | ICD-10-CM | POA: Diagnosis not present

## 2015-06-04 DIAGNOSIS — I4891 Unspecified atrial fibrillation: Secondary | ICD-10-CM

## 2015-06-04 LAB — POCT INR: INR: 1.6

## 2015-06-08 ENCOUNTER — Encounter: Payer: Self-pay | Admitting: Cardiology

## 2015-06-08 ENCOUNTER — Encounter: Payer: Self-pay | Admitting: Cardiovascular Disease

## 2015-06-10 DIAGNOSIS — H2512 Age-related nuclear cataract, left eye: Secondary | ICD-10-CM | POA: Diagnosis not present

## 2015-06-25 ENCOUNTER — Ambulatory Visit (INDEPENDENT_AMBULATORY_CARE_PROVIDER_SITE_OTHER): Payer: Medicare Other | Admitting: *Deleted

## 2015-06-25 DIAGNOSIS — Z7901 Long term (current) use of anticoagulants: Secondary | ICD-10-CM | POA: Diagnosis not present

## 2015-06-25 DIAGNOSIS — I4891 Unspecified atrial fibrillation: Secondary | ICD-10-CM | POA: Diagnosis not present

## 2015-06-25 DIAGNOSIS — Z5181 Encounter for therapeutic drug level monitoring: Secondary | ICD-10-CM

## 2015-06-25 LAB — POCT INR: INR: 2.2

## 2015-07-01 ENCOUNTER — Other Ambulatory Visit: Payer: Self-pay | Admitting: Internal Medicine

## 2015-07-01 NOTE — Telephone Encounter (Signed)
Pt calling to request refill of Amiodarone at [facility] [address]-pt changed pharmacy-requests 90 day supply with refills

## 2015-07-02 ENCOUNTER — Other Ambulatory Visit: Payer: Self-pay | Admitting: Internal Medicine

## 2015-07-07 ENCOUNTER — Telehealth: Payer: Self-pay | Admitting: Internal Medicine

## 2015-07-07 ENCOUNTER — Other Ambulatory Visit: Payer: Self-pay | Admitting: *Deleted

## 2015-07-07 MED ORDER — AMIODARONE HCL 200 MG PO TABS: ORAL_TABLET | ORAL | Status: DC

## 2015-07-07 NOTE — Telephone Encounter (Signed)
°*  STAT* If patient is at the pharmacy, call can be transferred to refill team.   1. Which medications need to be refilled? (please list name of each medication and dose if known) Amiodarone-Pt been trying to get it for a while,been out of it for 3days2. Which pharmacy/location (including street and city if local pharmacy) is medication to be sent to?[facility] 3. Do they need a 30 day or 90 day supply? 90 and refills

## 2015-07-15 ENCOUNTER — Ambulatory Visit (INDEPENDENT_AMBULATORY_CARE_PROVIDER_SITE_OTHER): Payer: Medicare Other | Admitting: *Deleted

## 2015-07-15 DIAGNOSIS — I5042 Chronic combined systolic (congestive) and diastolic (congestive) heart failure: Secondary | ICD-10-CM

## 2015-07-15 DIAGNOSIS — I255 Ischemic cardiomyopathy: Secondary | ICD-10-CM | POA: Diagnosis not present

## 2015-07-15 NOTE — Progress Notes (Signed)
Remote ICD transmission.   

## 2015-07-16 ENCOUNTER — Ambulatory Visit (INDEPENDENT_AMBULATORY_CARE_PROVIDER_SITE_OTHER): Payer: Medicare Other | Admitting: *Deleted

## 2015-07-16 DIAGNOSIS — Z5181 Encounter for therapeutic drug level monitoring: Secondary | ICD-10-CM

## 2015-07-16 DIAGNOSIS — Z7901 Long term (current) use of anticoagulants: Secondary | ICD-10-CM | POA: Diagnosis not present

## 2015-07-16 DIAGNOSIS — I4891 Unspecified atrial fibrillation: Secondary | ICD-10-CM | POA: Diagnosis not present

## 2015-07-16 LAB — POCT INR: INR: 2

## 2015-07-20 ENCOUNTER — Telehealth: Payer: Self-pay | Admitting: Internal Medicine

## 2015-07-20 DIAGNOSIS — E785 Hyperlipidemia, unspecified: Secondary | ICD-10-CM

## 2015-07-20 MED ORDER — ATORVASTATIN CALCIUM 80 MG PO TABS: 80.0000 mg | ORAL_TABLET | Freq: Every day | ORAL | Status: DC

## 2015-07-20 NOTE — Telephone Encounter (Signed)
Pt requesting refill for atorvastatin (LIPITOR) 80 MG tablet [[id number] He needs a 90 day supply  [facility] [address]

## 2015-07-20 NOTE — Telephone Encounter (Signed)
He is overdue for a physical and labs. Has not been seen since 08/28/14 He will need an appt for further refills 90 day supply has been sent

## 2015-07-20 NOTE — Telephone Encounter (Signed)
appt made

## 2015-07-27 IMAGING — CR DG CHEST 2V
2 series · 2 of 2 positions shown · non-contrast
Comparison: 02/06/2014

CLINICAL DATA: Followup pneumonia

EXAM:
CHEST  2 VIEW

[view not recorded (1 of 2)]
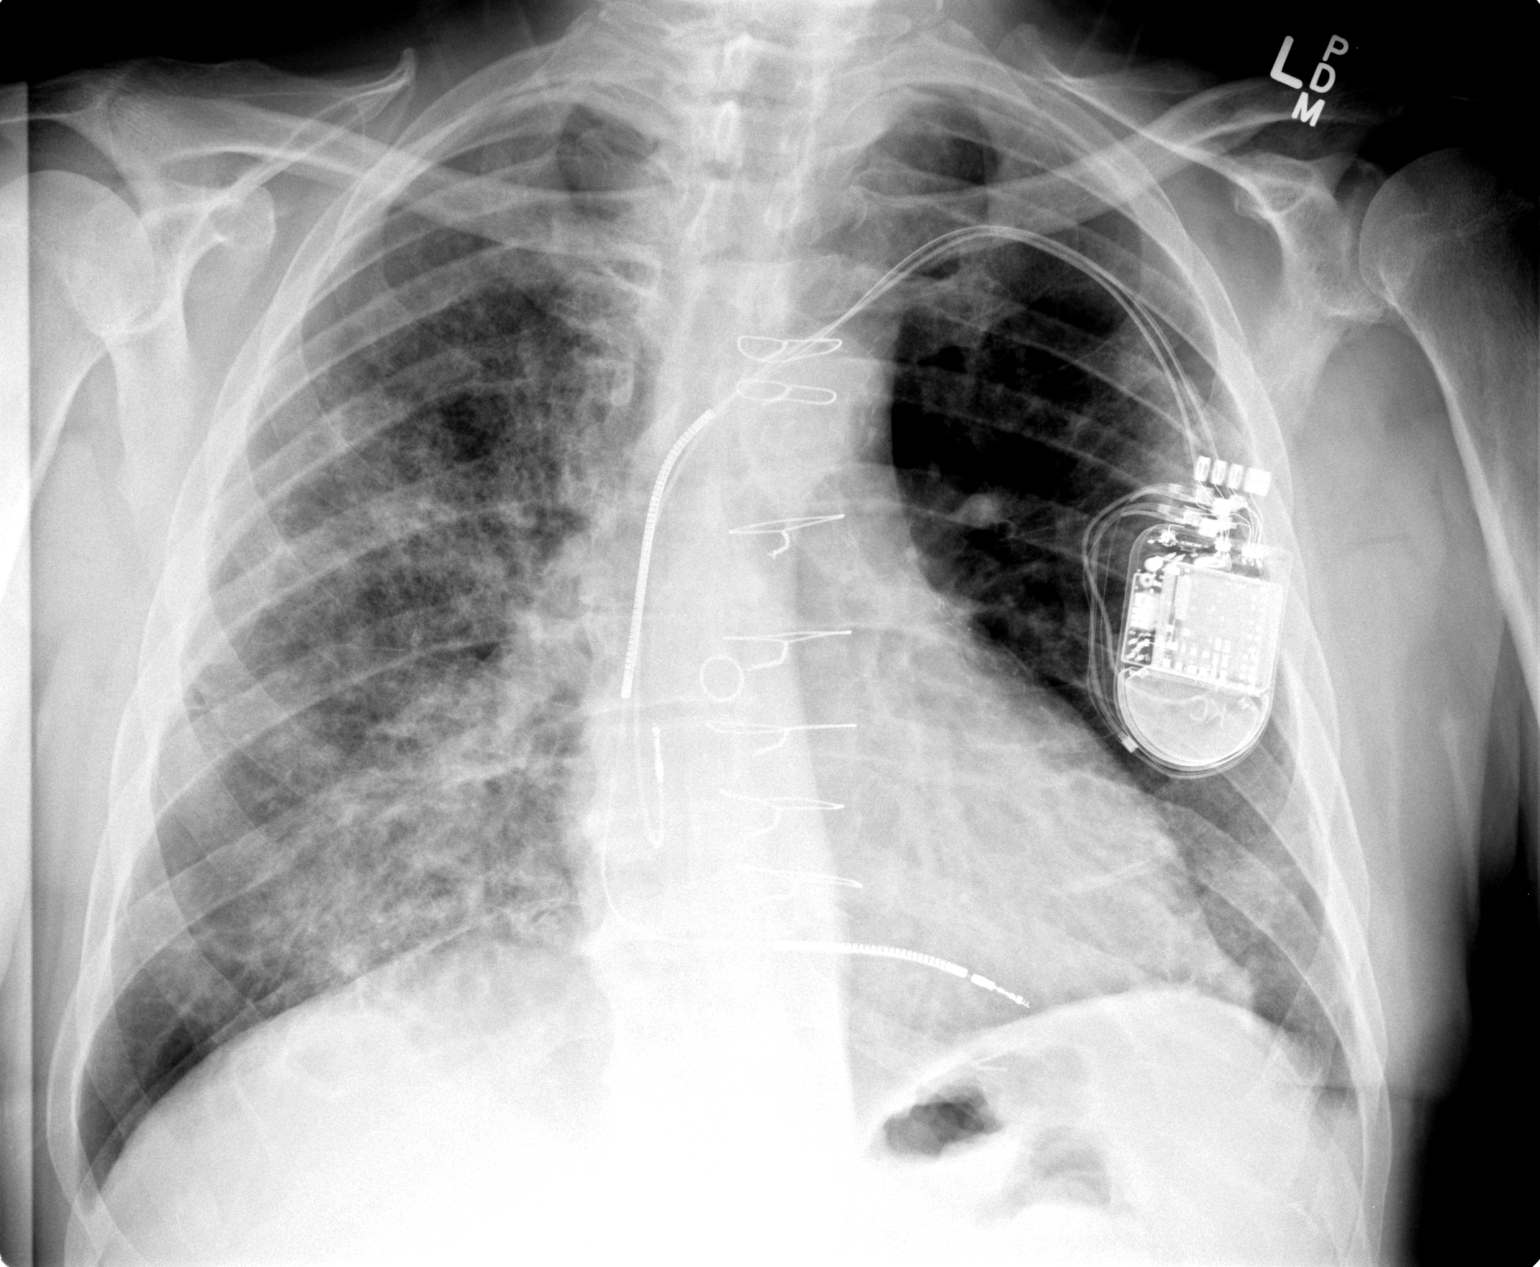

[view not recorded (2 of 2)]
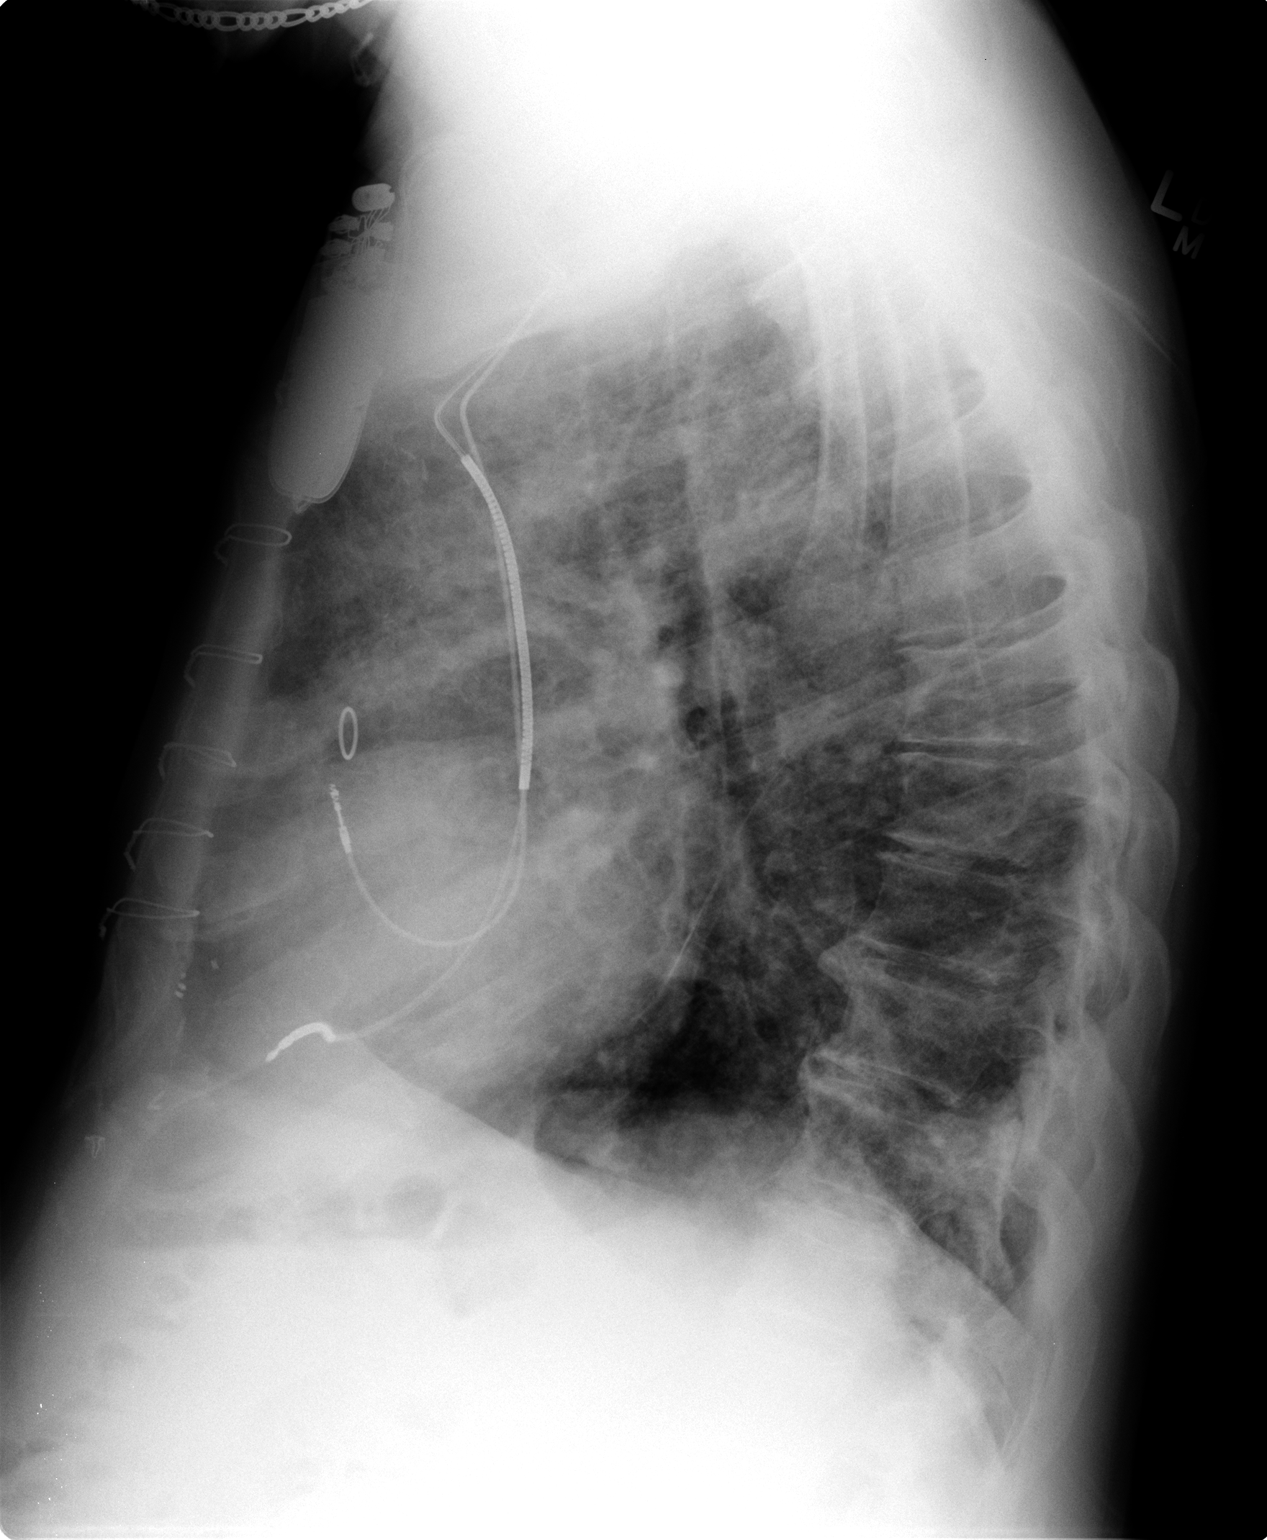

[2 of 2 positions shown; findings below may reference images not displayed]

FINDINGS: Cardiomediastinal silhouette is stable. Dual lead cardiac pacemaker
is unchanged in position. Worsening infiltrate/ pneumonia in right
lower lobe. Follow-up to resolution is recommended. Status post
CABG. Degenerative changes thoracic spine.
IMPRESSION: Worsening infiltrate/ pneumonia in right lower lobe. Follow-up to
resolution is recommended.

## 2015-07-30 ENCOUNTER — Telehealth: Payer: Self-pay | Admitting: Internal Medicine

## 2015-07-30 NOTE — Telephone Encounter (Signed)
Walk in pt form-Physicians Physical Release Form-Dropped off Placed on [employer]-

## 2015-08-07 NOTE — Telephone Encounter (Signed)
Reviewed with Dr. Graciela Husbands -  Attempted to reach patient to inform him that he needs to make an appt to determine clearance for exercise program at [facility]. Will forward to [place], RN to call pt next week

## 2015-08-11 LAB — CUP PACEART REMOTE DEVICE CHECK
Brady Statistic AP VP Percent: 12 %
Brady Statistic AP VS Percent: 88 %
Brady Statistic AS VP Percent: 1 %
Brady Statistic AS VS Percent: 1 %
Date Time Interrogation Session: 20170221070017
HighPow Impedance: 39 Ohm
HighPow Impedance: 40 Ohm
Implantable Lead Implant Date: 20080324
Implantable Lead Location: 753860
Lead Channel Setting Pacing Amplitude: 2.5 V
Lead Channel Setting Pacing Pulse Width: 0.7 ms
Lead Channel Setting Sensing Sensitivity: 0.5 mV
MDC IDC LEAD IMPLANT DT: 20080324
MDC IDC LEAD LOCATION: 753859
MDC IDC LEAD MODEL: 7120
MDC IDC MSMT BATTERY REMAINING LONGEVITY: 71 mo
MDC IDC MSMT BATTERY REMAINING PERCENTAGE: 80 %
MDC IDC MSMT BATTERY VOLTAGE: 2.98 V
MDC IDC MSMT LEADCHNL RA IMPEDANCE VALUE: 390 Ohm
MDC IDC MSMT LEADCHNL RV IMPEDANCE VALUE: 340 Ohm
MDC IDC MSMT LEADCHNL RV SENSING INTR AMPL: 10 mV
MDC IDC SET LEADCHNL RA PACING AMPLITUDE: 2 V
MDC IDC STAT BRADY RA PERCENT PACED: 99 %
MDC IDC STAT BRADY RV PERCENT PACED: 12 %
Pulse Gen Serial Number: 7194807

## 2015-08-12 ENCOUNTER — Encounter: Payer: Self-pay | Admitting: Cardiology

## 2015-08-13 ENCOUNTER — Ambulatory Visit (INDEPENDENT_AMBULATORY_CARE_PROVIDER_SITE_OTHER): Payer: Medicare Other | Admitting: *Deleted

## 2015-08-13 DIAGNOSIS — Z7901 Long term (current) use of anticoagulants: Secondary | ICD-10-CM | POA: Diagnosis not present

## 2015-08-13 DIAGNOSIS — Z5181 Encounter for therapeutic drug level monitoring: Secondary | ICD-10-CM

## 2015-08-13 DIAGNOSIS — I4891 Unspecified atrial fibrillation: Secondary | ICD-10-CM

## 2015-08-13 LAB — POCT INR: INR: 2.2

## 2015-08-13 NOTE — Telephone Encounter (Signed)
I left a message for the patient to call on his "preferred" number regarding the need to schedule follow up for clearance to be completed for [employer].

## 2015-08-17 ENCOUNTER — Other Ambulatory Visit (INDEPENDENT_AMBULATORY_CARE_PROVIDER_SITE_OTHER): Payer: Medicare Other

## 2015-08-17 ENCOUNTER — Ambulatory Visit (INDEPENDENT_AMBULATORY_CARE_PROVIDER_SITE_OTHER): Payer: Medicare Other | Admitting: Internal Medicine

## 2015-08-17 ENCOUNTER — Encounter: Payer: Self-pay | Admitting: Internal Medicine

## 2015-08-17 VITALS — BP 110/68 | HR 70 | Temp 98.3°F | Resp 16 | Ht 70.0 in | Wt 184.0 lb

## 2015-08-17 DIAGNOSIS — E785 Hyperlipidemia, unspecified: Secondary | ICD-10-CM

## 2015-08-17 DIAGNOSIS — I480 Paroxysmal atrial fibrillation: Secondary | ICD-10-CM | POA: Diagnosis not present

## 2015-08-17 DIAGNOSIS — I10 Essential (primary) hypertension: Secondary | ICD-10-CM | POA: Diagnosis not present

## 2015-08-17 DIAGNOSIS — I5042 Chronic combined systolic (congestive) and diastolic (congestive) heart failure: Secondary | ICD-10-CM | POA: Diagnosis not present

## 2015-08-17 DIAGNOSIS — I255 Ischemic cardiomyopathy: Secondary | ICD-10-CM | POA: Diagnosis not present

## 2015-08-17 DIAGNOSIS — Z0000 Encounter for general adult medical examination without abnormal findings: Secondary | ICD-10-CM | POA: Diagnosis not present

## 2015-08-17 DIAGNOSIS — D510 Vitamin B12 deficiency anemia due to intrinsic factor deficiency: Secondary | ICD-10-CM

## 2015-08-17 DIAGNOSIS — Z23 Encounter for immunization: Secondary | ICD-10-CM

## 2015-08-17 LAB — COMPREHENSIVE METABOLIC PANEL
ALT: 21 U/L (ref 0–53)
AST: 21 U/L (ref 0–37)
Albumin: 4.2 g/dL (ref 3.5–5.2)
Alkaline Phosphatase: 83 U/L (ref 39–117)
BILIRUBIN TOTAL: 0.6 mg/dL (ref 0.2–1.2)
BUN: 19 mg/dL (ref 6–23)
CHLORIDE: 99 meq/L (ref 96–112)
CO2: 29 meq/L (ref 19–32)
CREATININE: 1.18 mg/dL (ref 0.40–1.50)
Calcium: 9.5 mg/dL (ref 8.4–10.5)
GFR: 63.18 mL/min (ref >60.00)
GLUCOSE: 105 mg/dL — AB (ref 70–99)
Potassium: 5.4 mEq/L — ABNORMAL HIGH (ref 3.5–5.1)
Sodium: 135 mEq/L (ref 135–145)
Total Protein: 7.2 g/dL (ref 6.0–8.3)

## 2015-08-17 LAB — CBC WITH DIFFERENTIAL/PLATELET
BASOS ABS: 0 10*3/uL (ref 0.0–0.1)
BASOS PCT: 0.4 % (ref 0.0–3.0)
Eosinophils Absolute: 0.1 10*3/uL (ref 0.0–0.7)
Eosinophils Relative: 2.7 % (ref 0.0–5.0)
HCT: 37.2 % — ABNORMAL LOW (ref 39.0–52.0)
Hemoglobin: 12.8 g/dL — ABNORMAL LOW (ref 13.0–17.0)
LYMPHS ABS: 0.6 10*3/uL — AB (ref 0.7–4.0)
Lymphocytes Relative: 12.4 % (ref 12.0–46.0)
MCHC: 34.5 g/dL (ref 30.0–36.0)
MCV: 91 fl (ref 78.0–100.0)
MONOS PCT: 7.7 % (ref 3.0–12.0)
Monocytes Absolute: 0.4 10*3/uL (ref 0.1–1.0)
NEUTROS ABS: 4 10*3/uL (ref 1.4–7.7)
NEUTROS PCT: 76.8 % (ref 43.0–77.0)
PLATELETS: 205 10*3/uL (ref 150.0–400.0)
RBC: 4.08 Mil/uL — ABNORMAL LOW (ref 4.22–5.81)
RDW: 13.4 % (ref 11.5–15.5)
WBC: 5.2 10*3/uL (ref 4.0–10.5)

## 2015-08-17 LAB — LIPID PANEL
CHOL/HDL RATIO: 3
Cholesterol: 169 mg/dL (ref 0–200)
HDL: 56.5 mg/dL (ref >39.00)
LDL Cholesterol: 97 mg/dL (ref 0–99)
NONHDL: 112.93
Triglycerides: 79 mg/dL (ref 0.0–149.0)
VLDL: 15.8 mg/dL (ref 0.0–40.0)

## 2015-08-17 LAB — TSH: TSH: 1.57 u[IU]/mL (ref 0.35–4.50)

## 2015-08-17 NOTE — Assessment & Plan Note (Signed)

## 2015-08-17 NOTE — Progress Notes (Signed)
Subjective:  [name], male    DOB: 12-12-1933  Age: 82 y.o. MRN: [id number]  CC: Hyperlipidemia; Congestive Heart Failure; and Annual Exam   HPI [name] presents for a CPX as well as f/up on hypercholesterolemia and CHF. He has decided to start exercising at a gym with a trainer and needs a letter saying that he has my permission to do so. He has felt well recently with no chest pain, shortness of breath, palpitations, weight gain, edema, or fatigue. He's had no episodes of dyspnea on exertion. His blood pressures been well controlled and he offers no side effects from any of his medications. He is tolerating his statin therapy well with no muscle or joint aches.  Outpatient Prescriptions Prior to Visit  Medication Sig Dispense Refill  . amiodarone (PACERONE) 200 MG tablet TAKE 1 TABLET BY MOUTH DAILY 90 tablet 0  . atorvastatin (LIPITOR) 80 MG tablet Take 1 tablet (80 mg total) by mouth daily. 90 tablet 0  . clobetasol cream (TEMOVATE) 0.05 % Apply 1 application topically daily.     . digoxin (LANOXIN) 0.125 MG tablet Take 1 tablet (0.125 mg total) by mouth every other day. 45 tablet 3  . fish oil-omega-3 fatty acids 1000 MG capsule Take 1 g by mouth 2 (two) times daily.     . metoprolol succinate (TOPROL-XL) 50 MG 24 hr tablet Take 1 tablet (50 mg total) by mouth daily. Take with or immediately following a meal. 90 tablet 3  . Multiple Vitamin (MULTIVITAMIN) tablet Take 1 tablet by mouth daily.     . nitroGLYCERIN (NITROSTAT) 0.4 MG SL tablet Place 0.4 mg under the tongue every 5 (five) minutes as needed for chest pain.     . ramipril (ALTACE) 5 MG capsule TAKE 1 BY MOUTH DAILY 90 capsule 3  . warfarin (COUMADIN) 2 MG tablet TAKE AS DIRECTED BY COUMADIN CLINIC 120 tablet 1  . ramipril (ALTACE) 5 MG capsule Take 1 capsule (5 mg total) by mouth daily. 90 capsule 2   No facility-administered medications prior to visit.    ROS Review of Systems  Constitutional:  Negative.  Negative for activity change, fatigue and unexpected weight change.  HENT: Negative.  Negative for trouble swallowing.   Eyes: Negative.  Negative for visual disturbance.  Respiratory: Negative.  Negative for cough, choking, chest tightness, shortness of breath and stridor.   Cardiovascular: Negative.  Negative for chest pain, palpitations and leg swelling.  Gastrointestinal: Negative.  Negative for nausea, vomiting, abdominal pain, diarrhea, constipation and blood in stool.  Endocrine: Negative.   Genitourinary: Negative.  Negative for difficulty urinating.  Musculoskeletal: Negative.  Negative for myalgias, back pain, joint swelling and arthralgias.  Skin: Negative.  Negative for color change and rash.  Allergic/Immunologic: Negative.   Neurological: Negative.  Negative for dizziness.  Hematological: Negative.  Negative for adenopathy. Does not bruise/bleed easily.  Psychiatric/Behavioral: Negative.     Objective:  BP 110/68 mmHg  Pulse 70  Temp(Src) 98.3 F (36.8 C) (Oral)  Resp 16  Ht 5\' 10"  (1.778 m)  Wt 184 lb (83.462 kg)  BMI 26.40 kg/m2  SpO2 97%  BP Readings from Last 3 Encounters:  08/17/15 110/68  10/15/14 124/70  08/28/14 120/80    Wt Readings from Last 3 Encounters:  08/17/15 184 lb (83.462 kg)  10/15/14 207 lb 3.2 oz (93.985 kg)  08/28/14 216 lb (97.977 kg)    Physical Exam  Constitutional: He is oriented to person, place,  and time. He appears well-developed and well-nourished. No distress.  HENT:  Head: Normocephalic and atraumatic.  Mouth/Throat: Oropharynx is clear and moist. No oropharyngeal exudate.  Eyes: Conjunctivae are normal. Right eye exhibits no discharge. Left eye exhibits no discharge. No scleral icterus.  Neck: Normal range of motion. Neck supple. No JVD present. No tracheal deviation present. No thyromegaly present.  Cardiovascular: Normal rate, regular rhythm, normal heart sounds and intact distal pulses.  Exam reveals no gallop  and no friction rub.   No murmur heard. Pulmonary/Chest: Effort normal and breath sounds normal. No stridor. No respiratory distress. He has no wheezes. He has no rales. He exhibits no tenderness.  Abdominal: Soft. Bowel sounds are normal. He exhibits no distension and no mass. There is no tenderness. There is no rebound and no guarding.  Genitourinary:  Genitourinary and rectal exam was deferred at his request  Musculoskeletal: Normal range of motion. He exhibits no edema or tenderness.  Lymphadenopathy:    He has no cervical adenopathy.  Neurological: He is oriented to person, place, and time.  Skin: Skin is warm and dry. No rash noted. He is not diaphoretic. No erythema. No pallor.  Psychiatric: He has a normal mood and affect. His behavior is normal. Judgment normal.  Vitals reviewed.   Lab Results  Component Value Date   WBC 5.2 08/17/2015   HGB 12.8* 08/17/2015   HCT 37.2* 08/17/2015   PLT 205.0 08/17/2015   GLUCOSE 105* 08/17/2015   CHOL 169 08/17/2015   TRIG 79.0 08/17/2015   HDL 56.50 08/17/2015   LDLCALC 97 08/17/2015   ALT 21 08/17/2015   AST 21 08/17/2015   NA 135 08/17/2015   K 5.4* 08/17/2015   CL 99 08/17/2015   CREATININE 1.18 08/17/2015   BUN 19 08/17/2015   CO2 29 08/17/2015   TSH 1.57 08/17/2015   INR 2.2 08/13/2015    Dg Chest 2 View  06/03/2014  CLINICAL DATA:  Pneumonia. EXAM: CHEST  2 VIEW COMPARISON:  CT 04/08/2014.  Chest x-ray 04/04/2014. FINDINGS: Mediastinum and hilar structures are normal. Minimal residual right base infiltrate. No pleural effusion or pneumothorax. Prior CABG. Stable cardiomegaly. No pulmonary venous congestion. Stable cardiac pacer. IMPRESSION: 1.  Minimal residual right base infiltrate. 2. Prior CABG. Stable cardiomegaly. Cardiac pacer stable. No CHF. Electronically Signed   By: Maisie Fus  Register   On: 06/03/2014 11:21    Assessment & Plan:   Bynum was seen today for hyperlipidemia, congestive heart failure and annual  exam.  Diagnoses and all orders for this visit:  Paroxysmal atrial fibrillation (HCC)- he has good rate and rhythm control, his digoxin level is not toxic and is barely therapeutic, will continue at the current dose -     Digoxin level; Future  Chronic combined systolic and diastolic CHF (congestive heart failure) (HCC)- he has a normal fluid status, will continue the ACE inhibitor, beta blocker, and digoxin. I wrote a letter allowing him to exercise with a trainer. -     Digoxin level; Future  Ischemic cardiomyopathy -     Digoxin level; Future  Essential hypertension- his blood pressures well controlled, electrolytes and renal function are stable. -     Comprehensive metabolic panel; Future -     TSH; Future  Pernicious anemia- His hemoglobin and hematocrit are stable, his vitamin levels have been normal, this appears to be anemia of chronic disease. -     CBC with Differential/Platelet; Future  Hyperlipidemia with target LDL less than 100- he  is achieved his LDL goal is doing well on statin therapy. -     Comprehensive metabolic panel; Future -     Lipid panel; Future -     TSH; Future   I am having Mr. Teuscher maintain his fish oil-omega-3 fatty acids, multivitamin, nitroGLYCERIN, clobetasol cream, digoxin, metoprolol succinate, ramipril, warfarin, amiodarone, and atorvastatin.  No orders of the defined types were placed in this encounter.   See AVS for instructions about healthy living and anticipatory guidance.  Follow-up: Return in about 6 months (around 02/17/2016).  Sanda Linger, MD

## 2015-08-17 NOTE — Progress Notes (Signed)
Pre visit review using our clinic review tool, if applicable. No additional management support is needed unless otherwise documented below in the visit note. 

## 2015-08-17 NOTE — Patient Instructions (Signed)

## 2015-08-18 ENCOUNTER — Encounter: Payer: Self-pay | Admitting: Internal Medicine

## 2015-08-18 LAB — DIGOXIN LEVEL: DIGOXIN LVL: 0.6 ug/L — AB (ref 0.8–2.0)

## 2015-08-21 NOTE — Telephone Encounter (Signed)
The patient has an appointment 6/2 with Dr. Graciela Husbands.

## 2015-09-10 ENCOUNTER — Ambulatory Visit (INDEPENDENT_AMBULATORY_CARE_PROVIDER_SITE_OTHER): Payer: Medicare Other | Admitting: *Deleted

## 2015-09-10 DIAGNOSIS — I480 Paroxysmal atrial fibrillation: Secondary | ICD-10-CM

## 2015-09-10 DIAGNOSIS — Z5181 Encounter for therapeutic drug level monitoring: Secondary | ICD-10-CM | POA: Diagnosis not present

## 2015-09-10 LAB — POCT INR: INR: 2.1

## 2015-09-14 IMAGING — CR DG CHEST 2V
2 series · 2 of 2 positions shown · non-contrast
Comparison: Two-view chest 02/14/2014.

CLINICAL DATA: Community acquired pneumonia.  Pneumonia follow-up.

EXAM:
CHEST  2 VIEW

[view not recorded (1 of 2)]
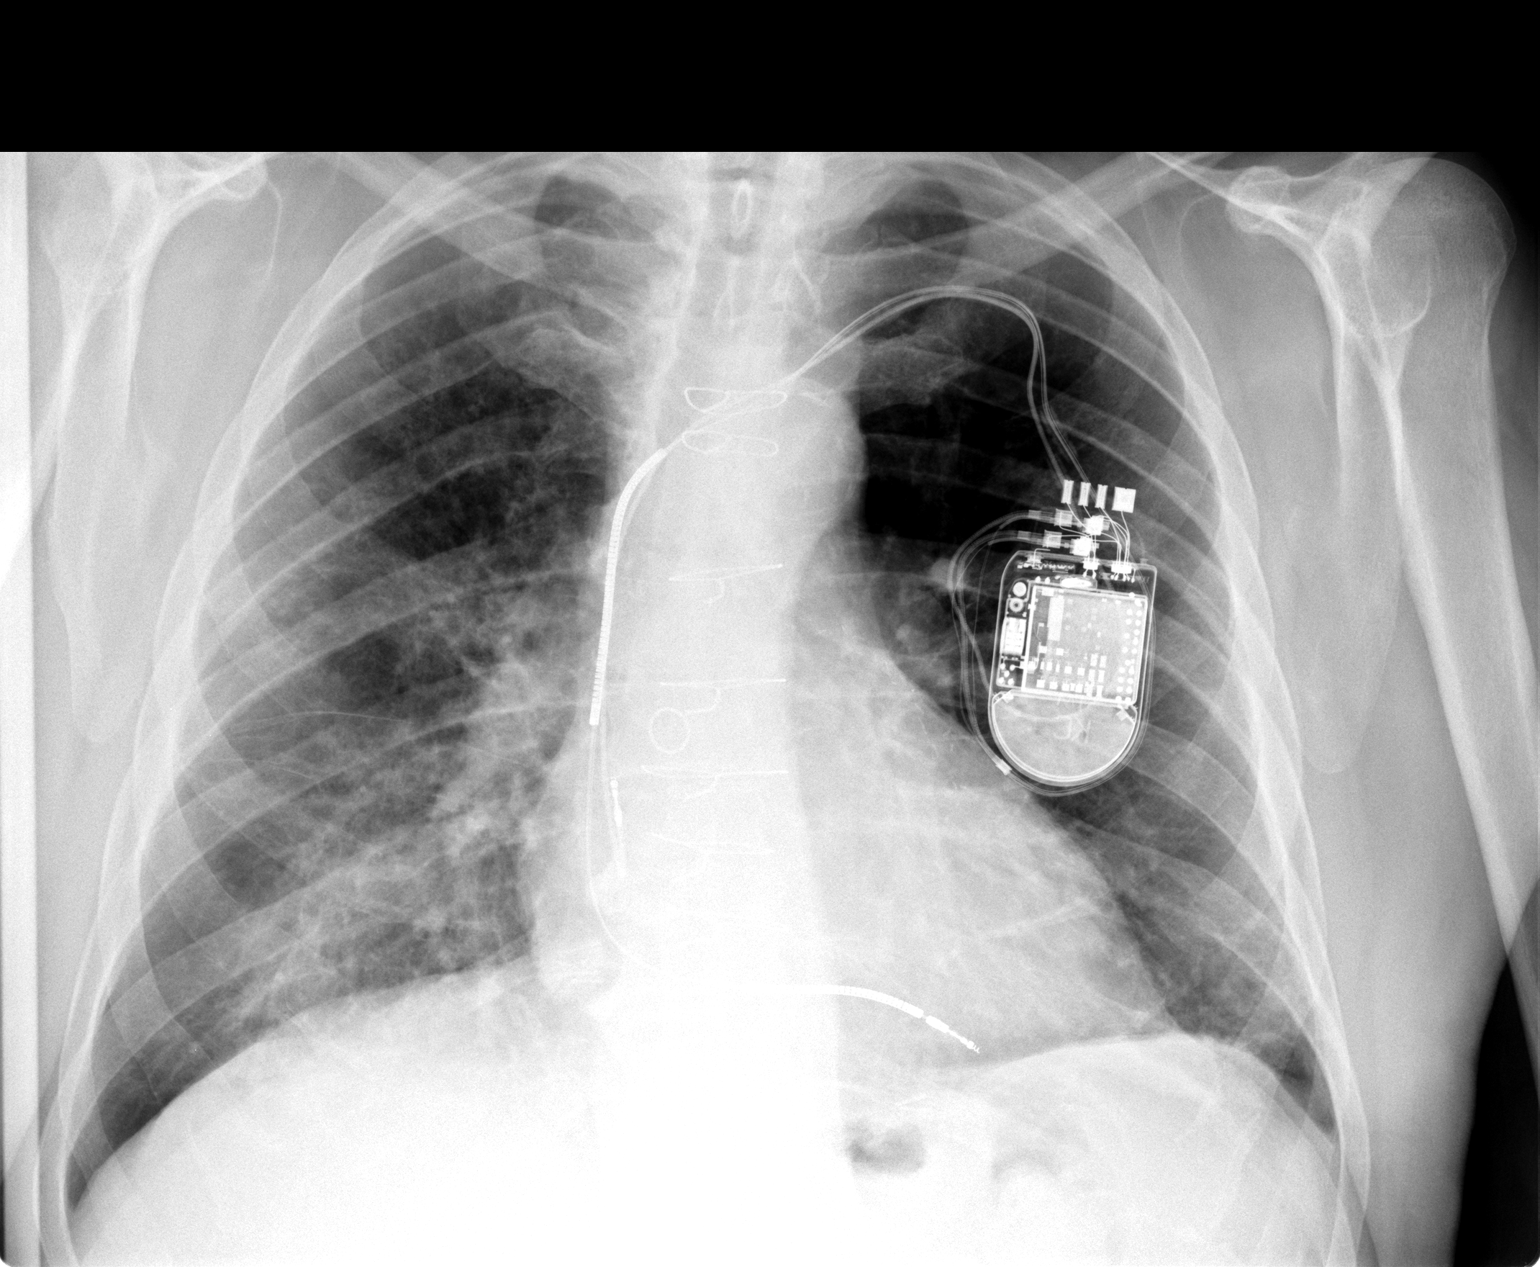

[view not recorded (2 of 2)]
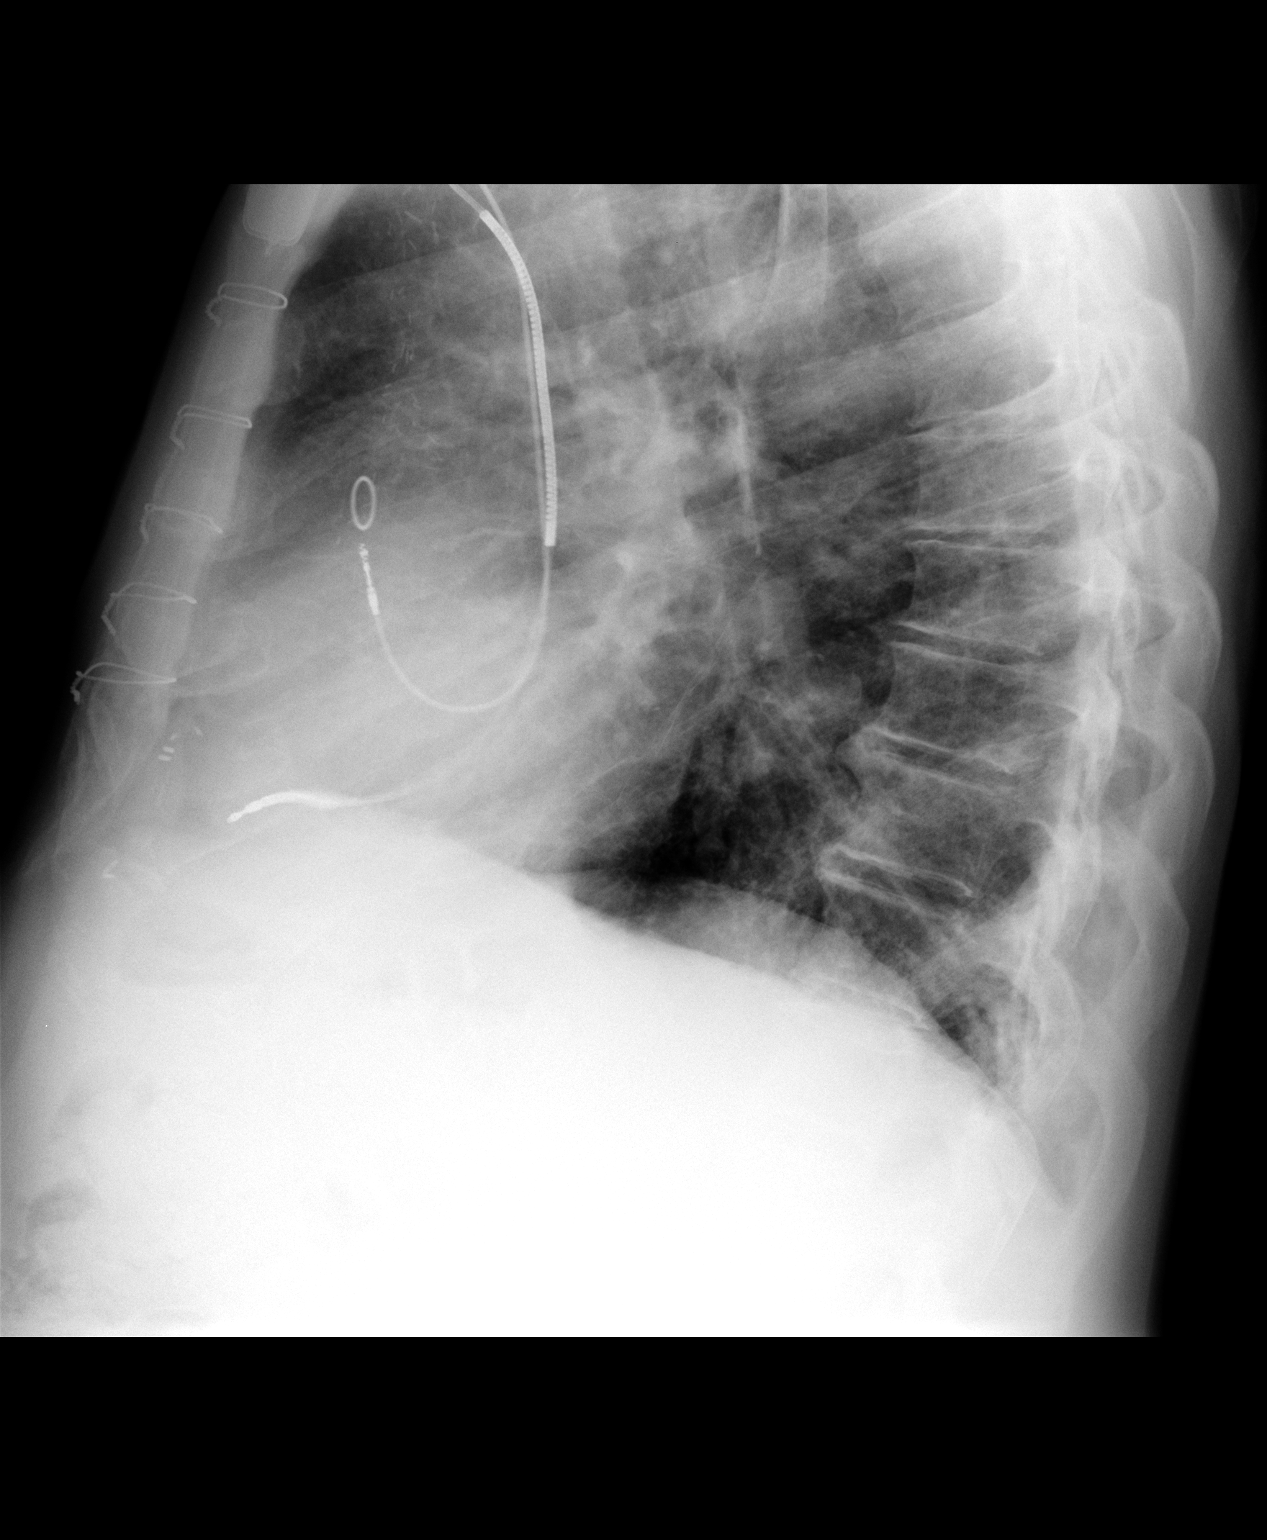

[2 of 2 positions shown; findings below may reference images not displayed]

FINDINGS: The heart size is normal. A dual lead pacemaker/ defibrillator is in
place.

Improved aeration is present in both lungs. Right lower lobe
airspace disease is improved. There is residual airspace disease or
soft tissue density within the medial aspect of the right lower
lobe. The left lung is clear.
IMPRESSION: 1. Improved put some residual right lower lobe airspace disease with
soft tissue opacity medially. Recommend CT of the chest with
contrast for further evaluation.
2. Improved edema and pulmonary congestion in both lungs.
3. Stable  pacemaker/AICD.

## 2015-09-18 IMAGING — CT CT CHEST W/ CM
2 of 4 series · 15 of 36 positions shown, 18 images · IV contrast (Omnipaque 300)
Comparison: No prior chest CT. Multiple prior chest x-rays dating
back to 08/16/2006, most recently 04/04/2014.

CLINICAL DATA: Recent treatment for community acquired pneumonia,
with residual right lower lobe disease on recent imaging. Evaluate
for underlying right lower lobe lung mass.

EXAM:
CT CHEST WITH CONTRAST
TECHNIQUE: Multidetector CT imaging of the chest was performed during
intravenous contrast administration.
CONTRAST:  80mL OMNIPAQUE IOHEXOL 300 MG/ML IV.

[Series 2: chest routine with · axial · 0.70mm/px · z∈[-319,-44]mm · 12 of 65 slices shown, 15 images]
[im 5/65  mediastinal]
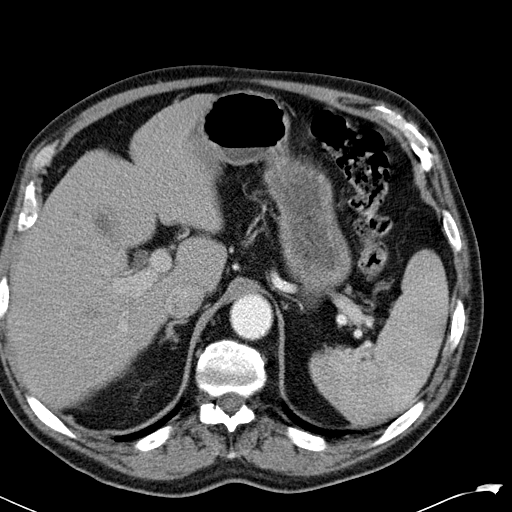
[im 5/65  lung]
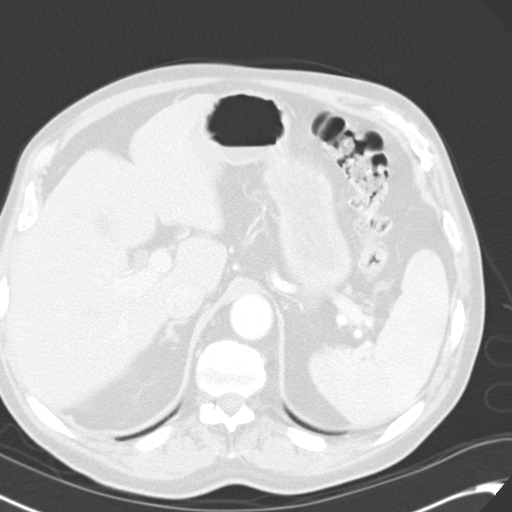
[im 10/65  lung]
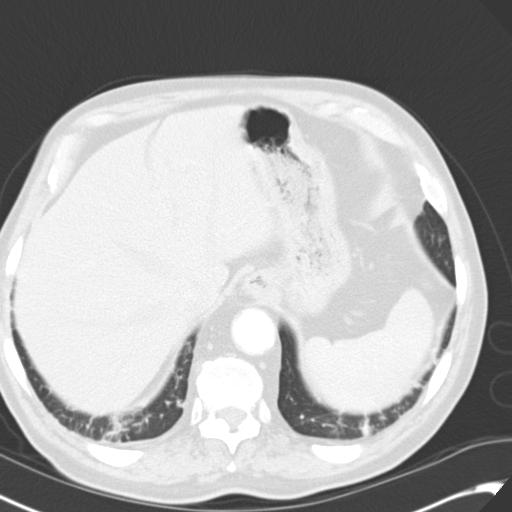
[im 15/65  lung]
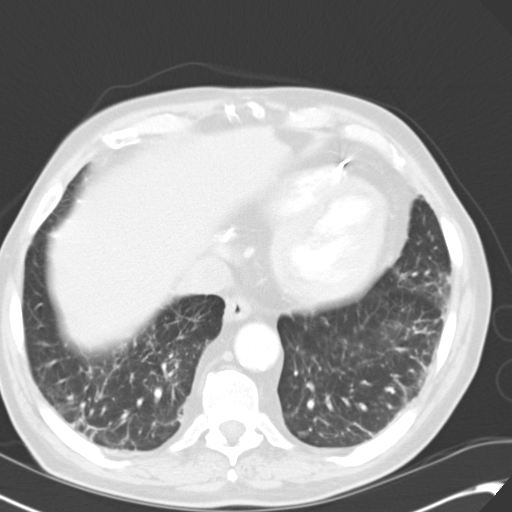
[im 20/65  lung]
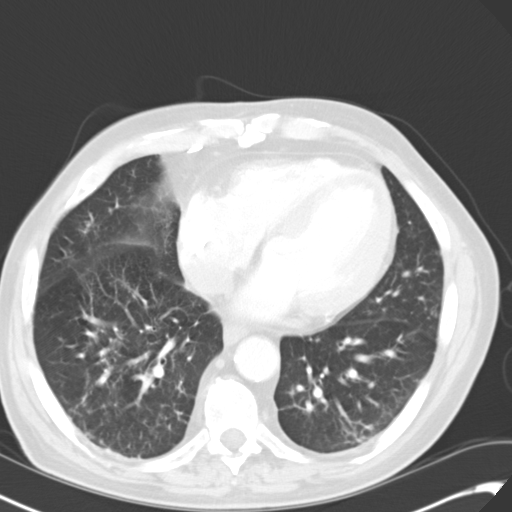
[im 25/65  mediastinal]
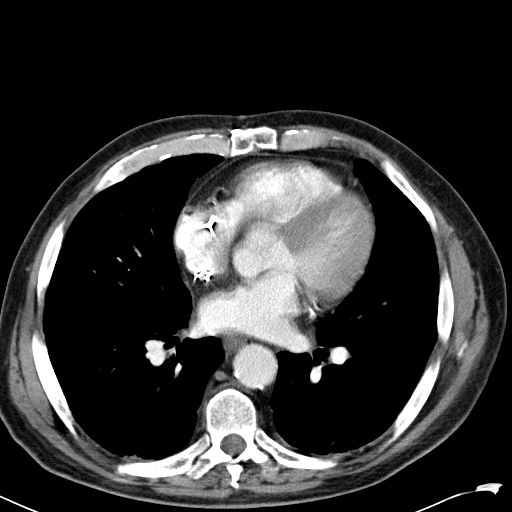
[im 25/65  lung]
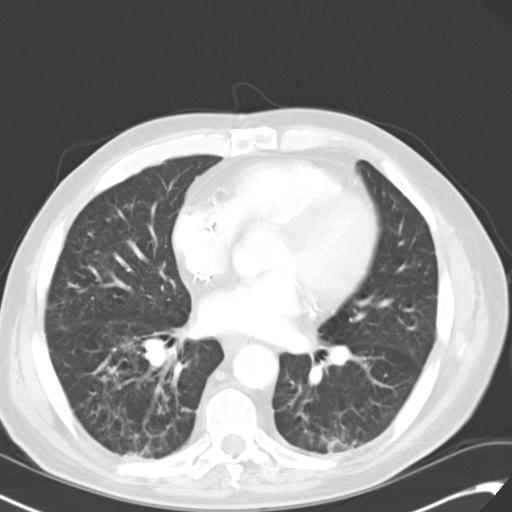
[im 30/65  lung]
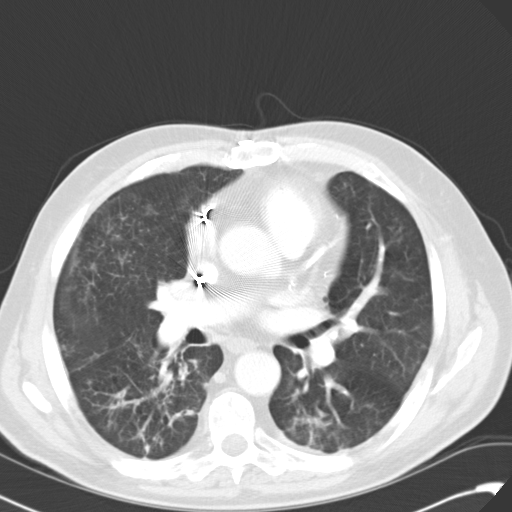
[im 35/65  lung]
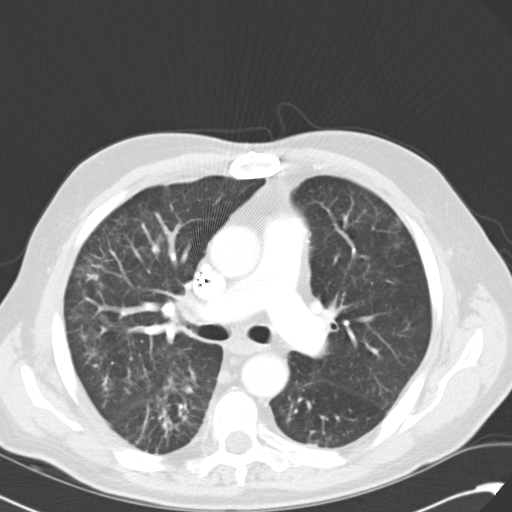
[im 40/65  lung]
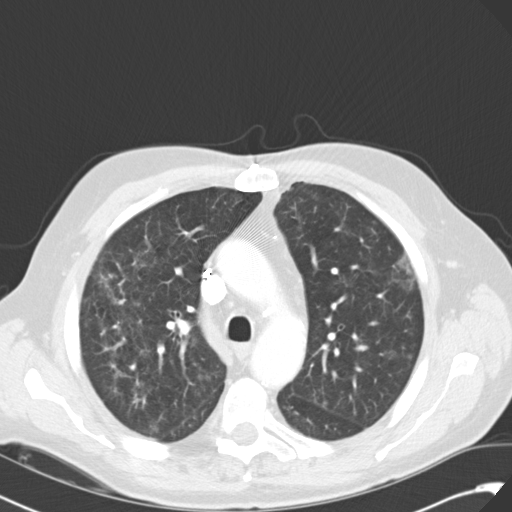
[im 45/65  mediastinal]
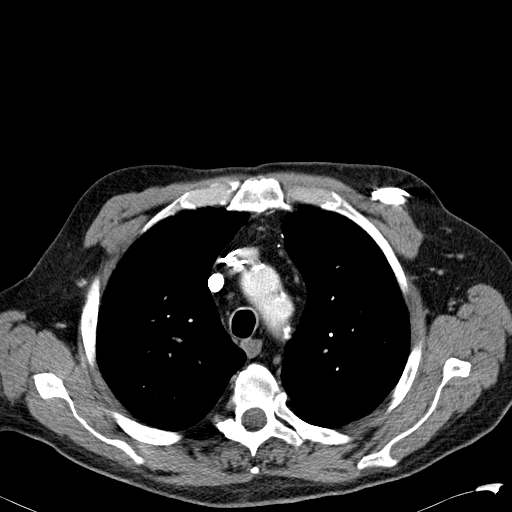
[im 45/65  lung]
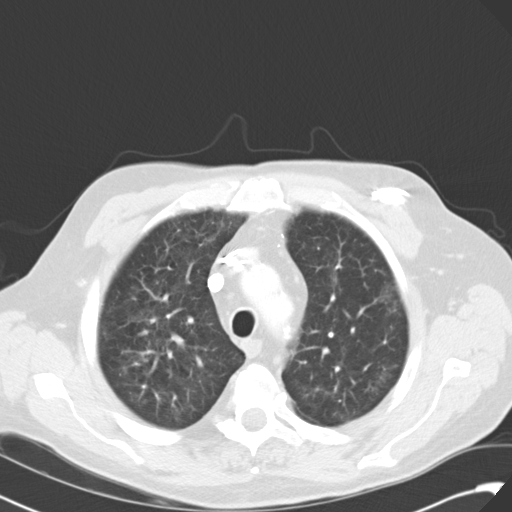
[im 50/65  lung]
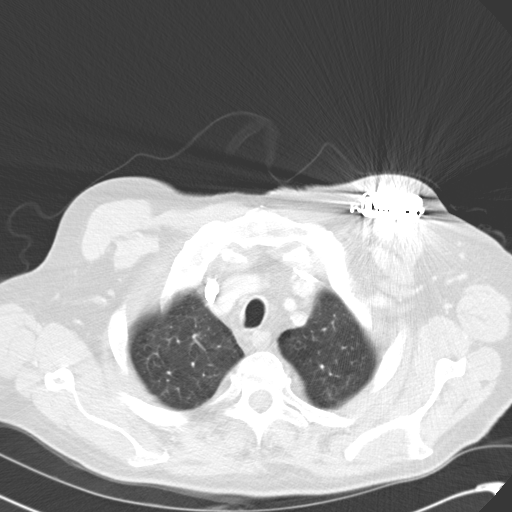
[im 55/65  lung]
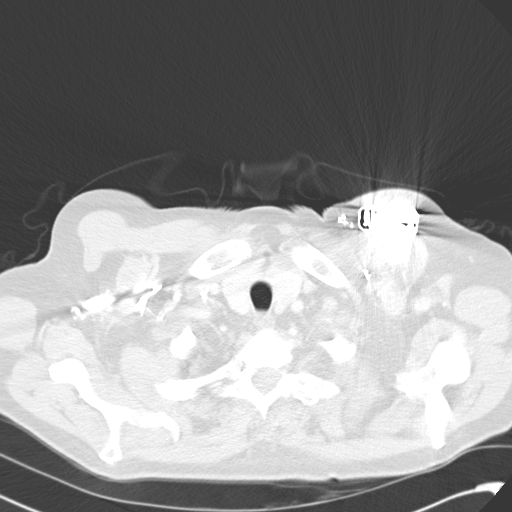
[im 60/65  lung]
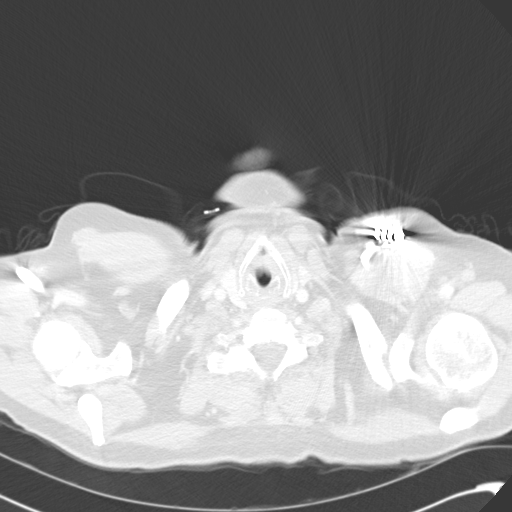

[Series 602: cor · coronal · 0.78mm/px · 3 of 130 slices shown]
[im 26/130  lung]
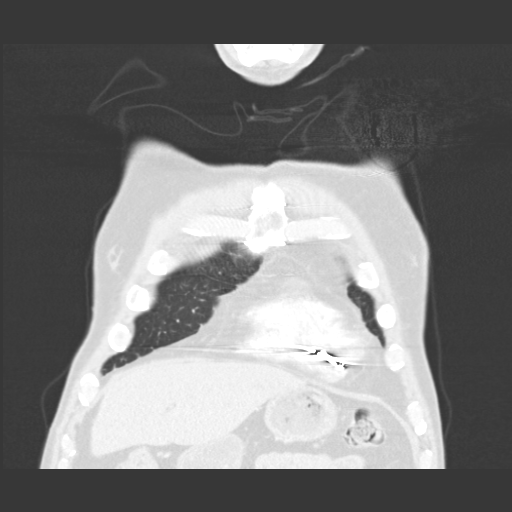
[im 52/130  lung]
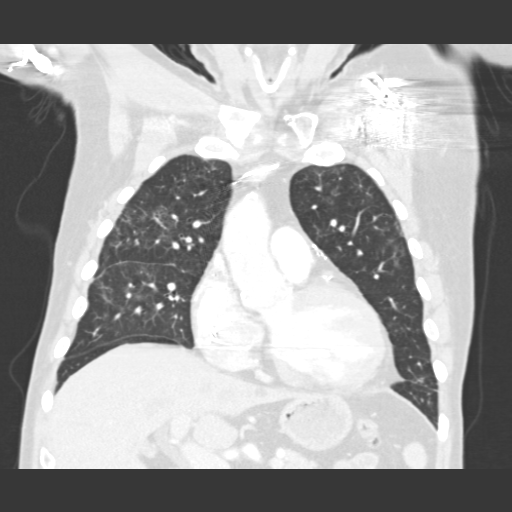
[im 78/130  lung]
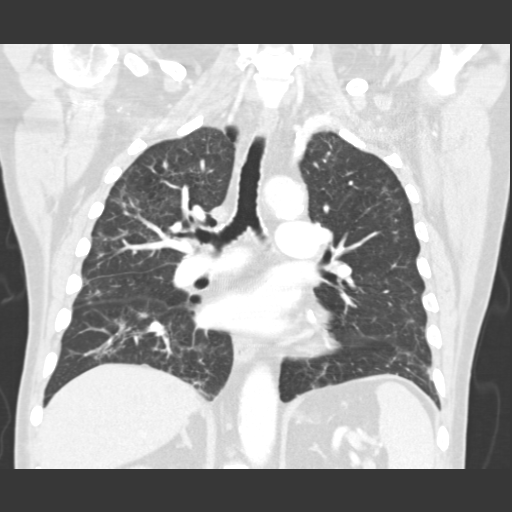

[15 of 36 positions shown; findings below may reference images not displayed]

FINDINGS: No evidence of right lower lobe lung mass as questioned previously.
Peribronchovascular opacities throughout both lungs, with
involvement of every lobe in both lungs, worst in the right lower
lobe. These are associated with micronodules which are in a
perilymphatic distribution. No confluent consolidation. No pulmonary
parenchymal nodules or masses. No pleural effusions. Central airways
patent without significant bronchial wall thickening.

Heart enlarged with left ventricular predominance. Left subclavian
pacing defibrillator with the lead tips at the right atrial
appendage and right ventricular apex. Prior sternotomy for CABG. No
pericardial effusion. Moderate atherosclerosis involving the
thoracic and upper abdominal aorta without evidence of aneurysm or
dissection. Proximal great vessels atherosclerotic though patent.

Borderline to mild thyroid gland enlargement without nodularity. No
significant mediastinal, hilar or axillary lymphadenopathy.

Calcified gallstones within the gallbladder. No CT evidence of acute
cholecystitis. Visualized upper abdomen otherwise unremarkable. Bone
window images demonstrate mid and lower thoracic spondylosis.
IMPRESSION: 1. No evidence of right lower lobe lung mass as questioned recently.
2. Peribronchovascular opacities throughout both lungs, greatest in
the right lower lobe, associated with micronodules in a
perilymphatic distribution. This does not have the typical
appearance of community acquired pneumonia. Sarcoidosis,
amyloidosis, and inhalational disease is such as silicosis are in
the differential diagnosis.
3. Cardiomegaly with left ventricular predominance.
4. Cholelithiasis without evidence of acute cholecystitis.

## 2015-09-30 ENCOUNTER — Other Ambulatory Visit: Payer: Self-pay | Admitting: *Deleted

## 2015-09-30 MED ORDER — AMIODARONE HCL 200 MG PO TABS: ORAL_TABLET | ORAL | Status: DC

## 2015-09-30 MED ORDER — DIGOXIN 125 MCG PO TABS: 0.1250 mg | ORAL_TABLET | ORAL | Status: DC

## 2015-10-01 ENCOUNTER — Other Ambulatory Visit: Payer: Self-pay

## 2015-10-01 ENCOUNTER — Other Ambulatory Visit: Payer: Self-pay | Admitting: *Deleted

## 2015-10-01 MED ORDER — WARFARIN SODIUM 2 MG PO TABS: ORAL_TABLET | ORAL | Status: DC

## 2015-10-02 ENCOUNTER — Other Ambulatory Visit: Payer: Self-pay | Admitting: *Deleted

## 2015-10-02 ENCOUNTER — Other Ambulatory Visit: Payer: Self-pay

## 2015-10-02 DIAGNOSIS — E785 Hyperlipidemia, unspecified: Secondary | ICD-10-CM

## 2015-10-02 MED ORDER — ATORVASTATIN CALCIUM 80 MG PO TABS: 80.0000 mg | ORAL_TABLET | Freq: Every day | ORAL | Status: DC

## 2015-10-02 MED ORDER — RAMIPRIL 5 MG PO CAPS: ORAL_CAPSULE | ORAL | Status: DC

## 2015-10-02 MED ORDER — METOPROLOL SUCCINATE ER 50 MG PO TB24: 50.0000 mg | ORAL_TABLET | Freq: Every day | ORAL | Status: DC

## 2015-10-02 NOTE — Telephone Encounter (Signed)
Receive call pt states he needing refill sent to mail service on the atorvastatin. Verified mail service sen to [facility]...Raechel Chute

## 2015-10-03 ENCOUNTER — Encounter: Payer: Self-pay | Admitting: Internal Medicine

## 2015-10-05 ENCOUNTER — Other Ambulatory Visit: Payer: Self-pay | Admitting: *Deleted

## 2015-10-05 MED ORDER — METOPROLOL SUCCINATE ER 50 MG PO TB24: 50.0000 mg | ORAL_TABLET | Freq: Every day | ORAL | Status: AC

## 2015-10-23 ENCOUNTER — Ambulatory Visit (INDEPENDENT_AMBULATORY_CARE_PROVIDER_SITE_OTHER): Payer: Medicare Other | Admitting: Internal Medicine

## 2015-10-23 ENCOUNTER — Ambulatory Visit (INDEPENDENT_AMBULATORY_CARE_PROVIDER_SITE_OTHER): Payer: Medicare Other | Admitting: *Deleted

## 2015-10-23 ENCOUNTER — Encounter: Payer: Self-pay | Admitting: Internal Medicine

## 2015-10-23 VITALS — BP 104/76 | HR 61 | Ht 70.0 in | Wt 186.1 lb

## 2015-10-23 DIAGNOSIS — I5042 Chronic combined systolic (congestive) and diastolic (congestive) heart failure: Secondary | ICD-10-CM | POA: Diagnosis not present

## 2015-10-23 DIAGNOSIS — I472 Ventricular tachycardia, unspecified: Secondary | ICD-10-CM

## 2015-10-23 DIAGNOSIS — Z95810 Presence of automatic (implantable) cardiac defibrillator: Secondary | ICD-10-CM | POA: Diagnosis not present

## 2015-10-23 DIAGNOSIS — I255 Ischemic cardiomyopathy: Secondary | ICD-10-CM | POA: Diagnosis not present

## 2015-10-23 DIAGNOSIS — Z5181 Encounter for therapeutic drug level monitoring: Secondary | ICD-10-CM | POA: Diagnosis not present

## 2015-10-23 DIAGNOSIS — I480 Paroxysmal atrial fibrillation: Secondary | ICD-10-CM

## 2015-10-23 LAB — CUP PACEART INCLINIC DEVICE CHECK
Brady Statistic RV Percent Paced: 10 %
HIGH POWER IMPEDANCE MEASURED VALUE: 39.8203
Implantable Lead Implant Date: 20080324
Implantable Lead Location: 753860
Lead Channel Impedance Value: 350 Ohm
Lead Channel Pacing Threshold Amplitude: 0.75 V
Lead Channel Pacing Threshold Amplitude: 1.25 V
Lead Channel Pacing Threshold Pulse Width: 0.7 ms
Lead Channel Sensing Intrinsic Amplitude: 11.1 mV
Lead Channel Setting Pacing Amplitude: 2 V
Lead Channel Setting Pacing Amplitude: 2.5 V
Lead Channel Setting Pacing Pulse Width: 0.7 ms
Lead Channel Setting Sensing Sensitivity: 0.5 mV
MDC IDC LEAD IMPLANT DT: 20080324
MDC IDC LEAD LOCATION: 753859
MDC IDC LEAD MODEL: 7120
MDC IDC MSMT BATTERY REMAINING LONGEVITY: 72 mo
MDC IDC MSMT LEADCHNL LV IMPEDANCE VALUE: 3187.5 Ohm
MDC IDC MSMT LEADCHNL RA IMPEDANCE VALUE: 412.5 Ohm
MDC IDC MSMT LEADCHNL RA PACING THRESHOLD AMPLITUDE: 0.75 V
MDC IDC MSMT LEADCHNL RA PACING THRESHOLD PULSEWIDTH: 0.5 ms
MDC IDC MSMT LEADCHNL RA PACING THRESHOLD PULSEWIDTH: 0.5 ms
MDC IDC MSMT LEADCHNL RA SENSING INTR AMPL: 1.3 mV
MDC IDC MSMT LEADCHNL RV PACING THRESHOLD AMPLITUDE: 1.25 V
MDC IDC MSMT LEADCHNL RV PACING THRESHOLD PULSEWIDTH: 0.7 ms
MDC IDC SESS DTM: 20170602200415
MDC IDC STAT BRADY RA PERCENT PACED: 99.16 %
Pulse Gen Serial Number: 7194807

## 2015-10-23 LAB — BASIC METABOLIC PANEL
BUN: 26 mg/dL — AB (ref 7–25)
CHLORIDE: 103 mmol/L (ref 98–110)
CO2: 25 mmol/L (ref 20–31)
Calcium: 8.8 mg/dL (ref 8.6–10.3)
Creat: 1.01 mg/dL (ref 0.70–1.18)
Glucose, Bld: 92 mg/dL (ref 65–99)
POTASSIUM: 4.3 mmol/L (ref 3.5–5.3)
Sodium: 137 mmol/L (ref 135–146)

## 2015-10-23 LAB — POCT INR: INR: 2.2

## 2015-10-23 NOTE — Progress Notes (Signed)
Patient Care Team: Etta Grandchild, MD as PCP - General (Internal Medicine) Duke Salvia, MD as Attending Physician (Cardiology)   HPI  [name] is a 82 y.o. male seen in followup for ventricular tachycardia and atrial fibrillation s/p ICD implantation with both appropriate and inappropriate therapy. He has a history of ischemic heart disease with prior CABG and moderate depression of LV systolic function.   He underwent device generator reimplantation 5/15.  He's had episodes of polymorphic and monomorphic ventricular tachycardi;  tikosyn was discontinued and amiodarone initiated.  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  Ejection fraction was 35-40% echo 2015    Past Medical History  Diagnosis Date  . DVT (deep venous thrombosis) (HCC)   . Ischemic cardiomyopathy     CABG vx4 1986 and PTCA in 1991; stent 1997 cath 2008  . Dyslipidemia   . HTN (hypertension)   . Atrial fibrillation (HCC)   . Ventricular tachycardia (HCC)     With prior shock therapy and antitachycardia pacing  . Automatic implantable cardiac defibrillator St Judes 3/08    St. Jude  . History of colonoscopy 10/14/2002  . Coronary artery disease   . Fatigue 03/29/2012  . Arthritis     Past Surgical History  Procedure Laterality Date  . Cardiac defibrillator placement  3/08    St. Jude  . Ptca  1997  . Coronary artery bypass graft  1996    last cath reports an atretic LIMA to the LAD and patent SVG to RCA  . Implantable cardioverter defibrillator generator change N/A 10/07/2013    Procedure: IMPLANTABLE CARDIOVERTER DEFIBRILLATOR GENERATOR CHANGE;  Surgeon: Duke Salvia, MD;  Location: [facility] CATH LAB;  Service: Cardiovascular;  Laterality: N/A;    Current  Outpatient Prescriptions  Medication Sig Dispense Refill  . amiodarone (PACERONE) 200 MG tablet TAKE 1 TABLET BY MOUTH DAILY 90 tablet 0  . atorvastatin (LIPITOR) 80 MG tablet Take 1 tablet (80 mg total) by mouth daily. 90 tablet 3  . clobetasol cream (TEMOVATE) 0.05 % Apply 1 application topically daily.     . digoxin (LANOXIN) 0.125 MG tablet Take 1 tablet (0.125 mg total) by mouth every other day. 45 tablet 0  . fish oil-omega-3 fatty acids 1000 MG capsule Take 1 g by mouth 2 (two) times daily.     . metoprolol succinate (TOPROL-XL) 50 MG 24 hr tablet Take 1 tablet (50 mg total) by mouth daily. Take with or immediately following a meal. 90 tablet 3  . Multiple Vitamin (MULTIVITAMIN) tablet Take 1 tablet by mouth daily.     . nitroGLYCERIN (NITROSTAT) 0.4 MG SL tablet Place 0.4 mg under the tongue every 5 (five) minutes as needed for chest pain.     . ramipril (ALTACE) 5 MG capsule TAKE 1 BY MOUTH DAILY 90 capsule 0  . warfarin (COUMADIN) 2 MG tablet TAKE AS DIRECTED BY COUMADIN CLINIC 120 tablet 1   No current facility-administered medications for this visit.    No Known Allergies  Review of Systems negative except from HPI and PMH  Physical Exam BP 104/76 mmHg  Pulse 61  Ht  (1.778 m)  Wt 186 lb 1.9 oz (84.423 kg)  BMI 26.71 kg/m2 Well developed and nourished in no acute  distress HENT normal Neck supple with JVP-flat Clear Device pocket well healed; without hematoma or erythema.  There is no tethering Regular rate and rhythm heart sounds are faint and there is a 2/6 systolic murmur  Abd-soft with active BS No Clubbing cyanosis edema Skin-warm and dry A & Oriented  Grossly normal sensory and motor function  ECG A pacing with intervals 34/19/44   Assessment and  Plan  Ventricular tachycardia-polymorphic/monomorphic  Ischemic cardiomyopathy with prior bypass; ejection fraction 35-40% 5/15  Implantable defibrillator  St Jude  The patient's device was interrogated  and the information was fully reviewed.  The device was reprogrammed to minimize drain on the battery. His ventricular pacing chamber.  Fatigue  Sinus node dysfunction  Left bundle branch block  First-degree AV block  His complaints on neuropathy may be related to amiodarone; may also be related to his statin therapy.    I wonder if any other drugs may be contributing.For now, we will decrease his amiodarone from 200--100 as most neurotoxicity with amiodarone is thought to be dose related  In addition we will undertake a six-week exclusion trial of his statin therapy.

## 2015-10-23 NOTE — Patient Instructions (Addendum)
Medication Instructions: 1) Decrease amiodarone to 200 mg 1/2 tablet (100 mg) by mouth once daily 2) Hold lipitor (atorvastatin) to see if this helps with your symptoms.  Labwork: - Your physician recommends that you have lab work today: [employer]  Procedures/Testing: -   Follow-Up: - Remote monitoring is used to monitor your Pacemaker of ICD from home. This monitoring reduces the number of office visits required to check your device to one time per year. It allows [place] to keep an eye on the functioning of your device to ensure it is working properly. You are scheduled for a device check from home on 01/26/16. You may send your transmission at any time that day. If you have a wireless device, the transmission will be sent automatically. After your physician reviews your transmission, you will receive a postcard with your next transmission date.  - Your physician wants you to follow-up in: 6 months with Gypsy Balsam, NP for Dr. Graciela Husbands.. You will receive a reminder letter in the mail two months in advance. If you don't receive a letter, please call our office to schedule the follow-up appointment.  Any Additional Special Instructions Will Be Listed Below (If Applicable).     If you need a refill on your cardiac medications before your next appointment, please call your pharmacy.

## 2015-11-18 ENCOUNTER — Other Ambulatory Visit: Payer: Self-pay | Admitting: Internal Medicine

## 2015-11-20 ENCOUNTER — Other Ambulatory Visit: Payer: Self-pay | Admitting: Internal Medicine

## 2015-11-25 ENCOUNTER — Ambulatory Visit (INDEPENDENT_AMBULATORY_CARE_PROVIDER_SITE_OTHER): Payer: Medicare Other | Admitting: Pharmacist

## 2015-11-25 DIAGNOSIS — Z5181 Encounter for therapeutic drug level monitoring: Secondary | ICD-10-CM | POA: Diagnosis not present

## 2015-11-25 DIAGNOSIS — Z7901 Long term (current) use of anticoagulants: Secondary | ICD-10-CM | POA: Diagnosis not present

## 2015-11-25 DIAGNOSIS — I4891 Unspecified atrial fibrillation: Secondary | ICD-10-CM | POA: Diagnosis not present

## 2015-11-25 DIAGNOSIS — I480 Paroxysmal atrial fibrillation: Secondary | ICD-10-CM

## 2015-11-25 LAB — POCT INR: INR: 1.2

## 2015-12-16 ENCOUNTER — Ambulatory Visit (INDEPENDENT_AMBULATORY_CARE_PROVIDER_SITE_OTHER): Payer: Medicare Other | Admitting: *Deleted

## 2015-12-16 DIAGNOSIS — I4891 Unspecified atrial fibrillation: Secondary | ICD-10-CM | POA: Diagnosis not present

## 2015-12-16 DIAGNOSIS — Z7901 Long term (current) use of anticoagulants: Secondary | ICD-10-CM | POA: Diagnosis not present

## 2015-12-16 DIAGNOSIS — Z5181 Encounter for therapeutic drug level monitoring: Secondary | ICD-10-CM | POA: Diagnosis not present

## 2015-12-16 LAB — POCT INR: INR: 2

## 2016-01-13 ENCOUNTER — Ambulatory Visit (INDEPENDENT_AMBULATORY_CARE_PROVIDER_SITE_OTHER): Payer: Medicare Other | Admitting: Pharmacist

## 2016-01-13 DIAGNOSIS — Z5181 Encounter for therapeutic drug level monitoring: Secondary | ICD-10-CM

## 2016-01-13 LAB — POCT INR: INR: 1.3

## 2016-01-26 ENCOUNTER — Ambulatory Visit (INDEPENDENT_AMBULATORY_CARE_PROVIDER_SITE_OTHER): Payer: Medicare Other | Admitting: *Deleted

## 2016-01-26 DIAGNOSIS — I255 Ischemic cardiomyopathy: Secondary | ICD-10-CM

## 2016-01-26 NOTE — Progress Notes (Signed)
Remote ICD transmission.   

## 2016-01-29 ENCOUNTER — Encounter: Payer: Self-pay | Admitting: Cardiology

## 2016-02-02 LAB — CUP PACEART REMOTE DEVICE CHECK
Battery Remaining Percentage: 75 %
Brady Statistic AP VP Percent: 1.9 %
Brady Statistic AP VS Percent: 95 %
Brady Statistic AS VP Percent: 1 %
Brady Statistic AS VS Percent: 1 %
Brady Statistic RA Percent Paced: 92 %
Brady Statistic RV Percent Paced: 2.1 %
HighPow Impedance: 37 Ohm
HighPow Impedance: 38 Ohm
Implantable Lead Implant Date: 20080324
Implantable Lead Location: 753859
Implantable Lead Model: 7120
Lead Channel Impedance Value: 330 Ohm
Lead Channel Impedance Value: 380 Ohm
Lead Channel Pacing Threshold Amplitude: 0.75 V
Lead Channel Pacing Threshold Amplitude: 1.25 V
Lead Channel Pacing Threshold Pulse Width: 0.7 ms
Lead Channel Sensing Intrinsic Amplitude: 2 mV
Lead Channel Setting Pacing Amplitude: 2.5 V
MDC IDC LEAD IMPLANT DT: 20080324
MDC IDC LEAD LOCATION: 753860
MDC IDC MSMT BATTERY REMAINING LONGEVITY: 67 mo
MDC IDC MSMT BATTERY VOLTAGE: 2.96 V
MDC IDC MSMT LEADCHNL RA PACING THRESHOLD PULSEWIDTH: 0.5 ms
MDC IDC MSMT LEADCHNL RV SENSING INTR AMPL: 9.3 mV
MDC IDC PG SERIAL: 7194807
MDC IDC SESS DTM: 20170905060017
MDC IDC SET LEADCHNL RA PACING AMPLITUDE: 2 V
MDC IDC SET LEADCHNL RV PACING PULSEWIDTH: 0.7 ms
MDC IDC SET LEADCHNL RV SENSING SENSITIVITY: 0.5 mV

## 2016-02-03 ENCOUNTER — Ambulatory Visit (INDEPENDENT_AMBULATORY_CARE_PROVIDER_SITE_OTHER): Payer: Medicare Other | Admitting: *Deleted

## 2016-02-03 DIAGNOSIS — Z5181 Encounter for therapeutic drug level monitoring: Secondary | ICD-10-CM

## 2016-02-03 LAB — POCT INR: INR: 1.5

## 2016-02-12 ENCOUNTER — Encounter: Payer: Self-pay | Admitting: Cardiology

## 2016-02-24 ENCOUNTER — Telehealth: Payer: Self-pay | Admitting: Cardiology

## 2016-02-24 ENCOUNTER — Ambulatory Visit (INDEPENDENT_AMBULATORY_CARE_PROVIDER_SITE_OTHER): Payer: Medicare Other | Admitting: *Deleted

## 2016-02-24 DIAGNOSIS — Z23 Encounter for immunization: Secondary | ICD-10-CM | POA: Diagnosis not present

## 2016-02-24 DIAGNOSIS — Z5181 Encounter for therapeutic drug level monitoring: Secondary | ICD-10-CM | POA: Diagnosis not present

## 2016-02-24 LAB — POCT INR: INR: 2

## 2016-02-24 NOTE — Telephone Encounter (Signed)
Pt called and stated that he is moving to [place] in November 2017. He wants to be seen by MD before leaving to make sure his ICD function is normal before he leaves and also to get recommendations for a new cardiologist and EP in [place]. He canceled his remote transmission for 12.5-17. Informed him that I would have MD nurse return his call. Pt verbalized understanding.

## 2016-02-24 NOTE — Telephone Encounter (Signed)
I called and spoke with the patient. He is moving to [place] at the beginning of November. He is requesting a follow up with Dr. Graciela Husbands prior to moving. I have scheduled him on 03/07/16 at 12:15 pm. He is agreeable.

## 2016-03-07 ENCOUNTER — Encounter: Payer: Self-pay | Admitting: Internal Medicine

## 2016-03-07 ENCOUNTER — Ambulatory Visit (INDEPENDENT_AMBULATORY_CARE_PROVIDER_SITE_OTHER): Payer: Medicare Other | Admitting: Internal Medicine

## 2016-03-07 VITALS — BP 142/62 | HR 85 | Ht 70.0 in | Wt 194.8 lb

## 2016-03-07 DIAGNOSIS — I255 Ischemic cardiomyopathy: Secondary | ICD-10-CM | POA: Diagnosis not present

## 2016-03-07 DIAGNOSIS — I495 Sick sinus syndrome: Secondary | ICD-10-CM | POA: Diagnosis not present

## 2016-03-07 DIAGNOSIS — I472 Ventricular tachycardia, unspecified: Secondary | ICD-10-CM

## 2016-03-07 DIAGNOSIS — I480 Paroxysmal atrial fibrillation: Secondary | ICD-10-CM

## 2016-03-07 DIAGNOSIS — I428 Other cardiomyopathies: Secondary | ICD-10-CM | POA: Diagnosis not present

## 2016-03-07 DIAGNOSIS — Z95810 Presence of automatic (implantable) cardiac defibrillator: Secondary | ICD-10-CM

## 2016-03-07 LAB — TSH: TSH: 1.27 mIU/L (ref 0.40–4.50)

## 2016-03-07 NOTE — Patient Instructions (Signed)
Medication Instructions: - Your physician recommends that you continue on your current medications as directed. Please refer to the Current Medication list given to you today.  Labwork: - Your physician recommends that you have lab work today: liver/tsh/ digoxin  Procedures/Testing: - none ordered  Follow-Up: - Have a safe move to [place]!!  Any Additional Special Instructions Will Be Listed Below (If Applicable).     If you need a refill on your cardiac medications before your next appointment, please call your pharmacy.

## 2016-03-07 NOTE — Progress Notes (Signed)
Patient Care Team: Etta Grandchild, MD as PCP - General (Internal Medicine) Duke Salvia, MD as Attending Physician (Cardiology)   HPI  [name] is a 82 y.o. male seen in followup for ventricular tachycardia and atrial fibrillation s/p ICD implantation with both appropriate and inappropriate therapy. He has a history of ischemic heart disease with prior CABG and moderate depression of LV systolic function.   He underwent device generator reimplantation 5/15. At that time EF 35-40 %  He's had episodes of polymorphic and monomorphic ventricular tachycardia;  tikosyn was discontinued and amiodarone initiated.  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  Ejection fraction was 35-40% echo 2015   He has no complaints of chest pain or shortness of breath.  He is moving back to [place]. 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 Arthritis   . Atrial fibrillation (HCC)   . Automatic implantable cardiac defibrillator St Judes 3/08   St. Jude  . Coronary artery disease   . DVT (deep venous thrombosis) (HCC)   . Dyslipidemia   . Fatigue 03/29/2012  . History of colonoscopy 10/14/2002  . HTN (hypertension)   . Ischemic cardiomyopathy    CABG vx4 1986 and PTCA in 1991; stent 1997 cath 2008  . Ventricular tachycardia (HCC)    With prior shock therapy and antitachycardia pacing    Past Surgical History:  Procedure Laterality Date  . CARDIAC DEFIBRILLATOR PLACEMENT  3/08   St. Jude  . CORONARY ARTERY BYPASS GRAFT  1996   last cath reports an atretic LIMA to the LAD and patent SVG to RCA  . IMPLANTABLE CARDIOVERTER DEFIBRILLATOR GENERATOR CHANGE N/A 10/07/2013   Procedure: IMPLANTABLE CARDIOVERTER DEFIBRILLATOR GENERATOR CHANGE;  Surgeon: Duke Salvia, MD;  Location: [facility] CATH LAB;  Service: Cardiovascular;  Laterality: N/A;  . PTCA  1997    Current Outpatient Prescriptions  Medication Sig Dispense Refill  . amiodarone (PACERONE) 200 MG tablet Take 0.5 tablets (100 mg total) by mouth daily. 45 tablet 3  . atorvastatin (LIPITOR) 80 MG tablet Take 1 tablet (80 mg total) by mouth daily. 90 tablet 3  . clobetasol cream (TEMOVATE) 0.05 % Apply 1 application topically daily.     Marland Kitchen DIGOX 125 MCG tablet Take 1 tablet by mouth  every other day 45 tablet 3  . fish oil-omega-3 fatty acids 1000 MG capsule Take 1 g by mouth 2 (two) times daily.     . metoprolol succinate (TOPROL-XL) 50 MG 24 hr tablet Take 1 tablet (50 mg total) by mouth daily. Take with or immediately following a meal. 90 tablet 3  . Multiple Vitamin (MULTIVITAMIN) tablet Take 1 tablet by mouth daily.     . nitroGLYCERIN (NITROSTAT) 0.4 MG SL tablet Place 0.4 mg under the tongue every 5 (five) minutes as needed for chest pain.     . ramipril (ALTACE) 5 MG capsule Take 1 capsule by mouth  daily 90 capsule 3  . warfarin (COUMADIN) 2 MG tablet TAKE AS DIRECTED BY COUMADIN CLINIC 120 tablet 1   No current facility-administered medications for this visit.     No Known Allergies  Review of  Systems negative except from HPI and PMH  Physical Exam BP (!) 142/62   Pulse 85   Ht 5\' 10"  (1.778 m)   Wt 194 lb 12.8 oz (88.4 kg)   SpO2 94%   BMI 27.95 kg/m  Well developed and nourished in no acute distress HENT normal Neck supple with JVP-flat Clear Device pocket well healed; without hematoma or erythema.  There is no tethering Regular rate and rhythm heart sounds are faint and there is a 2/6 systolic murmur  Abd-soft with active BS No Clubbing cyanosis edema Skin-warm and dry A & Oriented  Grossly normal sensory and motor function  ECG A pacing with intervals 34/19/44   Assessment and   Plan  Ventricular tachycardia-polymorphic/monomorphic  Ischemic cardiomyopathy with prior bypass; ejection fraction 35-40% 5/15  Implantable defibrillator  St Jude  The patient's device was interrogated and the information was fully reviewed.  The device was reprogrammed to minimize drain on the battery. His ventricular pacing chamber.  Sinus node dysfunction  Atrial fbrillation  Left bundle branch block  First-degree AV block  No intercurrent Ventricular tachycardia  Some Afib; we will recheck amiodarone surveillance laboratories  On Anticoagulation;  No bleeding issues   Without symptoms of ischemia  Left bundle branch block in the setting of his ejection fraction has never necessitated resynchronization.

## 2016-03-08 LAB — HEPATIC FUNCTION PANEL
ALK PHOS: 61 U/L (ref 40–115)
ALT: 12 U/L (ref 9–46)
AST: 21 U/L (ref 10–35)
Albumin: 4.2 g/dL (ref 3.6–5.1)
BILIRUBIN DIRECT: 0.1 mg/dL (ref <=0.2)
BILIRUBIN INDIRECT: 0.3 mg/dL (ref 0.2–1.2)
BILIRUBIN TOTAL: 0.4 mg/dL (ref 0.2–1.2)
TOTAL PROTEIN: 6.9 g/dL (ref 6.1–8.1)

## 2016-03-08 LAB — DIGOXIN LEVEL: Digoxin Level: 0.6 ug/L — ABNORMAL LOW (ref 0.8–2.0)

## 2016-03-13 ENCOUNTER — Other Ambulatory Visit: Payer: Self-pay | Admitting: Internal Medicine

## 2016-03-16 ENCOUNTER — Ambulatory Visit (INDEPENDENT_AMBULATORY_CARE_PROVIDER_SITE_OTHER): Payer: Medicare Other | Admitting: *Deleted

## 2016-03-16 DIAGNOSIS — I4891 Unspecified atrial fibrillation: Secondary | ICD-10-CM

## 2016-03-16 DIAGNOSIS — Z5181 Encounter for therapeutic drug level monitoring: Secondary | ICD-10-CM | POA: Diagnosis not present

## 2016-03-16 DIAGNOSIS — Z7901 Long term (current) use of anticoagulants: Secondary | ICD-10-CM

## 2016-03-16 LAB — POCT INR: INR: 2.4

## 2016-03-22 ENCOUNTER — Telehealth: Payer: Self-pay

## 2016-03-22 DIAGNOSIS — H52203 Unspecified astigmatism, bilateral: Secondary | ICD-10-CM | POA: Diagnosis not present

## 2016-03-22 DIAGNOSIS — H2513 Age-related nuclear cataract, bilateral: Secondary | ICD-10-CM | POA: Diagnosis not present

## 2016-03-22 NOTE — Telephone Encounter (Signed)
Left message with wife to have pt call back

## 2016-03-22 NOTE — Telephone Encounter (Signed)
Pt is aware and agreeable to normal results  

## 2016-04-01 ENCOUNTER — Telehealth: Payer: Self-pay | Admitting: Cardiology

## 2016-04-01 NOTE — Telephone Encounter (Signed)
Spoke w/ pt and requested that he send a manual transmission b/c his home monitor has not updated in at least 8 days.   

## 2016-04-08 ENCOUNTER — Telehealth: Payer: Self-pay | Admitting: Cardiology

## 2016-04-08 NOTE — Telephone Encounter (Signed)
Attempted to call pt and request a manual transmission b/c his home monitor has not updated in the last 7 days. No answer and unable to leave a message.

## 2016-04-21 ENCOUNTER — Telehealth: Payer: Self-pay | Admitting: Cardiology

## 2016-04-21 NOTE — Telephone Encounter (Signed)
Attempted to call pt b/c his home monitor has not updated in the last 7 days. No answer and unable to leave a message.   

## 2016-05-05 ENCOUNTER — Telehealth: Payer: Self-pay | Admitting: Cardiology

## 2016-05-05 NOTE — Telephone Encounter (Signed)
Attempted to call pt b/c his home monitor has not updated in the last 7 days. No answer and unable to leave a message.   

## 2016-05-12 ENCOUNTER — Telehealth: Payer: Self-pay | Admitting: Cardiology

## 2016-05-12 NOTE — Telephone Encounter (Signed)
Spoke w/ pt and requested that he send a manual transmission b/c his home monitor has not updated in at least 7 days.   

## 2016-06-03 ENCOUNTER — Telehealth: Payer: Self-pay | Admitting: Cardiology

## 2016-06-03 NOTE — Telephone Encounter (Signed)
Attempted to call pt b/c his home monitor has not updated in the last 7 days. No answer and unable to leave a message.   

## 2016-06-13 ENCOUNTER — Encounter: Payer: Medicare Other | Admitting: *Deleted

## 2016-06-13 ENCOUNTER — Telehealth: Payer: Self-pay | Admitting: Cardiology

## 2016-06-13 NOTE — Telephone Encounter (Signed)
Spoke with pt and reminded pt of remote transmission that is due today. Pt verbalized understanding and said once his phone is fixed he will send the transmission.

## 2016-06-29 ENCOUNTER — Ambulatory Visit (INDEPENDENT_AMBULATORY_CARE_PROVIDER_SITE_OTHER): Payer: Medicare Other | Admitting: *Deleted

## 2016-06-29 DIAGNOSIS — I255 Ischemic cardiomyopathy: Secondary | ICD-10-CM

## 2016-07-01 ENCOUNTER — Encounter: Payer: Self-pay | Admitting: Cardiology

## 2016-07-01 NOTE — Progress Notes (Signed)
Remote ICD transmission.   

## 2016-07-02 LAB — CUP PACEART REMOTE DEVICE CHECK
Battery Remaining Longevity: 62 mo
Brady Statistic AP VS Percent: 94 %
Brady Statistic AS VS Percent: 3 %
Date Time Interrogation Session: 20180207210309
HIGH POWER IMPEDANCE MEASURED VALUE: 35 Ohm
HIGH POWER IMPEDANCE MEASURED VALUE: 35 Ohm
Implantable Lead Implant Date: 20080324
Implantable Lead Location: 753859
Lead Channel Pacing Threshold Amplitude: 0.5 V
Lead Channel Sensing Intrinsic Amplitude: 1.4 mV
Lead Channel Setting Pacing Pulse Width: 0.7 ms
Lead Channel Setting Sensing Sensitivity: 0.5 mV
MDC IDC LEAD IMPLANT DT: 20080324
MDC IDC LEAD LOCATION: 753860
MDC IDC MSMT BATTERY REMAINING PERCENTAGE: 69 %
MDC IDC MSMT BATTERY VOLTAGE: 2.96 V
MDC IDC MSMT LEADCHNL RA IMPEDANCE VALUE: 360 Ohm
MDC IDC MSMT LEADCHNL RA PACING THRESHOLD PULSEWIDTH: 0.5 ms
MDC IDC MSMT LEADCHNL RV IMPEDANCE VALUE: 310 Ohm
MDC IDC MSMT LEADCHNL RV PACING THRESHOLD AMPLITUDE: 1.25 V
MDC IDC MSMT LEADCHNL RV PACING THRESHOLD PULSEWIDTH: 0.7 ms
MDC IDC MSMT LEADCHNL RV SENSING INTR AMPL: 8.7 mV
MDC IDC PG IMPLANT DT: 20150518
MDC IDC SET LEADCHNL RA PACING AMPLITUDE: 2 V
MDC IDC SET LEADCHNL RV PACING AMPLITUDE: 2.5 V
MDC IDC STAT BRADY AP VP PERCENT: 1.1 %
MDC IDC STAT BRADY AS VP PERCENT: 1 %
MDC IDC STAT BRADY RA PERCENT PACED: 92 %
MDC IDC STAT BRADY RV PERCENT PACED: 1.2 %
Pulse Gen Serial Number: 7194807

## 2016-08-02 ENCOUNTER — Telehealth: Payer: Self-pay

## 2016-08-02 NOTE — Telephone Encounter (Signed)
Spoke with pt regarding ATP episode from 3/12, pt stated that he felt like he just received a shock from his defibrillator, pt stated that he was feeling ok and had taken his amiodarone and lopressor.  Pt stated that he is currently living  in [place] and has not found a cardiologist. Informed pt that he should go to the closest  emergency room and inform them that he received a shock, pt voiced understanding.

## 2016-08-29 ENCOUNTER — Other Ambulatory Visit: Payer: Self-pay | Admitting: Internal Medicine

## 2016-08-29 DIAGNOSIS — E785 Hyperlipidemia, unspecified: Secondary | ICD-10-CM

## 2016-09-07 ENCOUNTER — Telehealth: Payer: Self-pay | Admitting: *Deleted

## 2016-09-07 NOTE — Telephone Encounter (Signed)
Attempted to call patient x 1--N/A, unable to LM.  Persistent AF since ~08/15/16.

## 2016-09-07 NOTE — Telephone Encounter (Signed)
Informed patient that he's been in AF. Patient states that he "feels great". I told him to make sure that he continues to take the warfarin. Patient voiced understanding.  He said that he has an appt with an EP in [place] on 5/18. I told him to call back after he sees the EP, so that his information can be released in [place]. Again, patient verbalized understanding.

## 2016-09-07 NOTE — Telephone Encounter (Signed)
Follow up   Pt is returning call    

## 2016-09-07 NOTE — Telephone Encounter (Signed)
Attempted to call patient x 1--N/A, unable to LM

## 2016-10-25 ENCOUNTER — Telehealth: Payer: Self-pay | Admitting: Cardiology

## 2016-10-25 NOTE — Telephone Encounter (Signed)
Spoke w/ pt and he informed me that he is still living in [place] and that he has a new cardiologist and he has had a new device implanted on 10-15-2016. Informed pt that I would make a note. Pt verbalized understanding.

## 2016-11-18 ENCOUNTER — Other Ambulatory Visit: Payer: Self-pay | Admitting: Internal Medicine

## 2016-11-19 ENCOUNTER — Other Ambulatory Visit: Payer: Self-pay | Admitting: Internal Medicine

## 2016-11-28 ENCOUNTER — Other Ambulatory Visit: Payer: Self-pay | Admitting: Internal Medicine

## 2017-01-16 ENCOUNTER — Other Ambulatory Visit: Payer: Self-pay | Admitting: Internal Medicine

## 2017-01-16 NOTE — Telephone Encounter (Signed)
Medication Detail    Disp Refills Start End   ramipril (ALTACE) 5 MG capsule 90 capsule 0 11/28/2016    Sig: TAKE 1 CAPSULE BY MOUTH DAILY   Sent to pharmacy as: ramipril (ALTACE) 5 MG capsule   Notes to Pharmacy: Please call our office to schedule an yearly appointment with Dr. Graciela Husbands for October for future refills. [phone]. Thank you 1st attempt   E-Prescribing Status: Receipt confirmed by pharmacy (11/28/2016 4:13 PM EDT)   Pharmacy   [facility] - [place], [place] - [address]

## 2017-02-19 ENCOUNTER — Other Ambulatory Visit: Payer: Self-pay | Admitting: Internal Medicine
